# Patient Record
Sex: Female | Born: 1950 | Race: White | Hispanic: No | Smoking: Former smoker
Health system: Southern US, Community
[De-identification: ages and names within clinical notes are randomized; demographics above are authoritative.]

## PROBLEM LIST (undated history)

## (undated) DIAGNOSIS — I10 Essential (primary) hypertension: Secondary | ICD-10-CM

## (undated) DIAGNOSIS — R112 Nausea with vomiting, unspecified: Secondary | ICD-10-CM

## (undated) DIAGNOSIS — F32A Depression, unspecified: Secondary | ICD-10-CM

## (undated) DIAGNOSIS — Z9889 Other specified postprocedural states: Secondary | ICD-10-CM

## (undated) DIAGNOSIS — K219 Gastro-esophageal reflux disease without esophagitis: Secondary | ICD-10-CM

## (undated) DIAGNOSIS — M810 Age-related osteoporosis without current pathological fracture: Secondary | ICD-10-CM

## (undated) DIAGNOSIS — G43909 Migraine, unspecified, not intractable, without status migrainosus: Secondary | ICD-10-CM

## (undated) DIAGNOSIS — F329 Major depressive disorder, single episode, unspecified: Secondary | ICD-10-CM

## (undated) DIAGNOSIS — F419 Anxiety disorder, unspecified: Secondary | ICD-10-CM

## (undated) DIAGNOSIS — E785 Hyperlipidemia, unspecified: Secondary | ICD-10-CM

## (undated) DIAGNOSIS — Z972 Presence of dental prosthetic device (complete) (partial): Secondary | ICD-10-CM

## (undated) DIAGNOSIS — S82899A Other fracture of unspecified lower leg, initial encounter for closed fracture: Secondary | ICD-10-CM

## (undated) HISTORY — PX: MANDIBLE SURGERY: SHX707

## (undated) HISTORY — DX: Major depressive disorder, single episode, unspecified: F32.9

## (undated) HISTORY — DX: Anxiety disorder, unspecified: F41.9

## (undated) HISTORY — DX: Hyperlipidemia, unspecified: E78.5

## (undated) HISTORY — DX: Other fracture of unspecified lower leg, initial encounter for closed fracture: S82.899A

## (undated) HISTORY — DX: Migraine, unspecified, not intractable, without status migrainosus: G43.909

## (undated) HISTORY — DX: Age-related osteoporosis without current pathological fracture: M81.0

## (undated) HISTORY — DX: Essential (primary) hypertension: I10

## (undated) HISTORY — PX: ANKLE FRACTURE SURGERY: SHX122

## (undated) HISTORY — DX: Depression, unspecified: F32.A

---

## 2004-12-01 ENCOUNTER — Ambulatory Visit: Payer: Self-pay | Admitting: Internal Medicine

## 2008-08-24 ENCOUNTER — Ambulatory Visit: Payer: Self-pay | Admitting: Gastroenterology

## 2012-01-24 ENCOUNTER — Ambulatory Visit: Payer: Self-pay

## 2013-05-13 ENCOUNTER — Ambulatory Visit: Payer: Self-pay | Admitting: Family Medicine

## 2013-12-15 ENCOUNTER — Ambulatory Visit: Payer: Self-pay | Admitting: Ophthalmology

## 2013-12-15 DIAGNOSIS — Z01810 Encounter for preprocedural cardiovascular examination: Secondary | ICD-10-CM

## 2013-12-15 DIAGNOSIS — I10 Essential (primary) hypertension: Secondary | ICD-10-CM

## 2013-12-15 LAB — POTASSIUM: Potassium: 3.6 mmol/L (ref 3.5–5.1)

## 2014-01-02 HISTORY — PX: CATARACT EXTRACTION: SUR2

## 2014-01-06 ENCOUNTER — Ambulatory Visit: Payer: Self-pay | Admitting: Ophthalmology

## 2014-02-04 ENCOUNTER — Ambulatory Visit: Payer: Self-pay | Admitting: Gastroenterology

## 2014-02-04 HISTORY — PX: COLONOSCOPY: SHX174

## 2014-06-25 NOTE — Op Note (Signed)
PATIENT NAME:  Tabitha Larson, Tabitha Larson MR#:  [id number] DATE OF BIRTH:  [birth year]  DATE OF PROCEDURE:  01/06/2014  PREOPERATIVE DIAGNOSIS:  Nuclear sclerotic cataract, right eye. H25.11  POSTOPERATIVE DIAGNOSIS:  Nuclear sclerotic cataract, right eye.  PROCEDURE:  Phacoemulsification with posterior chamber intraocular lens right eye, model SN60WF  SURGEON:  Lyla Glassing, MD  INDICATIONS:  This is a 64 year old female with decreased vision in the right eye.  PROCEDURE:  The risks and benefits of cataract surgery were discussed at length with the patient, including bleeding, infection, retinal detachment, re-operation, diplopia, ptosis, loss of vision, and loss of the eye. Informed consent was obtained. On the day of surgery, several sets of preoperative medication were administered to the operative eye including 0.5% tetracaine, 1% cyclopentolate, 10% phenylephrine, 0.5% ketorolac, 0.5% gatifloxacin, and 2% lidocaine .  The patient was taken to the operating room and sedated via IV sedation. Topical tetracaine was placed in the eye. The operative eye was prepped using a 10% Betadine solution and then covered in sterile drapes leaving only the operative eye exposed. A Lieberman lid speculum was placed to provide exposure. Using 0.12 forceps and a sideport blade, a paracentesis was created. Then a mixture of BSS, preservative free lidocaine, and epinephrine was injected into the anterior chamber. Next, a 2.4 mm keratome blade was used to create a two-step full-thickness clear corneal incision temporally. The cystitome and Utrata forceps were used to create a continuous capsulorrhexis in the anterior lens capsule. BSS on a hydrodissection cannula was used to perform gentle hydrodissection. Phacoemulsification was then performed to remove the nucleus. Irrigation and aspiration was performed to remove the remaining cortical material. Provisc was injected to fill the capsular bag and anterior chamber. An 18.0  diopter SN60WF intraocular lens was injected into the capsular bag. The Connor wand was used to rotate it into proper position in the capsular bag. Irrigation and aspiration was performed to remove the remaining Viscoelastic material from the eye. BSS on a 30-gauge cannula was used to hydrate the wound. An intracameral antibiotic was administered. The wounds were checked and found to be watertight. The lid speculum and drapes were carefully removed. Several drops of Vigamox were placed in the operative eye. The eye was covered with protective eyewear. The patient was taken to the recovery area in good condition. There were no complications.   ____________________________ Lyla Glassing, MD nm:lt D: 01/06/2014 11:37:05 ET T: 01/06/2014 13:43:46 ET JOB#: [id number]  cc: Lyla Glassing, MD, <Dictator> Lyla Glassing MD ELECTRONICALLY SIGNED 01/13/2014 13:45

## 2014-06-30 ENCOUNTER — Encounter: Payer: Self-pay | Admitting: Unknown Physician Specialty

## 2014-06-30 DIAGNOSIS — E7800 Pure hypercholesterolemia, unspecified: Secondary | ICD-10-CM

## 2014-06-30 DIAGNOSIS — M810 Age-related osteoporosis without current pathological fracture: Secondary | ICD-10-CM | POA: Insufficient documentation

## 2014-06-30 DIAGNOSIS — I10 Essential (primary) hypertension: Secondary | ICD-10-CM | POA: Insufficient documentation

## 2014-06-30 DIAGNOSIS — F332 Major depressive disorder, recurrent severe without psychotic features: Secondary | ICD-10-CM

## 2014-06-30 DIAGNOSIS — G43909 Migraine, unspecified, not intractable, without status migrainosus: Secondary | ICD-10-CM | POA: Insufficient documentation

## 2014-06-30 DIAGNOSIS — G43709 Chronic migraine without aura, not intractable, without status migrainosus: Secondary | ICD-10-CM

## 2014-06-30 DIAGNOSIS — M8580 Other specified disorders of bone density and structure, unspecified site: Secondary | ICD-10-CM | POA: Insufficient documentation

## 2014-06-30 DIAGNOSIS — F329 Major depressive disorder, single episode, unspecified: Secondary | ICD-10-CM | POA: Insufficient documentation

## 2014-08-15 ENCOUNTER — Ambulatory Visit: Payer: Self-pay | Admitting: Unknown Physician Specialty

## 2014-12-20 ENCOUNTER — Telehealth: Payer: Self-pay

## 2014-12-20 NOTE — Telephone Encounter (Signed)
Called patient about colorectal screening and eye exam. Patient did not answer. Left voicemail.

## 2015-01-24 ENCOUNTER — Ambulatory Visit (INDEPENDENT_AMBULATORY_CARE_PROVIDER_SITE_OTHER): Payer: Medicare Other | Admitting: Unknown Physician Specialty

## 2015-01-24 ENCOUNTER — Encounter: Payer: Self-pay | Admitting: Unknown Physician Specialty

## 2015-01-24 VITALS — BP 114/72 | HR 65 | Temp 98.3°F | Ht 61.1 in | Wt 145.0 lb

## 2015-01-24 DIAGNOSIS — E7800 Pure hypercholesterolemia, unspecified: Secondary | ICD-10-CM | POA: Diagnosis not present

## 2015-01-24 DIAGNOSIS — Z23 Encounter for immunization: Secondary | ICD-10-CM | POA: Diagnosis not present

## 2015-01-24 DIAGNOSIS — I10 Essential (primary) hypertension: Secondary | ICD-10-CM

## 2015-01-24 DIAGNOSIS — F332 Major depressive disorder, recurrent severe without psychotic features: Secondary | ICD-10-CM | POA: Diagnosis not present

## 2015-01-24 DIAGNOSIS — R5383 Other fatigue: Secondary | ICD-10-CM | POA: Diagnosis not present

## 2015-01-24 LAB — LIPID PANEL PICCOLO, WAIVED
CHOLESTEROL PICCOLO, WAIVED: 185 mg/dL (ref <200)
Chol/HDL Ratio Piccolo,Waive: 4.3 mg/dL
HDL Chol Piccolo, Waived: 43 mg/dL — ABNORMAL LOW (ref >59)
LDL Chol Calc Piccolo Waived: 115 mg/dL — ABNORMAL HIGH (ref <100)
TRIGLYCERIDES PICCOLO,WAIVED: 133 mg/dL (ref <150)
VLDL Chol Calc Piccolo,Waive: 27 mg/dL (ref <30)

## 2015-01-24 LAB — MICROALBUMIN, URINE WAIVED
CREATININE, URINE WAIVED: 100 mg/dL (ref 10–300)
Microalb, Ur Waived: 30 mg/L — ABNORMAL HIGH (ref 0–19)

## 2015-01-24 MED ORDER — ATORVASTATIN CALCIUM 40 MG PO TABS: 40.0000 mg | ORAL_TABLET | Freq: Every day | ORAL | Status: DC

## 2015-01-24 MED ORDER — SIMVASTATIN 40 MG PO TABS: 40.0000 mg | ORAL_TABLET | Freq: Every day | ORAL | Status: DC

## 2015-01-24 MED ORDER — HYDROCHLOROTHIAZIDE 25 MG PO TABS: 25.0000 mg | ORAL_TABLET | Freq: Every day | ORAL | Status: DC

## 2015-01-24 MED ORDER — DULOXETINE HCL 30 MG PO CPEP: 30.0000 mg | ORAL_CAPSULE | Freq: Every day | ORAL | Status: DC

## 2015-01-24 MED ORDER — AMLODIPINE BESYLATE 5 MG PO TABS: 5.0000 mg | ORAL_TABLET | Freq: Every day | ORAL | Status: DC

## 2015-01-24 NOTE — Assessment & Plan Note (Addendum)
LDL is 114.  Change to Atorvastatin 40 mg due to being on Amlodipine and drug drug interactions

## 2015-01-24 NOTE — Progress Notes (Signed)
BP 114/72 mmHg  Pulse 65  Temp(Src) 98.3 F (36.8 C)  Ht 5' 1.1" (1.552 m)  Wt 145 lb (65.772 kg)  BMI 27.31 kg/m2  SpO2 97%  LMP  (LMP Unknown)   Subjective:    Patient ID: Tabitha Larson, female    DOB: 1950/11/08, 64 y.o.   MRN: [id number]  HPI: [name] is a 64 y.o. female  Chief Complaint  Patient presents with  . Depression  . Hyperlipidemia  . Hypertension  . Fatigue    pt states she feels tired all the time and it is hard for her to wake up  . Medication Refill    pt states she needs refills on all medications   Hypertension Using medications without difficulty Average home BPs   No problems or lightheadedness No chest pain with exertion or shortness of breath No Edema   Hyperlipidemia Using medications without problems: No Muscle aches  Diet compliance: good Exercise doesn't have the energy  Depression "I feel a lot better" but bothered by excessive fatigue.  Sleepy during the day.  She thinks she snores at night.  Sometimes wakes up not being able to breath.  Wakes up with a headache.  Goes away after a few minutes.    Relevant past medical, surgical, family and social history reviewed and updated as indicated. Interim medical history since our last visit reviewed. Allergies and medications reviewed and updated.  Review of Systems  Per HPI unless specifically indicated above     Objective:    BP 114/72 mmHg  Pulse 65  Temp(Src) 98.3 F (36.8 C)  Ht 5' 1.1" (1.552 m)  Wt 145 lb (65.772 kg)  BMI 27.31 kg/m2  SpO2 97%  LMP  (LMP Unknown)  Wt Readings from Last 3 Encounters:  01/24/15 145 lb (65.772 kg)  02/11/14 136 lb (61.689 kg)    Physical Exam  Constitutional: She is oriented to person, place, and time. She appears well-developed and well-nourished. No distress.  HENT:  Head: Normocephalic and atraumatic.  Eyes: Conjunctivae and lids are normal. Right eye exhibits no discharge. Left eye exhibits no discharge. No scleral  icterus.  Cardiovascular: Normal rate and regular rhythm.   Pulmonary/Chest: Effort normal and breath sounds normal. No respiratory distress.  Abdominal: Soft. Normal appearance and bowel sounds are normal. There is no splenomegaly or hepatomegaly.  Musculoskeletal: Normal range of motion.  Neurological: She is alert and oriented to person, place, and time.  Skin: Skin is intact. No rash noted. No pallor.  Psychiatric: She has a normal mood and affect. Her behavior is normal. Judgment and thought content normal.    Results for orders placed or performed in visit on 12/15/13  Potassium  Result Value Ref Range   Potassium 3.6 3.5-5.1 mmol/L      Assessment & Plan:   Problem List Items Addressed This Visit      Unprioritized   Major depression ([place])    Improved.  Continue Cymbalta      Relevant Medications   DULoxetine (CYMBALTA) 30 MG capsule   Benign hypertension - Primary    Stable, continue present medications.       Relevant Medications   amLODipine (NORVASC) 5 MG tablet   hydrochlorothiazide (HYDRODIURIL) 25 MG tablet   atorvastatin (LIPITOR) 40 MG tablet   Other Relevant Orders   Lipid Panel Piccolo, Waived   Microalbumin, Urine Waived   Uric acid   Comprehensive metabolic panel   Hypercholesterolemia    LDL is  114.  Change to Atorvastatin 40 mg due to being on Amlodipine and drug drug interactions      Relevant Medications   amLODipine (NORVASC) 5 MG tablet   hydrochlorothiazide (HYDRODIURIL) 25 MG tablet   atorvastatin (LIPITOR) 40 MG tablet   Other Relevant Orders   Lipid Panel Piccolo, Waived   Microalbumin, Urine Waived   Uric acid   Comprehensive metabolic panel    Other Visit Diagnoses    Immunization due        Relevant Orders    Flu Vaccine QUAD 36+ mos IM (Completed)    Other fatigue        Suspect sleep apnea.  Refer for sleep study    Relevant Orders    Ambulatory referral to Sleep Studies        Follow up plan: Return in about 3  months (around 04/26/2015) for physical.

## 2015-01-24 NOTE — Patient Instructions (Signed)
Stop Simvastatin and start Atorvastatin Check about sleep study

## 2015-01-24 NOTE — Assessment & Plan Note (Addendum)
Improved.  Continue Cymbalta. 

## 2015-01-24 NOTE — Assessment & Plan Note (Signed)
Stable, continue present medications.   

## 2015-01-25 ENCOUNTER — Encounter: Payer: Self-pay | Admitting: Unknown Physician Specialty

## 2015-01-25 LAB — COMPREHENSIVE METABOLIC PANEL
A/G RATIO: 2 (ref 1.1–2.5)
ALBUMIN: 4.6 g/dL (ref 3.6–4.8)
ALK PHOS: 64 IU/L (ref 39–117)
ALT: 18 IU/L (ref 0–32)
AST: 17 IU/L (ref 0–40)
BUN / CREAT RATIO: 26 (ref 11–26)
BUN: 15 mg/dL (ref 8–27)
Bilirubin Total: 0.2 mg/dL (ref 0.0–1.2)
CO2: 26 mmol/L (ref 18–29)
Calcium: 9.6 mg/dL (ref 8.7–10.3)
Chloride: 100 mmol/L (ref 97–106)
Creatinine, Ser: 0.58 mg/dL (ref 0.57–1.00)
GFR calc non Af Amer: 96 mL/min/{1.73_m2} (ref >59)
GFR, EST AFRICAN AMERICAN: 111 mL/min/{1.73_m2} (ref >59)
Globulin, Total: 2.3 g/dL (ref 1.5–4.5)
Glucose: 99 mg/dL (ref 65–99)
Potassium: 3.9 mmol/L (ref 3.5–5.2)
SODIUM: 141 mmol/L (ref 136–144)
TOTAL PROTEIN: 6.9 g/dL (ref 6.0–8.5)

## 2015-01-25 LAB — URIC ACID: Uric Acid: 4.2 mg/dL (ref 2.5–7.1)

## 2015-01-25 NOTE — Progress Notes (Signed)
Quick Note:  Normal labs. Patient notified by letter. ______ 

## 2015-05-01 ENCOUNTER — Ambulatory Visit (INDEPENDENT_AMBULATORY_CARE_PROVIDER_SITE_OTHER): Payer: Medicare Other | Admitting: Unknown Physician Specialty

## 2015-05-01 ENCOUNTER — Encounter: Payer: Self-pay | Admitting: Unknown Physician Specialty

## 2015-05-01 VITALS — BP 125/77 | HR 64 | Temp 97.6°F | Ht 61.1 in | Wt 142.0 lb

## 2015-05-01 DIAGNOSIS — I10 Essential (primary) hypertension: Secondary | ICD-10-CM

## 2015-05-01 DIAGNOSIS — Z780 Asymptomatic menopausal state: Secondary | ICD-10-CM

## 2015-05-01 DIAGNOSIS — E7800 Pure hypercholesterolemia, unspecified: Secondary | ICD-10-CM

## 2015-05-01 DIAGNOSIS — Z0000 Encounter for general adult medical examination without abnormal findings: Secondary | ICD-10-CM

## 2015-05-01 DIAGNOSIS — Z23 Encounter for immunization: Secondary | ICD-10-CM

## 2015-05-01 DIAGNOSIS — F332 Major depressive disorder, recurrent severe without psychotic features: Secondary | ICD-10-CM | POA: Diagnosis not present

## 2015-05-01 LAB — MICROALBUMIN, URINE WAIVED
CREATININE, URINE WAIVED: 200 mg/dL (ref 10–300)
Microalb, Ur Waived: 30 mg/L — ABNORMAL HIGH (ref 0–19)

## 2015-05-01 NOTE — Patient Instructions (Signed)
Please do call to schedule your mammogram; the number to schedule one at either [facility] or [facility] is [phone]   

## 2015-05-01 NOTE — Assessment & Plan Note (Signed)
Stable, continue present medications.   

## 2015-05-01 NOTE — Progress Notes (Signed)
BP 125/77 mmHg  Pulse 64  Temp(Src) 97.6 F (36.4 C)  Ht 5' 1.1" (1.552 m)  Wt 142 lb (64.411 kg)  BMI 26.74 kg/m2  SpO2 97%  LMP  (LMP Unknown)   Subjective:    Patient ID: Tabitha Larson, female    DOB: 1950/07/31, 65 y.o.   MRN: [id number]  HPI: Tabitha Larson is a 65 y.o. female  Chief Complaint  Patient presents with  . Medicare Wellness   Functional Status Survey: Is the patient deaf or have difficulty hearing?: No Does the patient have difficulty seeing, even when wearing glasses/contacts?: Yes (pt states she has had a cataract surgery, states she wears glasses to read and drive) Does the patient have difficulty concentrating, remembering, or making decisions?: Yes (pt states she has trouble concentrating sometimes) Does the patient have difficulty walking or climbing stairs?: No Does the patient have difficulty dressing or bathing?: No Does the patient have difficulty doing errands alone such as visiting a doctor's office or shopping?: No Fall Risk  05/01/2015 01/24/2015  Falls in the past year? No No   Depression screen [facility] 2/9 05/01/2015 01/24/2015  Decreased Interest 1 1  Down, Depressed, Hopeless 1 1  PHQ - 2 Score 2 2    Past Medical History  Diagnosis Date  . Hypertension   . Hyperlipidemia   . Depression   . Osteoporosis   . Migraine   . Anxiety   . Ankle fracture    Past Surgical History  Procedure Laterality Date  . Cataract extraction Right 01/2014  . Mandible surgery    . Ankle fracture surgery    . Colonoscopy  02/04/2014   Family History  Problem Relation Age of Onset  . Stroke Mother   . Cancer Father     Prostate  . Cancer Sister     colon  . Hypertension Sister   . Hyperlipidemia Sister    Hypertension Using medications without difficulty Average home BPs not checking  No problems or lightheadedness  No chest pain with exertion or shortness of breath No Edema   Hyperlipidemia Using medications without problems: No  Muscle aches  Diet compliance: Eating too much junk food.   Exercise: "not really"  Pt is able to perform complex mental tasks, recognize clock face, recognize time and do a 3 item recall.    Pt is planning to make advanced directions.  She would like her sister or her niece to make health care decisions.   Relevant past medical, surgical, family and social history reviewed and updated as indicated. Interim medical history since our last visit reviewed. Allergies and medications reviewed and updated.  Review of Systems  Constitutional: Negative.   HENT: Negative.   Eyes: Negative.   Respiratory: Negative.   Cardiovascular: Negative.   Gastrointestinal: Negative.        Noticed frequency and form changed.  Colonoscopy 1 year ago  Endocrine: Negative.   Genitourinary: Negative.   Musculoskeletal: Negative.   Skin: Negative.   Allergic/Immunologic: Negative.   Neurological: Negative.   Hematological: Negative.   Psychiatric/Behavioral: Negative.     Per HPI unless specifically indicated above     Objective:    BP 125/77 mmHg  Pulse 64  Temp(Src) 97.6 F (36.4 C)  Ht 5' 1.1" (1.552 m)  Wt 142 lb (64.411 kg)  BMI 26.74 kg/m2  SpO2 97%  LMP  (LMP Unknown)  Wt Readings from Last 3 Encounters:  05/01/15 142 lb (64.411 kg)  01/24/15 145  lb (65.772 kg)  02/11/14 136 lb (61.689 kg)    Physical Exam  Constitutional: She is oriented to person, place, and time. She appears well-developed and well-nourished.  HENT:  Head: Normocephalic and atraumatic.  Eyes: Pupils are equal, round, and reactive to light. Right eye exhibits no discharge. Left eye exhibits no discharge. No scleral icterus.  Neck: Normal range of motion. Neck supple. Carotid bruit is not present. No thyromegaly present.  Cardiovascular: Normal rate, regular rhythm and normal heart sounds.  Exam reveals no gallop and no friction rub.   No murmur heard. Pulmonary/Chest: Effort normal and breath sounds normal. No  respiratory distress. She has no wheezes. She has no rales.  Abdominal: Soft. Bowel sounds are normal. There is no tenderness. There is no rebound.  Genitourinary: No breast swelling, tenderness or discharge.  Musculoskeletal: Normal range of motion.  Lymphadenopathy:    She has no cervical adenopathy.  Neurological: She is alert and oriented to person, place, and time.  Skin: Skin is warm, dry and intact. No rash noted.  Psychiatric: She has a normal mood and affect. Her speech is normal and behavior is normal. Judgment and thought content normal. Cognition and memory are normal.    Results for orders placed or performed in visit on 01/24/15  Lipid Panel Piccolo, [employer]  Result Value Ref Range   Cholesterol Piccolo, Waived 185 <200 mg/dL   HDL Chol Piccolo, Waived 43 (L) >59 mg/dL   Triglycerides Piccolo,Waived 133 <150 mg/dL   Chol/HDL Ratio Piccolo,Waive 4.3 mg/dL   LDL Chol Calc Piccolo Waived 115 (H) <100 mg/dL   VLDL Chol Calc Piccolo,Waive 27 <30 mg/dL  Microalbumin, Urine Waived  Result Value Ref Range   Microalb, Ur Waived 30 (H) 0 - 19 mg/L   Creatinine, Urine Waived 100 10 - 300 mg/dL   Microalb/Creat Ratio 30-300 (H) <30 mg/g  Uric acid  Result Value Ref Range   Uric Acid 4.2 2.5 - 7.1 mg/dL  Comprehensive metabolic panel  Result Value Ref Range   Glucose 99 65 - 99 mg/dL   BUN 15 8 - 27 mg/dL   Creatinine, Ser 0.58 0.57 - 1.00 mg/dL   GFR calc non Af Amer 96 >59 mL/min/1.73   GFR calc Af Amer 111 >59 mL/min/1.73   BUN/Creatinine Ratio 26 11 - 26   Sodium 141 136 - 144 mmol/L   Potassium 3.9 3.5 - 5.2 mmol/L   Chloride 100 97 - 106 mmol/L   CO2 26 18 - 29 mmol/L   Calcium 9.6 8.7 - 10.3 mg/dL   Total Protein 6.9 6.0 - 8.5 g/dL   Albumin 4.6 3.6 - 4.8 g/dL   Globulin, Total 2.3 1.5 - 4.5 g/dL   Albumin/Globulin Ratio 2.0 1.1 - 2.5   Bilirubin Total 0.2 0.0 - 1.2 mg/dL   Alkaline Phosphatase 64 39 - 117 IU/L   AST 17 0 - 40 IU/L   ALT 18 0 - 32 IU/L       Assessment & Plan:   Problem List Items Addressed This Visit      Unprioritized   Major depression ([facility])    Stable, continue present medications.        Benign hypertension    Stable, continue present medications.        Relevant Orders   Comprehensive metabolic panel   Microalbumin, Urine Waived   Hypercholesterolemia    Stable, continue present medications.        Relevant Orders   Lipid Panel w/o  Chol/HDL Ratio    Other Visit Diagnoses    Need for pneumococcal vaccination    -  Primary    Relevant Orders    Pneumococcal polysaccharide vaccine 23-valent greater than or equal to 2yo subcutaneous/IM (Completed)    Annual physical exam        Relevant Orders    Hepatitis C antibody    Menopause        Relevant Orders    DG Bone Density       After discussion with patient she would like to be a no code and have her sister and niece make medical decisions for her  Follow up plan: Return in about 6 months (around 10/29/2015).

## 2015-05-02 ENCOUNTER — Encounter: Payer: Self-pay | Admitting: Unknown Physician Specialty

## 2015-05-02 LAB — COMPREHENSIVE METABOLIC PANEL
A/G RATIO: 1.7 (ref 1.1–2.5)
ALT: 19 IU/L (ref 0–32)
AST: 15 IU/L (ref 0–40)
Albumin: 4.3 g/dL (ref 3.6–4.8)
Alkaline Phosphatase: 79 IU/L (ref 39–117)
BILIRUBIN TOTAL: 0.3 mg/dL (ref 0.0–1.2)
BUN / CREAT RATIO: 23 (ref 11–26)
BUN: 15 mg/dL (ref 8–27)
CHLORIDE: 99 mmol/L (ref 96–106)
CO2: 28 mmol/L (ref 18–29)
Calcium: 9.8 mg/dL (ref 8.7–10.3)
Creatinine, Ser: 0.66 mg/dL (ref 0.57–1.00)
GFR calc Af Amer: 106 mL/min/{1.73_m2} (ref >59)
GFR calc non Af Amer: 92 mL/min/{1.73_m2} (ref >59)
Globulin, Total: 2.6 g/dL (ref 1.5–4.5)
Glucose: 97 mg/dL (ref 65–99)
POTASSIUM: 3.9 mmol/L (ref 3.5–5.2)
SODIUM: 144 mmol/L (ref 134–144)
Total Protein: 6.9 g/dL (ref 6.0–8.5)

## 2015-05-02 LAB — LIPID PANEL W/O CHOL/HDL RATIO
Cholesterol, Total: 172 mg/dL (ref 100–199)
HDL: 44 mg/dL (ref >39)
LDL Calculated: 109 mg/dL — ABNORMAL HIGH (ref 0–99)
Triglycerides: 97 mg/dL (ref 0–149)
VLDL CHOLESTEROL CAL: 19 mg/dL (ref 5–40)

## 2015-05-02 LAB — HEPATITIS C ANTIBODY

## 2015-06-13 ENCOUNTER — Telehealth: Payer: Self-pay | Admitting: Unknown Physician Specialty

## 2015-06-13 NOTE — Telephone Encounter (Signed)
Pt would like to go to gastro for irregular bowel movements. She had a colonoscopy done 02/2015.

## 2015-06-14 ENCOUNTER — Ambulatory Visit (INDEPENDENT_AMBULATORY_CARE_PROVIDER_SITE_OTHER): Payer: Medicare Other | Admitting: Unknown Physician Specialty

## 2015-06-14 ENCOUNTER — Encounter: Payer: Self-pay | Admitting: Unknown Physician Specialty

## 2015-06-14 ENCOUNTER — Telehealth: Payer: Self-pay | Admitting: Gastroenterology

## 2015-06-14 VITALS — BP 120/74 | HR 62 | Temp 97.9°F | Ht 61.0 in | Wt 143.4 lb

## 2015-06-14 DIAGNOSIS — K589 Irritable bowel syndrome without diarrhea: Secondary | ICD-10-CM | POA: Diagnosis not present

## 2015-06-14 NOTE — Progress Notes (Signed)
   BP 120/74 mmHg  Pulse 62  Temp(Src) 97.9 F (36.6 C)  Ht 5\' 1"  (1.549 m)  Wt 143 lb 6.4 oz (65.046 kg)  BMI 27.11 kg/m2  SpO2 97%  LMP  (LMP Unknown)   Subjective:    Patient ID: Tabitha Larson, female    DOB: Apr 11, 1950, 65 y.o.   MRN: [id number]  HPI: Tabitha Larson is a 65 y.o. female  Chief Complaint  Patient presents with  . irregular bowel movements    pt states she has been having constipation and diarrhea for a few months now. She states there is not much in between the diarrhea and constipation    Constipation This is a new problem. The current episode started more than 1 month ago. The problem is unchanged. Her stool frequency is 2 to 3 times per week. The stool is described as loose and pencil thin. The patient is not on a high fiber diet. She exercises regularly. There has not been adequate water intake. Associated symptoms include back pain, diarrhea, flatus, hemorrhoids and rectal pain. Pertinent negatives include no abdominal pain, fever, melena, nausea, vomiting or weight loss. Associated symptoms comments: Fatigue . She has tried laxatives for the symptoms. The treatment provided no relief (just gave patient diarrhea).    Relevant past medical, surgical, family and social history reviewed and updated as indicated. Interim medical history since our last visit reviewed. Allergies and medications reviewed and updated.  Review of Systems  Constitutional: Negative for fever and weight loss.  Gastrointestinal: Positive for diarrhea, constipation, rectal pain, flatus and hemorrhoids. Negative for nausea, vomiting, abdominal pain and melena.  Musculoskeletal: Positive for back pain.    Per HPI unless specifically indicated above     Objective:    BP 120/74 mmHg  Pulse 62  Temp(Src) 97.9 F (36.6 C)  Ht 5\' 1"  (1.549 m)  Wt 143 lb 6.4 oz (65.046 kg)  BMI 27.11 kg/m2  SpO2 97%  LMP  (LMP Unknown)  Wt Readings from Last 3 Encounters:  06/14/15 143 lb  6.4 oz (65.046 kg)  05/01/15 142 lb (64.411 kg)  01/24/15 145 lb (65.772 kg)    Physical Exam  Constitutional: She is oriented to person, place, and time. She appears well-developed and well-nourished. No distress.  HENT:  Head: Normocephalic and atraumatic.  Eyes: Conjunctivae and lids are normal. Right eye exhibits no discharge. Left eye exhibits no discharge. No scleral icterus.  Cardiovascular: Normal rate.   Pulmonary/Chest: Effort normal.  Abdominal: Normal appearance. There is no splenomegaly or hepatomegaly.  Musculoskeletal: Normal range of motion.  Neurological: She is alert and oriented to person, place, and time.  Skin: Skin is intact. No rash noted. No pallor.  Psychiatric: She has a normal mood and affect. Her behavior is normal. Judgment and thought content normal.        Assessment & Plan:   Problem List Items Addressed This Visit    None    Visit Diagnoses    IBS (irritable bowel syndrome)    -  Primary    CBC, CMP, Celiac panel pending. IBS info given to patient. Referral back to Gastroenterology as this is a new problem.    Relevant Orders    Comprehensive metabolic panel    CBC with Differential/Platelet    Tissue transglutaminase, IgA    Ambulatory referral to Gastroenterology        Follow up plan: Return if symptoms worsen or fail to improve.

## 2015-06-14 NOTE — Telephone Encounter (Signed)
I have called patient to make an appointment with GI per referral for IBS (irritable bowel syndrome). No answer. I have left a detailed message.

## 2015-06-14 NOTE — Patient Instructions (Addendum)
Irritable Bowel Syndrome, Adult Irritable bowel syndrome (IBS) is not one specific disease. It is a group of symptoms that affects the organs responsible for digestion (gastrointestinal or GI tract).  To regulate how your GI tract works, your body sends signals back and forth between your intestines and your brain. If you have IBS, there may be a problem with these signals. As a result, your GI tract does not function normally. Your intestines may become more sensitive and overreact to certain things. This is especially true when you eat certain foods or when you are under stress.  There are four types of IBS. These may be determined based on the consistency of your stool:   IBS with diarrhea.   IBS with constipation.   Mixed IBS.   Unsubtyped IBS.  It is important to know which type of IBS you have. Some treatments are more likely to be helpful for certain types of IBS.  CAUSES  The exact cause of IBS is not known. RISK FACTORS You may have a higher risk of IBS if:  You are a woman.  You are younger than 67 years old.  You have a family history of IBS.  You have mental health problems.  You have had bacterial infection of your GI tract. SIGNS AND SYMPTOMS  Symptoms of IBS vary from person to person. The main symptom is abdominal pain or discomfort. Additional symptoms usually include one or more of the following:   Diarrhea, constipation, or both.   Abdominal swelling or bloating.   Feeling full or sick after eating a small or regular-size meal.   Frequent gas.   Mucus in the stool.   A feeling of having more stool left after a bowel movement.  Symptoms tend to come and go. They may be associated with stress, psychiatric conditions, or nothing at all.  DIAGNOSIS  There is no specific test to diagnose IBS. Your health care provider will make a diagnosis based on a physical exam, medical history, and your symptoms. You may have other tests to rule out other  conditions that may be causing your symptoms. These may include:   Blood tests.   X-rays.   CT scan.  Endoscopy and colonoscopy. This is a test in which your GI tract is viewed with a long, thin, flexible tube. TREATMENT There is no cure for IBS, but treatment can help relieve symptoms. IBS treatment often includes:   Changes to your diet, such as:  Eating more fiber.  Avoiding foods that cause symptoms.  Drinking more water.  Eating regular, medium-sized portioned meals.  Medicines. These may include:  Fiber supplements if you have constipation.  Medicine to control diarrhea (antidiarrheal medicines).  Medicine to help control muscle spasms in your GI tract (antispasmodic medicines).  Medicines to help with any mental health issues, such as antidepressants or tranquilizers.  Therapy.  Talk therapy may help with anxiety, depression, or other mental health issues that can make IBS symptoms worse.  Stress reduction.  Managing your stress can help keep symptoms under control. HOME CARE INSTRUCTIONS   Take medicines only as directed by your health care provider.  Eat a healthy diet.  Avoid foods and drinks with added sugar.  Include more whole grains, fruits, and vegetables gradually into your diet. This may be especially helpful if you have IBS with constipation.  Avoid any foods and drinks that make your symptoms worse. These may include dairy products and caffeinated or carbonated drinks.  Do not eat large meals.    Drink enough fluid to keep your urine clear or pale yellow.  Exercise regularly. Ask your health care provider for recommendations of good activities for you.  Keep all follow-up visits as directed by your health care provider. This is important. SEEK MEDICAL CARE IF:   You have constant pain.  You have trouble or pain with swallowing.  You have worsening diarrhea. SEEK IMMEDIATE MEDICAL CARE IF:   You have severe and worsening abdominal  pain.   You have diarrhea and:   You have a rash, stiff neck, or severe headache.   You are irritable, sleepy, or difficult to awaken.   You are weak, dizzy, or extremely thirsty.   You have bright red blood in your stool or you have black tarry stools.   You have unusual abdominal swelling that is painful.   You vomit continuously.   You vomit blood (hematemesis).   You have both abdominal pain and a fever.    This information is not intended to replace advice given to you by your health care provider. Make sure you discuss any questions you have with your health care provider.  There is pro-biotic called Align you can get at the grocery or drug store.

## 2015-06-15 LAB — CBC WITH DIFFERENTIAL/PLATELET
BASOS: 0 %
Basophils Absolute: 0 10*3/uL (ref 0.0–0.2)
EOS (ABSOLUTE): 0.1 10*3/uL (ref 0.0–0.4)
EOS: 1 %
HEMATOCRIT: 41.6 % (ref 34.0–46.6)
HEMOGLOBIN: 13.4 g/dL (ref 11.1–15.9)
Immature Grans (Abs): 0 10*3/uL (ref 0.0–0.1)
Immature Granulocytes: 0 %
LYMPHS ABS: 1.9 10*3/uL (ref 0.7–3.1)
Lymphs: 30 %
MCH: 28.3 pg (ref 26.6–33.0)
MCHC: 32.2 g/dL (ref 31.5–35.7)
MCV: 88 fL (ref 79–97)
MONOCYTES: 5 %
Monocytes Absolute: 0.3 10*3/uL (ref 0.1–0.9)
NEUTROS ABS: 4 10*3/uL (ref 1.4–7.0)
Neutrophils: 64 %
Platelets: 216 10*3/uL (ref 150–379)
RBC: 4.74 x10E6/uL (ref 3.77–5.28)
RDW: 15.2 % (ref 12.3–15.4)
WBC: 6.2 10*3/uL (ref 3.4–10.8)

## 2015-06-15 LAB — COMPREHENSIVE METABOLIC PANEL
ALBUMIN: 4.3 g/dL (ref 3.6–4.8)
ALT: 20 IU/L (ref 0–32)
AST: 18 IU/L (ref 0–40)
Albumin/Globulin Ratio: 1.6 (ref 1.2–2.2)
Alkaline Phosphatase: 75 IU/L (ref 39–117)
BUN / CREAT RATIO: 26 (ref 12–28)
BUN: 17 mg/dL (ref 8–27)
Bilirubin Total: 0.4 mg/dL (ref 0.0–1.2)
CO2: 25 mmol/L (ref 18–29)
CREATININE: 0.66 mg/dL (ref 0.57–1.00)
Calcium: 9.4 mg/dL (ref 8.7–10.3)
Chloride: 98 mmol/L (ref 96–106)
GFR, EST AFRICAN AMERICAN: 106 mL/min/{1.73_m2} (ref >59)
GFR, EST NON AFRICAN AMERICAN: 92 mL/min/{1.73_m2} (ref >59)
GLOBULIN, TOTAL: 2.7 g/dL (ref 1.5–4.5)
GLUCOSE: 105 mg/dL — AB (ref 65–99)
Potassium: 3.3 mmol/L — ABNORMAL LOW (ref 3.5–5.2)
SODIUM: 141 mmol/L (ref 134–144)
TOTAL PROTEIN: 7 g/dL (ref 6.0–8.5)

## 2015-06-15 LAB — TISSUE TRANSGLUTAMINASE, IGA: Transglutaminase IgA: <2 U/mL (ref 0–3)

## 2015-06-15 NOTE — Telephone Encounter (Signed)
Appointment has been made on 07/12/15 with Dr Allen Norris.

## 2015-06-16 ENCOUNTER — Encounter: Payer: Self-pay | Admitting: Unknown Physician Specialty

## 2015-06-16 NOTE — Progress Notes (Signed)
Quick Note:  Normal labs. Patient notified by letter. ______ 

## 2015-07-12 ENCOUNTER — Encounter: Payer: Self-pay | Admitting: Gastroenterology

## 2015-07-12 ENCOUNTER — Ambulatory Visit (INDEPENDENT_AMBULATORY_CARE_PROVIDER_SITE_OTHER): Payer: Medicare Other | Admitting: Gastroenterology

## 2015-07-12 VITALS — BP 126/77 | HR 57 | Temp 98.2°F | Ht 62.0 in | Wt 145.0 lb

## 2015-07-12 DIAGNOSIS — R194 Change in bowel habit: Secondary | ICD-10-CM | POA: Diagnosis not present

## 2015-07-12 NOTE — Progress Notes (Signed)
   Primary Care Physician: Kathrine Haddock, NP  Primary Gastroenterologist:  Dr. Lucilla Lame  Chief Complaint  Patient presents with  . Irritable Bowel Syndrome  . Change in bowel habits    HPI: Tabitha Larson is a 65 y.o. female here for a change in bowel habits. The patient reports that she has been having alternating diarrhea and constipation. This is been present for the last four months. The patient has a family history of colon cancer and has had polyps in the past that were adenomatous. The patient last colonoscopy was in 2014. She denies any unexplained weight loss, black stools or bloody stools. She does report that her stools have become pencil thin. The patient also denies any abdominal pain associated with her symptoms.  Current Outpatient Prescriptions  Medication Sig Dispense Refill  . amLODipine (NORVASC) 5 MG tablet Take 1 tablet (5 mg total) by mouth daily. 90 tablet 1  . atorvastatin (LIPITOR) 40 MG tablet Take 1 tablet (40 mg total) by mouth daily. Discontinue Simvastatin 90 tablet 1  . DULoxetine (CYMBALTA) 30 MG capsule Take 1 capsule (30 mg total) by mouth daily. 90 capsule 1  . hydrochlorothiazide (HYDRODIURIL) 25 MG tablet Take 1 tablet (25 mg total) by mouth daily. 90 tablet 1   No current facility-administered medications for this visit.    Allergies as of 07/12/2015  . (No Known Allergies)    ROS:  General: Negative for anorexia, weight loss, fever, chills, fatigue, weakness. ENT: Negative for hoarseness, difficulty swallowing , nasal congestion. CV: Negative for chest pain, angina, palpitations, dyspnea on exertion, peripheral edema.  Respiratory: Negative for dyspnea at rest, dyspnea on exertion, cough, sputum, wheezing.  GI: See history of present illness. GU:  Negative for dysuria, hematuria, urinary incontinence, urinary frequency, nocturnal urination.  Endo: Negative for unusual weight change.    Physical Examination:   BP 126/77 mmHg  Pulse 57   Temp(Src) 98.2 F (36.8 C) (Oral)  Ht 5\' 2"  (1.575 m)  Wt 145 lb (65.772 kg)  BMI 26.51 kg/m2  LMP  (LMP Unknown)  General: Well-nourished, well-developed in no acute distress.  Eyes: No icterus. Conjunctivae pink. Mouth: Oropharyngeal mucosa moist and pink , no lesions erythema or exudate. Lungs: Clear to auscultation bilaterally. Non-labored. Heart: Regular rate and rhythm, no murmurs rubs or gallops.  Abdomen: Bowel sounds are normal, nontender, nondistended, no hepatosplenomegaly or masses, no abdominal bruits or hernia , no rebound or guarding.   Extremities: No lower extremity edema. No clubbing or deformities. Neuro: Alert and oriented x 3.  Grossly intact. Skin: Warm and dry, no jaundice.   Psych: Alert and cooperative, normal mood and affect.  Labs:    Imaging Studies: No results found.  Assessment and Plan:   Tabitha Larson is a 65 y.o. y/o female who has a change in bowel habits and a family history of colon cancer any personal history of colon polyps. The patient will be set up for a colonoscopy to evaluate her colon for a possible cause of her symptoms. I have discussed risks & benefits which include, but are not limited to, bleeding, infection, perforation & drug reaction.  The patient agrees with this plan & written consent will be obtained.      Note: This dictation was prepared with Dragon dictation along with smaller phrase technology. Any transcriptional errors that result from this process are unintentional.

## 2015-07-24 ENCOUNTER — Other Ambulatory Visit: Payer: Self-pay

## 2015-07-25 ENCOUNTER — Encounter: Payer: Self-pay | Admitting: *Deleted

## 2015-07-25 ENCOUNTER — Encounter: Payer: Self-pay | Admitting: Anesthesiology

## 2015-07-27 NOTE — Discharge Instructions (Signed)

## 2015-07-28 ENCOUNTER — Ambulatory Visit
Admission: RE | Admit: 2015-07-28 | Discharge: 2015-07-28 | Disposition: A | Discharge status: Home / Self Care | Payer: Medicare Other | Source: Ambulatory Visit | Attending: Gastroenterology | Admitting: Gastroenterology

## 2015-07-28 ENCOUNTER — Encounter
Admission: RE | Disposition: A | Discharge status: Home / Self Care | Payer: Self-pay | Source: Ambulatory Visit | Attending: Gastroenterology

## 2015-07-28 DIAGNOSIS — I10 Essential (primary) hypertension: Secondary | ICD-10-CM | POA: Insufficient documentation

## 2015-07-28 DIAGNOSIS — Z8249 Family history of ischemic heart disease and other diseases of the circulatory system: Secondary | ICD-10-CM | POA: Diagnosis not present

## 2015-07-28 DIAGNOSIS — E785 Hyperlipidemia, unspecified: Secondary | ICD-10-CM | POA: Insufficient documentation

## 2015-07-28 DIAGNOSIS — Z79899 Other long term (current) drug therapy: Secondary | ICD-10-CM | POA: Diagnosis not present

## 2015-07-28 DIAGNOSIS — F329 Major depressive disorder, single episode, unspecified: Secondary | ICD-10-CM | POA: Diagnosis not present

## 2015-07-28 DIAGNOSIS — D120 Benign neoplasm of cecum: Secondary | ICD-10-CM | POA: Insufficient documentation

## 2015-07-28 DIAGNOSIS — Z823 Family history of stroke: Secondary | ICD-10-CM | POA: Diagnosis not present

## 2015-07-28 DIAGNOSIS — Z87891 Personal history of nicotine dependence: Secondary | ICD-10-CM | POA: Diagnosis not present

## 2015-07-28 DIAGNOSIS — D123 Benign neoplasm of transverse colon: Secondary | ICD-10-CM | POA: Diagnosis not present

## 2015-07-28 DIAGNOSIS — Z9889 Other specified postprocedural states: Secondary | ICD-10-CM | POA: Insufficient documentation

## 2015-07-28 DIAGNOSIS — F419 Anxiety disorder, unspecified: Secondary | ICD-10-CM | POA: Insufficient documentation

## 2015-07-28 DIAGNOSIS — R194 Change in bowel habit: Secondary | ICD-10-CM | POA: Diagnosis present

## 2015-07-28 DIAGNOSIS — Z8349 Family history of other endocrine, nutritional and metabolic diseases: Secondary | ICD-10-CM | POA: Insufficient documentation

## 2015-07-28 DIAGNOSIS — Z800 Family history of malignant neoplasm of digestive organs: Secondary | ICD-10-CM | POA: Diagnosis not present

## 2015-07-28 DIAGNOSIS — Z8042 Family history of malignant neoplasm of prostate: Secondary | ICD-10-CM | POA: Diagnosis not present

## 2015-07-28 DIAGNOSIS — G43909 Migraine, unspecified, not intractable, without status migrainosus: Secondary | ICD-10-CM | POA: Diagnosis not present

## 2015-07-28 DIAGNOSIS — Z9841 Cataract extraction status, right eye: Secondary | ICD-10-CM | POA: Insufficient documentation

## 2015-07-28 DIAGNOSIS — K219 Gastro-esophageal reflux disease without esophagitis: Secondary | ICD-10-CM | POA: Insufficient documentation

## 2015-07-28 HISTORY — PX: COLONOSCOPY WITH PROPOFOL: SHX5780

## 2015-07-28 HISTORY — DX: Gastro-esophageal reflux disease without esophagitis: K21.9

## 2015-07-28 HISTORY — DX: Other specified postprocedural states: Z98.890

## 2015-07-28 HISTORY — DX: Nausea with vomiting, unspecified: R11.2

## 2015-07-28 HISTORY — PX: POLYPECTOMY: SHX5525

## 2015-07-28 HISTORY — DX: Presence of dental prosthetic device (complete) (partial): Z97.2

## 2015-07-28 SURGERY — COLONOSCOPY WITH PROPOFOL
Anesthesia: Choice | Wound class: Contaminated

## 2015-07-28 MED ORDER — STERILE WATER FOR IRRIGATION IR SOLN
Status: DC | PRN
  Administered 2015-07-28: 12:00:00

## 2015-07-28 SURGICAL SUPPLY — 22 items
CANISTER SUCT 1200ML W/VALVE (MISCELLANEOUS) ×4 IMPLANT
CLIP HMST 235XBRD CATH ROT (MISCELLANEOUS) IMPLANT
CLIP RESOLUTION 360 11X235 (MISCELLANEOUS)
FCP ESCP3.2XJMB 240X2.8X (MISCELLANEOUS)
FORCEPS BIOP RAD 4 LRG CAP 4 (CUTTING FORCEPS) ×4 IMPLANT
FORCEPS BIOP RJ4 240 W/NDL (MISCELLANEOUS)
FORCEPS ESCP3.2XJMB 240X2.8X (MISCELLANEOUS) IMPLANT
GOWN CVR UNV OPN BCK APRN NK (MISCELLANEOUS) ×4 IMPLANT
GOWN ISOL THUMB LOOP REG UNIV (MISCELLANEOUS) ×4
INJECTOR VARIJECT VIN23 (MISCELLANEOUS) IMPLANT
KIT DEFENDO VALVE AND CONN (KITS) IMPLANT
KIT ENDO PROCEDURE OLY (KITS) ×4 IMPLANT
MARKER SPOT ENDO TATTOO 5ML (MISCELLANEOUS) IMPLANT
PAD GROUND ADULT SPLIT (MISCELLANEOUS) IMPLANT
PROBE APC STR FIRE (PROBE) IMPLANT
SNARE SHORT THROW 13M SML OVAL (MISCELLANEOUS) IMPLANT
SNARE SHORT THROW 30M LRG OVAL (MISCELLANEOUS) IMPLANT
SNARE SNG USE RND 15MM (INSTRUMENTS) IMPLANT
SPOT EX ENDOSCOPIC TATTOO (MISCELLANEOUS)
TRAP ETRAP POLY (MISCELLANEOUS) IMPLANT
VARIJECT INJECTOR VIN23 (MISCELLANEOUS)
WATER STERILE IRR 250ML POUR (IV SOLUTION) ×4 IMPLANT

## 2015-07-28 NOTE — OR Nursing (Signed)
preop vital signs-  Pulse 64, O2 98, BP 144/72  Postop vital signs- Pulse 67, O2 100, BP 91/76

## 2015-07-28 NOTE — H&P (Signed)
  [facility]  [address] [place], [place] [address] Phone: [phone] Fax : [phone]  Primary Care Physician:  Kathrine Haddock, NP Primary Gastroenterologist:  Dr. Allen Norris  Pre-Procedure History & Physical: HPI:  Tabitha Larson is a 65 y.o. female is here for an colonoscopy.   Past Medical History  Diagnosis Date  . Hypertension   . Hyperlipidemia   . Depression   . Osteoporosis   . Migraine   . Anxiety   . Ankle fracture   . PONV (postoperative nausea and vomiting)     after ankle repair   . Wears dentures     full upper  . GERD (gastroesophageal reflux disease)     Past Surgical History  Procedure Laterality Date  . Cataract extraction Right 01/2014  . Mandible surgery    . Ankle fracture surgery    . Colonoscopy  02/04/2014    Prior to Admission medications   Medication Sig Start Date End Date Taking? Authorizing Provider  amLODipine (NORVASC) 5 MG tablet Take 1 tablet (5 mg total) by mouth daily. 01/24/15  Yes Kathrine Haddock, NP  atorvastatin (LIPITOR) 40 MG tablet Take 1 tablet (40 mg total) by mouth daily. Discontinue Simvastatin 01/24/15  Yes Kathrine Haddock, NP  DULoxetine (CYMBALTA) 30 MG capsule Take 1 capsule (30 mg total) by mouth daily. 01/24/15  Yes Kathrine Haddock, NP  hydrochlorothiazide (HYDRODIURIL) 25 MG tablet Take 1 tablet (25 mg total) by mouth daily. 01/24/15  Yes Kathrine Haddock, NP    Allergies as of 07/24/2015  . (No Known Allergies)    Family History  Problem Relation Age of Onset  . Stroke Mother   . Cancer Father     Prostate  . Cancer Sister     colon  . Hypertension Sister   . Hyperlipidemia Sister     Social History   Social History  . Marital Status: Single    Spouse Name: N/A  . Number of Children: N/A  . Years of Education: N/A   Occupational History  . Not on file.   Social History Main Topics  . Smoking status: Former Smoker    Types: Cigarettes    Quit date: 01/05/1984  . Smokeless  tobacco: Never Used  . Alcohol Use: No  . Drug Use: No  . Sexual Activity: Not Currently    Birth Control/ Protection: Post-menopausal   Other Topics Concern  . Not on file   Social History Narrative    Review of Systems: See HPI, otherwise negative ROS  Physical Exam: BP 115/72 mmHg  Pulse 63  Temp(Src) 98.8 F (37.1 C) (Temporal)  Resp 16  Ht 5\' 2"  (1.575 m)  Wt 140 lb 11.2 oz (63.821 kg)  BMI 25.73 kg/m2  SpO2 100%  LMP  (LMP Unknown) General:   Alert,  pleasant and cooperative in NAD Head:  Normocephalic and atraumatic. Neck:  Supple; no masses or thyromegaly. Lungs:  Clear throughout to auscultation.    Heart:  Regular rate and rhythm. Abdomen:  Soft, nontender and nondistended. Normal bowel sounds, without guarding, and without rebound.   Neurologic:  Alert and  oriented x4;  grossly normal neurologically.  Impression/Plan: Tabitha Larson is here for an colonoscopy to be performed for change in bowel habits  Risks, benefits, limitations, and alternatives regarding  colonoscopy have been reviewed with the patient.  Questions have been answered.  All parties agreeable.   Lucilla Lame, MD  07/28/2015, 11:10 AM

## 2015-07-28 NOTE — Op Note (Signed)
[facility] Patient Name: Tabitha Larson Procedure Date: 07/28/2015 12:13 PM MRN: [id number] Account #: [id number] Date of Birth: January 15, 1951 Admit Type: Outpatient Age: 65 Room: [facility] OR ROOM 01 Gender: Female Note Status: Finalized Procedure:            Colonoscopy Indications:          Change in bowel habits Providers:            Lucilla Lame, MD Referring MD:         Kathrine Haddock, PA (Referring MD) Medicines:            Propofol per Anesthesia Complications:        No immediate complications. Procedure:            Pre-Anesthesia Assessment:                       - Prior to the procedure, a History and Physical was                        performed, and patient medications and allergies were                        reviewed. The patient's tolerance of previous                        anesthesia was also reviewed. The risks and benefits of                        the procedure and the sedation options and risks were                        discussed with the patient. All questions were                        answered, and informed consent was obtained. Prior                        Anticoagulants: The patient has taken no previous                        anticoagulant or antiplatelet agents. ASA Grade                        Assessment: II - A patient with mild systemic disease.                        After reviewing the risks and benefits, the patient was                        deemed in satisfactory condition to undergo the                        procedure.                       After obtaining informed consent, the colonoscope was                        passed under direct vision. Throughout the procedure,  the patient's blood pressure, pulse, and oxygen                        saturations were monitored continuously. The Olympus                        CF-HQ190L Colonoscope (S#. [id number]) was introduced                         through the anus and advanced to the the cecum,                        identified by appendiceal orifice and ileocecal valve.                        The colonoscopy was performed without difficulty. The                        patient tolerated the procedure well. The quality of                        the bowel preparation was excellent. Findings:      The perianal and digital rectal examinations were normal.      A 4 mm polyp was found in the cecum. The polyp was sessile. The polyp       was removed with a cold biopsy forceps. Resection and retrieval were       complete.      A 3 mm polyp was found in the transverse colon. The polyp was sessile.       The polyp was removed with a cold biopsy forceps. Resection and       retrieval were complete. Impression:           - One 4 mm polyp in the cecum, removed with a cold                        biopsy forceps. Resected and retrieved.                       - One 3 mm polyp in the transverse colon, removed with                        a cold biopsy forceps. Resected and retrieved. Recommendation:       - Await pathology results.                       - Repeat colonoscopy in 5 years if polyp adenoma and 10                        years if hyperplastic Procedure Code(s):    --- Professional ---                       [phone], Colonoscopy, flexible; with biopsy, single or                        multiple Diagnosis Code(s):    --- Professional ---                       R19.4, Change in bowel habit  D12.3, Benign neoplasm of transverse colon (hepatic                        flexure or splenic flexure)                       D12.0, Benign neoplasm of cecum CPT copyright 2016 American Medical Association. All rights reserved. The codes documented in this report are preliminary and upon coder review may  be revised to meet current compliance requirements. Lucilla Lame, MD 07/28/2015 12:32:05 PM This report has been signed  electronically. Number of Addenda: 0 Note Initiated On: 07/28/2015 12:13 PM Scope Withdrawal Time: 0 hours 8 minutes 35 seconds  Total Procedure Duration: 0 hours 13 minutes 37 seconds       [facility]

## 2015-07-31 ENCOUNTER — Encounter: Payer: Self-pay | Admitting: Gastroenterology

## 2015-08-03 ENCOUNTER — Encounter: Payer: Self-pay | Admitting: Gastroenterology

## 2015-09-25 ENCOUNTER — Other Ambulatory Visit: Payer: Self-pay | Admitting: Unknown Physician Specialty

## 2015-09-25 NOTE — Telephone Encounter (Signed)
Your patient 

## 2015-10-31 ENCOUNTER — Ambulatory Visit (INDEPENDENT_AMBULATORY_CARE_PROVIDER_SITE_OTHER): Payer: Medicare Other | Admitting: Unknown Physician Specialty

## 2015-10-31 ENCOUNTER — Encounter: Payer: Self-pay | Admitting: Unknown Physician Specialty

## 2015-10-31 DIAGNOSIS — E876 Hypokalemia: Secondary | ICD-10-CM | POA: Diagnosis not present

## 2015-10-31 DIAGNOSIS — F332 Major depressive disorder, recurrent severe without psychotic features: Secondary | ICD-10-CM

## 2015-10-31 DIAGNOSIS — I10 Essential (primary) hypertension: Secondary | ICD-10-CM | POA: Diagnosis not present

## 2015-10-31 NOTE — Assessment & Plan Note (Signed)
Stable, continue present medications.   

## 2015-10-31 NOTE — Assessment & Plan Note (Signed)
Check CMP.  ?

## 2015-10-31 NOTE — Progress Notes (Signed)
   BP 132/83 (BP Location: Left Arm, Patient Position: Sitting, Cuff Size: Normal)   Pulse (!) 56   Temp 98.1 F (36.7 C)   Ht 5' 1.2" (1.554 m)   Wt 141 lb 3.2 oz (64 kg)   LMP  (LMP Unknown)   SpO2 96%   BMI 26.51 kg/m    Subjective:    Patient ID: Tabitha Larson, female    DOB: 1950/12/24, 65 y.o.   MRN: [id number]  HPI: Tabitha Larson is a 65 y.o. female  Chief Complaint  Patient presents with  . Depression  . Hyperlipidemia  . Hypertension   Hypertension Using medications without difficulty Average home BPs Not checking  No problems or lightheadedness No chest pain with exertion or shortness of breath No Edema unless standing  Depression Stable  Depression screen [facility] 2/9 10/31/2015 05/01/2015 01/24/2015  Decreased Interest 1 1 1   Down, Depressed, Hopeless 1 1 1   PHQ - 2 Score 2 2 2     Relevant past medical, surgical, family and social history reviewed and updated as indicated. Interim medical history since our last visit reviewed. Allergies and medications reviewed and updated.  Review of Systems  Per HPI unless specifically indicated above     Objective:    BP 132/83 (BP Location: Left Arm, Patient Position: Sitting, Cuff Size: Normal)   Pulse (!) 56   Temp 98.1 F (36.7 C)   Ht 5' 1.2" (1.554 m)   Wt 141 lb 3.2 oz (64 kg)   LMP  (LMP Unknown)   SpO2 96%   BMI 26.51 kg/m   Wt Readings from Last 3 Encounters:  10/31/15 141 lb 3.2 oz (64 kg)  07/28/15 140 lb 11.2 oz (63.8 kg)  07/12/15 145 lb (65.8 kg)    Physical Exam  Constitutional: She is oriented to person, place, and time. She appears well-developed and well-nourished. No distress.  HENT:  Head: Normocephalic and atraumatic.  Eyes: Conjunctivae and lids are normal. Right eye exhibits no discharge. Left eye exhibits no discharge. No scleral icterus.  Neck: Normal range of motion. Neck supple. No JVD present. Carotid bruit is not present.  Cardiovascular: Normal rate, regular rhythm and  normal heart sounds.   Pulmonary/Chest: Effort normal and breath sounds normal.  Abdominal: Normal appearance. There is no splenomegaly or hepatomegaly.  Musculoskeletal: Normal range of motion.  Neurological: She is alert and oriented to person, place, and time.  Skin: Skin is warm, dry and intact. No rash noted. No pallor.  Psychiatric: She has a normal mood and affect. Her behavior is normal. Judgment and thought content normal.       Assessment & Plan:   Problem List Items Addressed This Visit      Unprioritized   Benign hypertension    Stable, continue present medications.        Hypokalemia    Check CMP      Relevant Orders   Comprehensive metabolic panel   Major depression (HCC)    Stable, continue present medications.         Other Visit Diagnoses   None.      Follow up plan: Return in about 6 months (around 05/01/2016) for physical.

## 2015-11-01 ENCOUNTER — Encounter: Payer: Self-pay | Admitting: Family Medicine

## 2015-11-01 LAB — COMPREHENSIVE METABOLIC PANEL
ALT: 17 IU/L (ref 0–32)
AST: 14 IU/L (ref 0–40)
Albumin/Globulin Ratio: 1.7 (ref 1.2–2.2)
Albumin: 4.4 g/dL (ref 3.6–4.8)
Alkaline Phosphatase: 80 IU/L (ref 39–117)
BUN/Creatinine Ratio: 16 (ref 12–28)
BUN: 11 mg/dL (ref 8–27)
Bilirubin Total: 0.3 mg/dL (ref 0.0–1.2)
CALCIUM: 9.5 mg/dL (ref 8.7–10.3)
CO2: 26 mmol/L (ref 18–29)
CREATININE: 0.68 mg/dL (ref 0.57–1.00)
Chloride: 100 mmol/L (ref 96–106)
GFR calc Af Amer: 105 mL/min/{1.73_m2} (ref >59)
GFR, EST NON AFRICAN AMERICAN: 91 mL/min/{1.73_m2} (ref >59)
GLOBULIN, TOTAL: 2.6 g/dL (ref 1.5–4.5)
GLUCOSE: 96 mg/dL (ref 65–99)
Potassium: 3.8 mmol/L (ref 3.5–5.2)
Sodium: 141 mmol/L (ref 134–144)
Total Protein: 7 g/dL (ref 6.0–8.5)

## 2016-01-22 LAB — HM DIABETES FOOT EXAM: HM DIABETIC FOOT EXAM: NORMAL

## 2016-01-27 ENCOUNTER — Other Ambulatory Visit: Payer: Self-pay | Admitting: Unknown Physician Specialty

## 2016-03-18 ENCOUNTER — Encounter: Payer: Self-pay | Admitting: *Deleted

## 2016-03-22 NOTE — Discharge Instructions (Signed)
Cataract Surgery, Care After Refer to this sheet in the next few weeks. These instructions provide you with information about caring for yourself after your procedure. Your health care provider may also give you more specific instructions. Your treatment has been planned according to current medical practices, but problems sometimes occur. Call your health care provider if you have any problems or questions after your procedure. What can I expect after the procedure? After the procedure, it is common to have:  Itching.  Discomfort.  Fluid discharge.  Sensitivity to light and to touch.  Bruising. Follow these instructions at home: Hollis your eye every day for signs of infection. Watch for:  Redness, swelling, or pain.  Fluid, blood, or pus.  Warmth.  Bad smell. Activity  Avoid strenuous activities, such as playing contact sports, for as long as told by your health care provider.  Do not drive or operate heavy machinery until your health care provider approves.  Do not bend or lift heavy objects. Bending increases pressure in the eye. You can walk, climb stairs, and do light household chores.  Ask your health care provider when you can return to work. If you work in a dusty environment, you may be advised to wear protective eyewear for a period of time. General instructions  Take or apply over-the-counter and prescription medicines only as told by your health care provider. This includes eye drops.  Do not touch or rub your eyes.  If you were given a protective shield, wear it as told by your health care provider. If you were not given a protective shield, wear sunglasses as told by your health care provider to protect your eyes.  Keep the area around your eye clean and dry. Avoid swimming or allowing water to hit you directly in the face while showering until told by your health care provider. Keep soap and shampoo out of your eyes.  Do not put a contact lens  into the affected eye or eyes until your health care provider approves.  Keep all follow-up visits as told by your health care provider. This is important. Contact a health care provider if:   You have increased bruising around your eye.  You have pain that is not helped with medicine.  You have a fever.  You have redness, swelling, or pain in your eye.  You have fluid, blood, or pus coming from your incision.  Your vision gets worse. Get help right away if:  You have sudden vision loss. This information is not intended to replace advice given to you by your health care provider. Make sure you discuss any questions you have with your health care provider. Document Released: 09/07/2004 Document Revised: 06/29/2015 Document Reviewed: 12/29/2014 Elsevier Interactive Patient Education  2017 [employer] Anesthesia, Adult General anesthesia is the use of medicines to make a person "go to sleep" (be unconscious) for a medical procedure. General anesthesia is often recommended when a procedure:  Is long.  Requires you to be still or in an unusual position.  Is major and can cause you to lose blood.  Is impossible to do without general anesthesia. The medicines used for general anesthesia are called general anesthetics. In addition to making you sleep, the medicines:  Prevent pain.  Control your blood pressure.  Relax your muscles. Tell a health care provider about:  Any allergies you have.  All medicines you are taking, including vitamins, herbs, eye drops, creams, and over-the-counter medicines.  Any problems you or family members have had with anesthetic medicines.  Types of anesthetics you have had in the past.  Any bleeding disorders you have.  Any surgeries you have had.  Any medical conditions you have.  Any history of heart or lung conditions, such as heart failure, sleep apnea, or chronic obstructive pulmonary disease (COPD).  Whether you  are pregnant or may be pregnant.  Whether you use tobacco, alcohol, marijuana, or street drugs.  Any history of Armed forces logistics/support/administrative officer.  Any history of depression or anxiety. What are the risks? Generally, this is a safe procedure. However, problems may occur, including:  Allergic reaction to anesthetics.  Lung and heart problems.  Inhaling food or liquids from your stomach into your lungs (aspiration).  Injury to nerves.  Waking up during your procedure and being unable to move (rare).  Extreme agitation or a state of mental confusion (delirium) when you wake up from the anesthetic.  Air in the bloodstream, which can lead to stroke. These problems are more likely to develop if you are having a major surgery or if you have an advanced medical condition. You can prevent some of these complications by answering all of your health care provider's questions thoroughly and by following all pre-procedure instructions. General anesthesia can cause side effects, including:  Nausea or vomiting  A sore throat from the breathing tube.  Feeling cold or shivery.  Feeling tired, washed out, or achy.  Sleepiness or drowsiness.  Confusion or agitation. What happens before the procedure? Staying hydrated  Follow instructions from your health care provider about hydration, which may include:  Up to 2 hours before the procedure - you may continue to drink clear liquids, such as water, clear fruit juice, black coffee, and plain tea. Eating and drinking restrictions  Follow instructions from your health care provider about eating and drinking, which may include:  8 hours before the procedure - stop eating heavy meals or foods such as meat, fried foods, or fatty foods.  6 hours before the procedure - stop eating light meals or foods, such as toast or cereal.  6 hours before the procedure - stop drinking milk or drinks that contain milk.  2 hours before the procedure - stop drinking clear  liquids. Medicines  Ask your health care provider about:  Changing or stopping your regular medicines. This is especially important if you are taking diabetes medicines or blood thinners.  Taking medicines such as aspirin and ibuprofen. These medicines can thin your blood. Do not take these medicines before your procedure if your health care provider instructs you not to.  Taking new dietary supplements or medicines. Do not take these during the week before your procedure unless your health care provider approves them.  If you are told to take a medicine or to continue taking a medicine on the day of the procedure, take the medicine with sips of water. General instructions   Ask if you will be going home the same day, the following day, or after a longer hospital stay.  Plan to have someone take you home.  Plan to have someone stay with you for the first 24 hours after you leave the hospital or clinic.  For 3-6 weeks before the procedure, try not to use any tobacco products, such as cigarettes, chewing tobacco, and e-cigarettes.  You may brush your teeth on the morning of the procedure, but make sure to spit out the toothpaste. What happens during the procedure?  You will be  given anesthetics through a mask and through an IV tube in one of your veins.  You may receive medicine to help you relax (sedative).  As soon as you are asleep, a breathing tube may be used to help you breathe.  An anesthesia specialist will stay with you throughout the procedure. He or she will help keep you comfortable and safe by continuing to give you medicines and adjusting the amount of medicine that you get. He or she will also watch your blood pressure, pulse, and oxygen levels to make sure that the anesthetics do not cause any problems.  If a breathing tube was used to help you breathe, it will be removed before you wake up. The procedure may vary among health care providers and hospitals. What  happens after the procedure?  You will wake up, often slowly, after the procedure is complete, usually in a recovery area.  Your blood pressure, heart rate, breathing rate, and blood oxygen level will be monitored until the medicines you were given have worn off.  You may be given medicine to help you calm down if you feel anxious or agitated.  If you will be going home the same day, your health care provider may check to make sure you can stand, drink, and urinate.  Your health care providers will treat your pain and side effects before you go home.  Do not drive for 24 hours if you received a sedative.  You may:  Feel nauseous and vomit.  Have a sore throat.  Have mental slowness.  Feel cold or shivery.  Feel sleepy.  Feel tired.  Feel sore or achy, even in parts of your body where you did not have surgery. This information is not intended to replace advice given to you by your health care provider. Make sure you discuss any questions you have with your health care provider. Document Released: 05/28/2007 Document Revised: 08/01/2015 Document Reviewed: 02/02/2015 Elsevier Interactive Patient Education  2017 [employer].

## 2016-03-26 ENCOUNTER — Ambulatory Visit: Payer: Medicare Other | Admitting: Anesthesiology

## 2016-03-26 ENCOUNTER — Encounter
Admission: RE | Disposition: A | Discharge status: Home / Self Care | Payer: Self-pay | Source: Ambulatory Visit | Attending: Ophthalmology

## 2016-03-26 ENCOUNTER — Ambulatory Visit
Admission: RE | Admit: 2016-03-26 | Discharge: 2016-03-26 | Disposition: A | Discharge status: Home / Self Care | Payer: Medicare Other | Source: Ambulatory Visit | Attending: Ophthalmology | Admitting: Ophthalmology

## 2016-03-26 DIAGNOSIS — I10 Essential (primary) hypertension: Secondary | ICD-10-CM | POA: Diagnosis not present

## 2016-03-26 DIAGNOSIS — K219 Gastro-esophageal reflux disease without esophagitis: Secondary | ICD-10-CM | POA: Insufficient documentation

## 2016-03-26 DIAGNOSIS — H2512 Age-related nuclear cataract, left eye: Secondary | ICD-10-CM | POA: Diagnosis not present

## 2016-03-26 DIAGNOSIS — Z87891 Personal history of nicotine dependence: Secondary | ICD-10-CM | POA: Diagnosis not present

## 2016-03-26 HISTORY — PX: CATARACT EXTRACTION W/PHACO: SHX586

## 2016-03-26 SURGERY — PHACOEMULSIFICATION, CATARACT, WITH IOL INSERTION
Anesthesia: Monitor Anesthesia Care | Laterality: Left | Wound class: Clean

## 2016-03-26 MED ORDER — FENTANYL CITRATE (PF) 100 MCG/2ML IJ SOLN
INTRAMUSCULAR | Status: DC | PRN
  Administered 2016-03-26: 50 ug via INTRAVENOUS

## 2016-03-26 MED ORDER — MIDAZOLAM HCL 2 MG/2ML IJ SOLN
INTRAMUSCULAR | Status: DC | PRN
  Administered 2016-03-26: 2 mg via INTRAVENOUS

## 2016-03-26 MED ORDER — MOXIFLOXACIN HCL 0.5 % OP SOLN
OPHTHALMIC | Status: DC | PRN
  Administered 2016-03-26: 1 [drp] via OPHTHALMIC

## 2016-03-26 MED ORDER — LIDOCAINE HCL (PF) 4 % IJ SOLN
INTRAMUSCULAR | Status: DC | PRN
  Administered 2016-03-26: .5 mL via OPHTHALMIC

## 2016-03-26 MED ORDER — LACTATED RINGERS IV SOLN: INTRAVENOUS | Status: DC

## 2016-03-26 MED ORDER — ARMC OPHTHALMIC DILATING DROPS
1.0000 "application " | OPHTHALMIC | Status: DC | PRN
  Administered 2016-03-26 (×3): 1 via OPHTHALMIC

## 2016-03-26 MED ORDER — SODIUM HYALURONATE 10 MG/ML IO SOLN
INTRAOCULAR | Status: DC | PRN
  Administered 2016-03-26: .55 mL via INTRAOCULAR

## 2016-03-26 MED ORDER — EPINEPHRINE PF 1 MG/ML IJ SOLN
INTRAMUSCULAR | Status: DC | PRN
  Administered 2016-03-26: 103 mL via OPHTHALMIC

## 2016-03-26 MED ORDER — SODIUM HYALURONATE 23 MG/ML IO SOLN
INTRAOCULAR | Status: DC | PRN
  Administered 2016-03-26: 0.6 mL via INTRAOCULAR

## 2016-03-26 SURGICAL SUPPLY — 20 items
CANNULA ANT/CHMB 27GA (MISCELLANEOUS) ×3 IMPLANT
CUP MEDICINE 2OZ PLAST GRAD ST (MISCELLANEOUS) ×3 IMPLANT
DISSECTOR HYDRO NUCLEUS 50X22 (MISCELLANEOUS) ×3 IMPLANT
GLOVE BIO SURGEON STRL SZ8 (GLOVE) ×3 IMPLANT
GLOVE SURG LX 7.5 STRW (GLOVE) ×2
GLOVE SURG LX STRL 7.5 STRW (GLOVE) ×1 IMPLANT
GOWN STRL REUS W/ TWL LRG LVL3 (GOWN DISPOSABLE) ×2 IMPLANT
GOWN STRL REUS W/TWL LRG LVL3 (GOWN DISPOSABLE) ×4
LENS IOL ACRSF IQ ULTRA 18.5 (Intraocular Lens) ×1 IMPLANT
LENS IOL ACRYSOF IQ 18.5 (Intraocular Lens) ×3 IMPLANT
MARKER SKIN DUAL TIP RULER LAB (MISCELLANEOUS) ×3 IMPLANT
PACK CATARACT (MISCELLANEOUS) ×3 IMPLANT
PACK CATARACT BRASINGTON (MISCELLANEOUS) ×3 IMPLANT
PACK EYE AFTER SURG (MISCELLANEOUS) ×3 IMPLANT
SOL PREP PVP 2OZ (MISCELLANEOUS) ×3
SOLUTION PREP PVP 2OZ (MISCELLANEOUS) ×1 IMPLANT
SYR 3ML LL SCALE MARK (SYRINGE) ×3 IMPLANT
SYR TB 1ML LUER SLIP (SYRINGE) ×3 IMPLANT
WATER STERILE IRR 250ML POUR (IV SOLUTION) ×3 IMPLANT
WIPE NON LINTING 3.25X3.25 (MISCELLANEOUS) ×3 IMPLANT

## 2016-03-26 NOTE — Anesthesia Procedure Notes (Signed)
Procedure Name: MAC Performed by: Mayme Genta Pre-anesthesia Checklist: Patient identified, Emergency Drugs available, Suction available, Timeout performed and Patient being monitored Patient Re-evaluated:Patient Re-evaluated prior to inductionOxygen Delivery Method: Nasal cannula Placement Confirmation: positive ETCO2

## 2016-03-26 NOTE — Transfer of Care (Signed)
Immediate Anesthesia Transfer of Care Note  Patient: Tabitha Larson  Procedure(s) Performed: Procedure(s): CATARACT EXTRACTION PHACO AND INTRAOCULAR LENS PLACEMENT (IOC)  left (Left)  Patient Location: PACU  Anesthesia Type: MAC  Level of Consciousness: awake, alert  and patient cooperative  Airway and Oxygen Therapy: Patient Spontanous Breathing and Patient connected to supplemental oxygen  Post-op Assessment: Post-op Vital signs reviewed, Patient's Cardiovascular Status Stable, Respiratory Function Stable, Patent Airway and No signs of Nausea or vomiting  Post-op Vital Signs: Reviewed and stable  Complications: No apparent anesthesia complications

## 2016-03-26 NOTE — H&P (Signed)
The History and Physical notes are on paper, have been signed, and are to be scanned. The patient remains stable and unchanged from the H&P.   Previous H&P reviewed, patient examined, and there are no changes.  Tabitha Larson 03/26/2016 7:21 AM

## 2016-03-26 NOTE — Op Note (Signed)
OPERATIVE NOTE  Tabitha Larson [id number] 03/26/2016   PREOPERATIVE DIAGNOSIS:  Nuclear sclerotic cataract left eye.  H25.12   POSTOPERATIVE DIAGNOSIS:    Nuclear sclerotic cataract left eye.     PROCEDURE:  Phacoemusification with posterior chamber intraocular lens placement of the left eye   LENS:   Implant Name Type Inv. Item Serial No. Manufacturer Lot No. LRB No. Used  LENS IOL ACRYSOF IQ 18.5 - [id number] Intraocular Lens LENS IOL ACRYSOF IQ 18.5 [id number] ALCON   Left 1       AU00T0 18.5 D   ULTRASOUND TIME: 1 minutes 05 seconds.  CDE 11.13   SURGEON:  Benay Pillow, MD, MPH   ANESTHESIA:  Topical with tetracaine drops augmented with 1% preservative-free intracameral lidocaine.  ESTIMATED BLOOD LOSS: <1 mL   COMPLICATIONS:  None.   DESCRIPTION OF PROCEDURE:  The patient was identified in the holding room and transported to the operating room and placed in the supine position under the operating microscope.  The left eye was identified as the operative eye and it was prepped and draped in the usual sterile ophthalmic fashion.   A 1.0 millimeter clear-corneal paracentesis was made at the 5:00 position. 0.5 ml of preservative-free 1% lidocaine with epinephrine was injected into the anterior chamber.  The anterior chamber was filled with Healon 5 viscoelastic.  A 2.4 millimeter keratome was used to make a near-clear corneal incision at the 2:00 position.  A curvilinear capsulorrhexis was made with a cystotome and capsulorrhexis forceps.  Balanced salt solution was used to hydrodissect and hydrodelineate the nucleus.   Phacoemulsification was then used in stop and chop fashion to remove the lens nucleus and epinucleus.  The remaining cortex was then removed using the irrigation and aspiration handpiece. Healon was then placed into the capsular bag to distend it for lens placement.  A lens was then injected into the capsular bag.  The remaining viscoelastic was aspirated.    Wounds were hydrated with balanced salt solution.  The anterior chamber was inflated to a physiologic pressure with balanced salt solution.   Intracameral vigamox 0.1 mL undiltued was injected into the eye and a drop placed onto the ocular surface.  No wound leaks were noted.  The patient was taken to the recovery room in stable condition without complications of anesthesia or surgery  Benay Pillow 03/26/2016, 8:03 AM

## 2016-03-26 NOTE — Anesthesia Postprocedure Evaluation (Signed)
Anesthesia Post Note  Patient: Tabitha Larson  Procedure(s) Performed: Procedure(s) (LRB): CATARACT EXTRACTION PHACO AND INTRAOCULAR LENS PLACEMENT ([facility])  left (Left)  Patient location during evaluation: PACU Anesthesia Type: MAC Level of consciousness: awake and alert Pain management: pain level controlled Vital Signs Assessment: post-procedure vital signs reviewed and stable Respiratory status: spontaneous breathing, nonlabored ventilation, respiratory function stable and patient connected to nasal cannula oxygen Cardiovascular status: stable and blood pressure returned to baseline Anesthetic complications: no    Trecia Rogers

## 2016-03-26 NOTE — Anesthesia Preprocedure Evaluation (Addendum)
Anesthesia Evaluation  Patient identified by MRN, date of birth, ID band Patient awake    Reviewed: Allergy & Precautions, H&P , NPO status , Patient's Chart, lab work & pertinent test results, reviewed documented beta blocker date and time   History of Anesthesia Complications (+) PONV and history of anesthetic complications  Airway Mallampati: II  TM Distance: >3 FB Neck ROM: full    Dental  (+) Upper Dentures   Pulmonary former smoker,    Pulmonary exam normal breath sounds clear to auscultation       Cardiovascular Exercise Tolerance: Good hypertension, Normal cardiovascular exam Rhythm:regular Rate:Normal     Neuro/Psych negative neurological ROS  negative psych ROS   GI/Hepatic Neg liver ROS, GERD  Controlled,  Endo/Other  negative endocrine ROS  Renal/GU negative Renal ROS  negative genitourinary   Musculoskeletal   Abdominal   Peds  Hematology negative hematology ROS (+)   Anesthesia Other Findings   Reproductive/Obstetrics negative OB ROS                            Anesthesia Physical Anesthesia Plan  ASA: II  Anesthesia Plan: MAC   Post-op Pain Management:    Induction:   Airway Management Planned: Nasal ETT  Additional Equipment:   Intra-op Plan:   Post-operative Plan:   Informed Consent: I have reviewed the patients History and Physical, chart, labs and discussed the procedure including the risks, benefits and alternatives for the proposed anesthesia with the patient or authorized representative who has indicated his/her understanding and acceptance.   Dental Advisory Given  Plan Discussed with: CRNA  Anesthesia Plan Comments:        Anesthesia Quick Evaluation

## 2016-03-27 ENCOUNTER — Encounter: Payer: Self-pay | Admitting: Ophthalmology

## 2016-05-03 ENCOUNTER — Encounter: Payer: Medicare Other | Admitting: Unknown Physician Specialty

## 2016-05-06 ENCOUNTER — Ambulatory Visit (INDEPENDENT_AMBULATORY_CARE_PROVIDER_SITE_OTHER): Payer: Medicare Other | Admitting: Unknown Physician Specialty

## 2016-05-06 ENCOUNTER — Encounter: Payer: Self-pay | Admitting: Unknown Physician Specialty

## 2016-05-06 VITALS — BP 126/80 | HR 70 | Temp 98.3°F | Ht 60.5 in | Wt 145.4 lb

## 2016-05-06 DIAGNOSIS — M810 Age-related osteoporosis without current pathological fracture: Secondary | ICD-10-CM | POA: Diagnosis not present

## 2016-05-06 DIAGNOSIS — E7800 Pure hypercholesterolemia, unspecified: Secondary | ICD-10-CM

## 2016-05-06 DIAGNOSIS — F332 Major depressive disorder, recurrent severe without psychotic features: Secondary | ICD-10-CM | POA: Diagnosis not present

## 2016-05-06 DIAGNOSIS — I10 Essential (primary) hypertension: Secondary | ICD-10-CM | POA: Diagnosis not present

## 2016-05-06 DIAGNOSIS — Z0000 Encounter for general adult medical examination without abnormal findings: Secondary | ICD-10-CM

## 2016-05-06 DIAGNOSIS — Z7189 Other specified counseling: Secondary | ICD-10-CM | POA: Insufficient documentation

## 2016-05-06 NOTE — Assessment & Plan Note (Signed)
A voluntary discussion about advance care planning including the explanation and discussion of advance directives was extensively discussed  with the patient.  Explanation about the health care proxy and Living will was reviewed and packet with forms with explanation of how to fill them out was given.  During this discussion, the patient was able to identify a health care proxy as her sister and plans  to fill out the paperwork required.  Patient was offered a separate [place] visit for further assistance with forms.  Pt did elect to be a DNR and that form was filled out

## 2016-05-06 NOTE — Assessment & Plan Note (Signed)
Stable, continue present medications.   

## 2016-05-06 NOTE — Assessment & Plan Note (Signed)
Bone density

## 2016-05-06 NOTE — Assessment & Plan Note (Signed)
Check lipid panel  

## 2016-05-06 NOTE — Patient Instructions (Signed)
Please do call to schedule your mammogram; the number to schedule one at either [facility] or [facility] is [phone]   

## 2016-05-06 NOTE — Assessment & Plan Note (Signed)
States this is better.  Continue present meds

## 2016-05-06 NOTE — Progress Notes (Signed)
BP 126/80 (BP Location: Left Arm, Patient Position: Sitting, Cuff Size: Normal)   Pulse 70   Temp 98.3 F (36.8 C)   Ht 5' 0.5" (1.537 m)   Wt 145 lb 6.4 oz (66 kg)   LMP  (LMP Unknown)   SpO2 97%   BMI 27.93 kg/m    Subjective:    Patient ID: Tabitha Larson, female    DOB: [birth year], 66 y.o.   MRN: [id number]  HPI: Tabitha Larson is a 65 y.o. female  Chief Complaint  Patient presents with  . Medicare Wellness   Functional Status Survey: Is the patient deaf or have difficulty hearing?: No Does the patient have difficulty seeing, even when wearing glasses/contacts?: No Does the patient have difficulty concentrating, remembering, or making decisions?: No Does the patient have difficulty walking or climbing stairs?: No Does the patient have difficulty dressing or bathing?: No Does the patient have difficulty doing errands alone such as visiting a doctor's office or shopping?: No  Depression screen [facility] 2/9 05/06/2016 10/31/2015 05/01/2015 01/24/2015  Decreased Interest 1 1 1 1   Down, Depressed, Hopeless 1 1 1 1   PHQ - 2 Score 2 2 2 2   Altered sleeping 1 - - -  Tired, decreased energy 1 - - -  Change in appetite 3 - - -  Feeling bad or failure about yourself  2 - - -  Trouble concentrating 2 - - -  Moving slowly or fidgety/restless 1 - - -  Suicidal thoughts 0 - - -  PHQ-9 Score 12 - - -   Fall Risk  05/06/2016 05/01/2015 01/24/2015  Falls in the past year? Yes No No  Number falls in past yr: 2 or more - -  Injury with Fall? No - -   Social History   Social History  . Marital status: Single    Spouse name: N/A  . Number of children: N/A  . Years of education: N/A   Occupational History  . Not on file.   Social History Main Topics  . Smoking status: Former Smoker    Types: Cigarettes    Quit date: 01/05/1984  . Smokeless tobacco: Never Used  . Alcohol use No  . Drug use: No  . Sexual activity: Not Currently    Birth control/ protection: Post-menopausal    Other Topics Concern  . Not on file   Social History Narrative  . No narrative on file   Family History  Problem Relation Age of Onset  . Stroke Mother   . Cancer Father     Prostate  . Cancer Sister     colon  . Hypertension Sister   . Hyperlipidemia Sister    Past Medical History:  Diagnosis Date  . Ankle fracture   . Anxiety   . Depression   . GERD (gastroesophageal reflux disease)   . Hyperlipidemia   . Hypertension   . Migraine   . Osteoporosis   . PONV (postoperative nausea and vomiting)    after ankle repair   . Wears dentures    full upper   Past Surgical History:  Procedure Laterality Date  . ANKLE FRACTURE SURGERY    . CATARACT EXTRACTION Right 01/2014  . CATARACT EXTRACTION W/PHACO Left 03/26/2016   Procedure: CATARACT EXTRACTION PHACO AND INTRAOCULAR LENS PLACEMENT ([place])  left;  Surgeon: Eulogio Bear, MD;  Location: [place];  Service: Ophthalmology;  Laterality: Left;  . COLONOSCOPY  02/04/2014  . COLONOSCOPY WITH PROPOFOL N/A 07/28/2015  Procedure: COLONOSCOPY WITH PROPOFOL;  Surgeon: Lucilla Lame, MD;  Location: [place];  Service: Endoscopy;  Laterality: N/A;  No anesthesia  . MANDIBLE SURGERY    . POLYPECTOMY  07/28/2015   Procedure: POLYPECTOMY;  Surgeon: Lucilla Lame, MD;  Location: [place];  Service: Endoscopy;;     Relevant past medical, surgical, family and social history reviewed and updated as indicated. Interim medical history since our last visit reviewed. Allergies and medications reviewed and updated.  Review of Systems  Constitutional: Negative.   HENT: Negative.   Eyes: Negative.   Respiratory: Negative.   Cardiovascular: Negative.   Gastrointestinal: Negative.   Endocrine: Negative.   Genitourinary: Negative.   Musculoskeletal: Negative.   Skin:       Skin tags on neck bothering her  Allergic/Immunologic: Negative.   Neurological: Negative.   Hematological: Negative.    Psychiatric/Behavioral: Negative.     Per HPI unless specifically indicated above     Objective:    BP 126/80 (BP Location: Left Arm, Patient Position: Sitting, Cuff Size: Normal)   Pulse 70   Temp 98.3 F (36.8 C)   Ht 5' 0.5" (1.537 m)   Wt 145 lb 6.4 oz (66 kg)   LMP  (LMP Unknown)   SpO2 97%   BMI 27.93 kg/m   Wt Readings from Last 3 Encounters:  05/06/16 145 lb 6.4 oz (66 kg)  03/26/16 143 lb (64.9 kg)  10/31/15 141 lb 3.2 oz (64 kg)    Physical Exam  Constitutional: She is oriented to person, place, and time. She appears well-developed and well-nourished.  HENT:  Head: Normocephalic and atraumatic.  Eyes: Pupils are equal, round, and reactive to light. Right eye exhibits no discharge. Left eye exhibits no discharge. No scleral icterus.  Neck: Normal range of motion. Neck supple. Carotid bruit is not present. No thyromegaly present.  Cardiovascular: Normal rate, regular rhythm and normal heart sounds.  Exam reveals no gallop and no friction rub.   No murmur heard. Pulmonary/Chest: Effort normal and breath sounds normal. No respiratory distress. She has no wheezes. She has no rales.  Abdominal: Soft. Bowel sounds are normal. There is no tenderness. There is no rebound.  Genitourinary: No breast swelling, tenderness or discharge.  Musculoskeletal: Normal range of motion.  Lymphadenopathy:    She has no cervical adenopathy.  Neurological: She is alert and oriented to person, place, and time.  Skin: Skin is warm, dry and intact. No rash noted.  Psychiatric: She has a normal mood and affect. Her speech is normal and behavior is normal. Judgment and thought content normal. Cognition and memory are normal.    Results for orders placed or performed in visit on 04/30/16  HM DIABETES FOOT EXAM  Result Value Ref Range   HM Diabetic Foot Exam Normal- see report       Assessment & Plan:   Problem List Items Addressed This Visit      Unprioritized   Advanced care  planning/counseling discussion    A voluntary discussion about advance care planning including the explanation and discussion of advance directives was extensively discussed  with the patient.  Explanation about the health care proxy and Living will was reviewed and packet with forms with explanation of how to fill them out was given.  During this discussion, the patient was able to identify a health care proxy as her sister and plans  to fill out the paperwork required.  Patient was offered a separate Advance Care Planning visit  for further assistance with forms.  Pt did elect to be a DNR and that form was filled out       Benign hypertension    Stable, continue present medications.        Relevant Orders   Comprehensive metabolic panel   Hypercholesterolemia    Check lipid panel      Relevant Orders   Lipid Panel w/o Chol/HDL Ratio   Major depression    States this is better.  Continue present meds      Osteoporosis    Bone density      Relevant Orders   DG Bone Density    Other Visit Diagnoses    Annual physical exam    -  Primary   Relevant Orders   MM DIGITAL SCREENING BILATERAL       Follow up plan: Return for appt in 6 months and for skin tags.

## 2016-05-07 ENCOUNTER — Encounter: Payer: Self-pay | Admitting: Unknown Physician Specialty

## 2016-05-07 LAB — COMPREHENSIVE METABOLIC PANEL
A/G RATIO: 1.7 (ref 1.2–2.2)
ALBUMIN: 4.5 g/dL (ref 3.6–4.8)
ALT: 21 IU/L (ref 0–32)
AST: 15 IU/L (ref 0–40)
Alkaline Phosphatase: 83 IU/L (ref 39–117)
BUN / CREAT RATIO: 23 (ref 12–28)
BUN: 15 mg/dL (ref 8–27)
Bilirubin Total: 0.3 mg/dL (ref 0.0–1.2)
CALCIUM: 9.7 mg/dL (ref 8.7–10.3)
CO2: 23 mmol/L (ref 18–29)
Chloride: 99 mmol/L (ref 96–106)
Creatinine, Ser: 0.66 mg/dL (ref 0.57–1.00)
GFR calc Af Amer: 106 mL/min/{1.73_m2} (ref >59)
GFR, EST NON AFRICAN AMERICAN: 92 mL/min/{1.73_m2} (ref >59)
GLOBULIN, TOTAL: 2.7 g/dL (ref 1.5–4.5)
Glucose: 93 mg/dL (ref 65–99)
POTASSIUM: 4.1 mmol/L (ref 3.5–5.2)
SODIUM: 140 mmol/L (ref 134–144)
Total Protein: 7.2 g/dL (ref 6.0–8.5)

## 2016-05-07 LAB — LIPID PANEL W/O CHOL/HDL RATIO
Cholesterol, Total: 185 mg/dL (ref 100–199)
HDL: 43 mg/dL (ref >39)
LDL CALC: 123 mg/dL — AB (ref 0–99)
TRIGLYCERIDES: 94 mg/dL (ref 0–149)
VLDL Cholesterol Cal: 19 mg/dL (ref 5–40)

## 2016-05-18 ENCOUNTER — Other Ambulatory Visit: Payer: Self-pay | Admitting: Unknown Physician Specialty

## 2016-06-17 ENCOUNTER — Encounter: Payer: Self-pay | Admitting: Unknown Physician Specialty

## 2016-06-17 ENCOUNTER — Ambulatory Visit (INDEPENDENT_AMBULATORY_CARE_PROVIDER_SITE_OTHER): Payer: Medicare Other | Admitting: Unknown Physician Specialty

## 2016-06-17 DIAGNOSIS — L918 Other hypertrophic disorders of the skin: Secondary | ICD-10-CM | POA: Insufficient documentation

## 2016-06-17 NOTE — Progress Notes (Signed)
BP 138/81 (BP Location: Left Arm, Patient Position: Sitting, Cuff Size: Small)   Pulse (!) 56   Temp 97.7 F (36.5 C)   Wt 145 lb 3.2 oz (65.9 kg)   LMP  (LMP Unknown)   SpO2 98%   BMI 27.89 kg/m    Subjective:    Patient ID: Tabitha Larson, female    DOB: 1950-09-27, 66 y.o.   MRN: [id number]  HPI: Tabitha Larson is a 66 y.o. female  Chief Complaint  Patient presents with  . Skin Tag    pt has skin tags on her left collar line that she would like removed   Skin tags Pt with multiple sin tags left side of neck that get irritated by rubbing against her collar.  Sometimes they bleed.    Relevant past medical, surgical, family and social history reviewed and updated as indicated. Interim medical history since our last visit reviewed. Allergies and medications reviewed and updated.  Review of Systems  Per HPI unless specifically indicated above     Objective:    BP 138/81 (BP Location: Left Arm, Patient Position: Sitting, Cuff Size: Small)   Pulse (!) 56   Temp 97.7 F (36.5 C)   Wt 145 lb 3.2 oz (65.9 kg)   LMP  (LMP Unknown)   SpO2 98%   BMI 27.89 kg/m   Wt Readings from Last 3 Encounters:  06/17/16 145 lb 3.2 oz (65.9 kg)  05/06/16 145 lb 6.4 oz (66 kg)  03/26/16 143 lb (64.9 kg)    Physical Exam  Constitutional: She is oriented to person, place, and time. She appears well-developed and well-nourished. No distress.  HENT:  Head: Normocephalic and atraumatic.  Eyes: Conjunctivae and lids are normal. Right eye exhibits no discharge. Left eye exhibits no discharge. No scleral icterus.  Cardiovascular: Normal rate.   Pulmonary/Chest: Effort normal.  Abdominal: Normal appearance. There is no splenomegaly or hepatomegaly.  Musculoskeletal: Normal range of motion.  Neurological: She is alert and oriented to person, place, and time.  Skin: Skin is intact. No rash noted. No pallor.  Multiple skin tags left side of neck   Psychiatric: She has a normal mood and  affect. Her behavior is normal. Judgment and thought content normal.   8 skin tags cryoed using cryo therapy tank.   Results for orders placed or performed in visit on 05/06/16  Comprehensive metabolic panel  Result Value Ref Range   Glucose 93 65 - 99 mg/dL   BUN 15 8 - 27 mg/dL   Creatinine, Ser 0.66 0.57 - 1.00 mg/dL   GFR calc non Af Amer 92 >59 mL/min/1.73   GFR calc Af Amer 106 >59 mL/min/1.73   BUN/Creatinine Ratio 23 12 - 28   Sodium 140 134 - 144 mmol/L   Potassium 4.1 3.5 - 5.2 mmol/L   Chloride 99 96 - 106 mmol/L   CO2 23 18 - 29 mmol/L   Calcium 9.7 8.7 - 10.3 mg/dL   Total Protein 7.2 6.0 - 8.5 g/dL   Albumin 4.5 3.6 - 4.8 g/dL   Globulin, Total 2.7 1.5 - 4.5 g/dL   Albumin/Globulin Ratio 1.7 1.2 - 2.2   Bilirubin Total 0.3 0.0 - 1.2 mg/dL   Alkaline Phosphatase 83 39 - 117 IU/L   AST 15 0 - 40 IU/L   ALT 21 0 - 32 IU/L  Lipid Panel w/o Chol/HDL Ratio  Result Value Ref Range   Cholesterol, Total 185 100 - 199 mg/dL  Triglycerides 94 0 - 149 mg/dL   HDL 43 >39 mg/dL   VLDL Cholesterol Cal 19 5 - 40 mg/dL   LDL Calculated 123 (H) 0 - 99 mg/dL      Assessment & Plan:   Problem List Items Addressed This Visit      Unprioritized   Skin tags, multiple acquired       Follow up plan: Return if symptoms worsen or fail to improve.

## 2016-09-12 ENCOUNTER — Other Ambulatory Visit: Payer: Self-pay | Admitting: Unknown Physician Specialty

## 2016-11-11 ENCOUNTER — Ambulatory Visit (INDEPENDENT_AMBULATORY_CARE_PROVIDER_SITE_OTHER): Payer: Medicare Other | Admitting: Unknown Physician Specialty

## 2016-11-11 ENCOUNTER — Encounter: Payer: Self-pay | Admitting: Unknown Physician Specialty

## 2016-11-11 VITALS — BP 135/82 | HR 56 | Temp 98.0°F | Wt 139.8 lb

## 2016-11-11 DIAGNOSIS — E7800 Pure hypercholesterolemia, unspecified: Secondary | ICD-10-CM

## 2016-11-11 DIAGNOSIS — F332 Major depressive disorder, recurrent severe without psychotic features: Secondary | ICD-10-CM | POA: Diagnosis not present

## 2016-11-11 DIAGNOSIS — Z23 Encounter for immunization: Secondary | ICD-10-CM

## 2016-11-11 DIAGNOSIS — I10 Essential (primary) hypertension: Secondary | ICD-10-CM

## 2016-11-11 MED ORDER — DULOXETINE HCL 60 MG PO CPEP: 60.0000 mg | ORAL_CAPSULE | Freq: Every day | ORAL | 1 refills | Status: DC

## 2016-11-11 NOTE — Patient Instructions (Addendum)

## 2016-11-11 NOTE — Progress Notes (Signed)
BP 135/82   Pulse (!) 56   Temp 98 F (36.7 C)   Wt 139 lb 12.8 oz (63.4 kg)   LMP  (LMP Unknown)   SpO2 98%   BMI 26.85 kg/m    Subjective:    Patient ID: Tabitha Larson, female    DOB: Dec 15, 1950, 66 y.o.   MRN: [id number]  HPI: Tabitha Larson is a 66 y.o. female  Chief Complaint  Patient presents with  . Depression  . Hyperlipidemia  . Hypertension   Hypertension Using medications without difficulty Average home BPs Not checking  No problems or lightheadedness No chest pain with exertion or shortness of breath No Edema  Hyperlipidemia Using medications without problems No Muscle aches  Diet compliance:Exercise:Busy with doing things for her cousins  Depression States she is "steady"  On Cymbalta 30 mg.   Depression screen [facility] 2/9 11/11/2016 05/06/2016 10/31/2015 05/01/2015 01/24/2015  Decreased Interest 1 1 1 1 1   Down, Depressed, Hopeless 1 1 1 1 1   PHQ - 2 Score 2 2 2 2 2   Altered sleeping 1 1 - - -  Tired, decreased energy 2 1 - - -  Change in appetite 3 3 - - -  Feeling bad or failure about yourself  1 2 - - -  Trouble concentrating 1 2 - - -  Moving slowly or fidgety/restless 1 1 - - -  Suicidal thoughts 0 0 - - -  PHQ-9 Score 11 12 - - -     Relevant past medical, surgical, family and social history reviewed and updated as indicated. Interim medical history since our last visit reviewed. Allergies and medications reviewed and updated.  Review of Systems  Per HPI unless specifically indicated above     Objective:    BP 135/82   Pulse (!) 56   Temp 98 F (36.7 C)   Wt 139 lb 12.8 oz (63.4 kg)   LMP  (LMP Unknown)   SpO2 98%   BMI 26.85 kg/m   Wt Readings from Last 3 Encounters:  11/11/16 139 lb 12.8 oz (63.4 kg)  06/17/16 145 lb 3.2 oz (65.9 kg)  05/06/16 145 lb 6.4 oz (66 kg)    Physical Exam  Constitutional: She is oriented to person, place, and time. She appears well-developed and well-nourished. No distress.  HENT:  Head:  Normocephalic and atraumatic.  Eyes: Conjunctivae and lids are normal. Right eye exhibits no discharge. Left eye exhibits no discharge. No scleral icterus.  Neck: Normal range of motion. Neck supple. No JVD present. Carotid bruit is not present.  Cardiovascular: Normal rate, regular rhythm and normal heart sounds.   Pulmonary/Chest: Effort normal and breath sounds normal.  Abdominal: Normal appearance. There is no splenomegaly or hepatomegaly.  Musculoskeletal: Normal range of motion.  Neurological: She is alert and oriented to person, place, and time.  Skin: Skin is warm, dry and intact. No rash noted. No pallor.  Psychiatric: She has a normal mood and affect. Her behavior is normal. Judgment and thought content normal.    Results for orders placed or performed in visit on 05/06/16  Comprehensive metabolic panel  Result Value Ref Range   Glucose 93 65 - 99 mg/dL   BUN 15 8 - 27 mg/dL   Creatinine, Ser 0.66 0.57 - 1.00 mg/dL   GFR calc non Af Amer 92 >59 mL/min/1.73   GFR calc Af Amer 106 >59 mL/min/1.73   BUN/Creatinine Ratio 23 12 - 28   Sodium 140 134 -  144 mmol/L   Potassium 4.1 3.5 - 5.2 mmol/L   Chloride 99 96 - 106 mmol/L   CO2 23 18 - 29 mmol/L   Calcium 9.7 8.7 - 10.3 mg/dL   Total Protein 7.2 6.0 - 8.5 g/dL   Albumin 4.5 3.6 - 4.8 g/dL   Globulin, Total 2.7 1.5 - 4.5 g/dL   Albumin/Globulin Ratio 1.7 1.2 - 2.2   Bilirubin Total 0.3 0.0 - 1.2 mg/dL   Alkaline Phosphatase 83 39 - 117 IU/L   AST 15 0 - 40 IU/L   ALT 21 0 - 32 IU/L  Lipid Panel w/o Chol/HDL Ratio  Result Value Ref Range   Cholesterol, Total 185 100 - 199 mg/dL   Triglycerides 94 0 - 149 mg/dL   HDL 43 >39 mg/dL   VLDL Cholesterol Cal 19 5 - 40 mg/dL   LDL Calculated 123 (H) 0 - 99 mg/dL      Assessment & Plan:   Problem List Items Addressed This Visit      Unprioritized   Benign hypertension    Stable, continue present medications.        Hypercholesterolemia    Stable, continue present  medications.        Major depression    Not to goal.  Increase Cymbalta to 60 mg      Relevant Medications   DULoxetine (CYMBALTA) 60 MG capsule    Other Visit Diagnoses    Need for influenza vaccination    -  Primary   Relevant Orders   Flu vaccine HIGH DOSE PF (Completed)       Follow up plan: Return in about 4 weeks (around 12/09/2016).

## 2016-11-11 NOTE — Assessment & Plan Note (Signed)
Not to goal.  Increase Cymbalta to 60 mg

## 2016-11-11 NOTE — Assessment & Plan Note (Signed)
Stable, continue present medications.   

## 2016-12-09 ENCOUNTER — Ambulatory Visit: Payer: Medicare Other | Admitting: Unknown Physician Specialty

## 2016-12-31 ENCOUNTER — Encounter: Payer: Self-pay | Admitting: Unknown Physician Specialty

## 2016-12-31 ENCOUNTER — Ambulatory Visit
Admission: RE | Admit: 2016-12-31 | Discharge: 2016-12-31 | Disposition: A | Discharge status: Home / Self Care | Payer: Medicare Other | Source: Ambulatory Visit | Attending: Unknown Physician Specialty | Admitting: Unknown Physician Specialty

## 2016-12-31 ENCOUNTER — Ambulatory Visit (INDEPENDENT_AMBULATORY_CARE_PROVIDER_SITE_OTHER): Payer: Medicare Other | Admitting: Unknown Physician Specialty

## 2016-12-31 DIAGNOSIS — G8929 Other chronic pain: Secondary | ICD-10-CM

## 2016-12-31 DIAGNOSIS — M25562 Pain in left knee: Secondary | ICD-10-CM

## 2016-12-31 DIAGNOSIS — R9389 Abnormal findings on diagnostic imaging of other specified body structures: Secondary | ICD-10-CM | POA: Insufficient documentation

## 2016-12-31 DIAGNOSIS — F332 Major depressive disorder, recurrent severe without psychotic features: Secondary | ICD-10-CM | POA: Diagnosis not present

## 2016-12-31 NOTE — Assessment & Plan Note (Signed)
No real help with medications.  [facility]  Number given

## 2016-12-31 NOTE — Patient Instructions (Addendum)
[place]: [phone]   Arthritis Arthritis is a term that is commonly used to refer to joint pain or joint disease. There are more than 100 types of arthritis. What are the causes? The most common cause of this condition is wear and tear of a joint. Other causes include:  Gout.  Inflammation of a joint.  An infection of a joint.  Sprains and other injuries near the joint.  A drug reaction or allergic reaction.  In some cases, the cause may not be known. What are the signs or symptoms? The main symptom of this condition is pain in the joint with movement. Other symptoms include:  Redness, swelling, or stiffness at a joint.  Warmth coming from the joint.  Fever.  Overall feeling of illness.  How is this diagnosed? This condition may be diagnosed with a physical exam and tests, including:  Blood tests.  Urine tests.  Imaging tests, such as MRI, X-rays, or a CT scan.  Sometimes, fluid is removed from a joint for testing. How is this treated? Treatment for this condition may involve:  Treatment of the cause, if it is known.  Rest.  Raising (elevating) the joint.  Applying cold or hot packs to the joint.  Medicines to improve symptoms and reduce inflammation.  Injections of a steroid such as cortisone into the joint to help reduce pain and inflammation.  Depending on the cause of your arthritis, you may need to make lifestyle changes to reduce stress on your joint. These changes may include exercising more and losing weight. Follow these instructions at home: Medicines  Take over-the-counter and prescription medicines only as told by your health care provider.  Do not take aspirin to relieve pain if gout is suspected. Activity  Rest your joint if told by your health care provider. Rest is important when your disease is active and your joint feels painful, swollen, or stiff.  Avoid activities that make the pain worse. It is important to  balance activity with rest.  Exercise your joint regularly with range-of-motion exercises as told by your health care provider. Try doing low-impact exercise, such as: ? Swimming. ? Water aerobics. ? Biking. ? Walking. Joint Care   If your joint is swollen, keep it elevated if told by your health care provider.  If your joint feels stiff in the morning, try taking a warm shower.  If directed, apply heat to the joint. If you have diabetes, do not apply heat without permission from your health care provider. ? Put a towel between the joint and the hot pack or heating pad. ? Leave the heat on the area for 20-30 minutes.  If directed, apply ice to the joint: ? Put ice in a plastic bag. ? Place a towel between your skin and the bag. ? Leave the ice on for 20 minutes, 2-3 times per day.  Keep all follow-up visits as told by your health care provider. This is important. Contact a health care provider if:  The pain gets worse.  You have a fever. Get help right away if:  You develop severe joint pain, swelling, or redness.  Many joints become painful and swollen.  You develop severe back pain.  You develop severe weakness in your leg.  You cannot control your bladder or bowels. This information is not intended to replace advice given to you by your health care provider. Make sure you discuss any questions you have with your health care provider. Document Released: 03/28/2004 Document Revised: 07/27/2015  Document Reviewed: 05/16/2014 Elsevier Interactive Patient Education  [employer].

## 2016-12-31 NOTE — Progress Notes (Signed)
BP 126/75   Pulse 65   Temp 98.4 F (36.9 C)   Ht 5\' 1"  (1.549 m)   Wt 141 lb (64 kg)   LMP  (LMP Unknown)   SpO2 97%   BMI 26.64 kg/m    Subjective:    Patient ID: Tabitha Larson, female    DOB: [birth year], 67 y.o.   MRN: [id number]  HPI: Tabitha Larson is a 66 y.o. female  Chief Complaint  Patient presents with  . Depression    4 week f/up   . Knee Pain    pt states that her left knee has been hurting off and on   Depression Last visit we increased her Cymbalta from 30-60 mg.  This does not seem to help much.  Pt feels her problems and spiritual and her relationship to god.   Depression screen [facility] 2/9 12/31/2016 11/11/2016 05/06/2016 10/31/2015 05/01/2015  Decreased Interest 1 1 1 1 1   Down, Depressed, Hopeless 1 1 1 1 1   PHQ - 2 Score 2 2 2 2 2   Altered sleeping 1 1 1  - -  Tired, decreased energy 1 2 1  - -  Change in appetite 3 3 3  - -  Feeling bad or failure about yourself  2 1 2  - -  Trouble concentrating 2 1 2  - -  Moving slowly or fidgety/restless 2 1 1  - -  Suicidal thoughts 1 0 0 - -  PHQ-9 Score 14 11 12  - -   Knee pain Pt states she is having left knee pain.  Pain is off and on.  Yesterday didn't hurt at all but the day before that it hurt a lot.  Hurts more if on her feet more.  States it is an aching pain.  Aleve helps   Relevant past medical, surgical, family and social history reviewed and updated as indicated. Interim medical history since our last visit reviewed. Allergies and medications reviewed and updated.  Review of Systems  Per HPI unless specifically indicated above     Objective:    BP 126/75   Pulse 65   Temp 98.4 F (36.9 C)   Ht 5\' 1"  (1.549 m)   Wt 141 lb (64 kg)   LMP  (LMP Unknown)   SpO2 97%   BMI 26.64 kg/m   Wt Readings from Last 3 Encounters:  12/31/16 141 lb (64 kg)  11/11/16 139 lb 12.8 oz (63.4 kg)  06/17/16 145 lb 3.2 oz (65.9 kg)    Physical Exam  Results for orders placed or performed in visit on  05/06/16  Comprehensive metabolic panel  Result Value Ref Range   Glucose 93 65 - 99 mg/dL   BUN 15 8 - 27 mg/dL   Creatinine, Ser 0.66 0.57 - 1.00 mg/dL   GFR calc non Af Amer 92 >59 mL/min/1.73   GFR calc Af Amer 106 >59 mL/min/1.73   BUN/Creatinine Ratio 23 12 - 28   Sodium 140 134 - 144 mmol/L   Potassium 4.1 3.5 - 5.2 mmol/L   Chloride 99 96 - 106 mmol/L   CO2 23 18 - 29 mmol/L   Calcium 9.7 8.7 - 10.3 mg/dL   Total Protein 7.2 6.0 - 8.5 g/dL   Albumin 4.5 3.6 - 4.8 g/dL   Globulin, Total 2.7 1.5 - 4.5 g/dL   Albumin/Globulin Ratio 1.7 1.2 - 2.2   Bilirubin Total 0.3 0.0 - 1.2 mg/dL   Alkaline Phosphatase 83 39 - 117 IU/L   AST  15 0 - 40 IU/L   ALT 21 0 - 32 IU/L  Lipid Panel w/o Chol/HDL Ratio  Result Value Ref Range   Cholesterol, Total 185 100 - 199 mg/dL   Triglycerides 94 0 - 149 mg/dL   HDL 43 >39 mg/dL   VLDL Cholesterol Cal 19 5 - 40 mg/dL   LDL Calculated 123 (H) 0 - 99 mg/dL      Assessment & Plan:   Problem List Items Addressed This Visit      Unprioritized   Left knee pain    OK to take OTC NSAIDS,  As sparing as possible but take as needed.  Encouraged to stay active.        Relevant Orders   DG Knee Complete 4 Views Left   Major depression    No real help with medications.  [facility]  [facility] given          Follow up plan: Return in about 3 months (around 04/02/2017).

## 2016-12-31 NOTE — Assessment & Plan Note (Signed)
OK to take OTC NSAIDS,  As sparing as possible but take as needed.  Encouraged to stay active.

## 2017-01-01 ENCOUNTER — Other Ambulatory Visit: Payer: Self-pay | Admitting: Unknown Physician Specialty

## 2017-04-02 ENCOUNTER — Ambulatory Visit: Payer: Medicare Other | Admitting: Unknown Physician Specialty

## 2017-04-09 ENCOUNTER — Ambulatory Visit (INDEPENDENT_AMBULATORY_CARE_PROVIDER_SITE_OTHER): Payer: Medicare Other | Admitting: Unknown Physician Specialty

## 2017-04-09 ENCOUNTER — Encounter: Payer: Self-pay | Admitting: Unknown Physician Specialty

## 2017-04-09 VITALS — BP 126/80 | HR 57 | Temp 98.4°F | Wt 142.2 lb

## 2017-04-09 DIAGNOSIS — E7800 Pure hypercholesterolemia, unspecified: Secondary | ICD-10-CM | POA: Diagnosis not present

## 2017-04-09 DIAGNOSIS — F332 Major depressive disorder, recurrent severe without psychotic features: Secondary | ICD-10-CM

## 2017-04-09 DIAGNOSIS — I10 Essential (primary) hypertension: Secondary | ICD-10-CM

## 2017-04-09 DIAGNOSIS — R5383 Other fatigue: Secondary | ICD-10-CM

## 2017-04-09 MED ORDER — DULOXETINE HCL 60 MG PO CPEP: 60.0000 mg | ORAL_CAPSULE | Freq: Every day | ORAL | 1 refills | Status: DC

## 2017-04-09 MED ORDER — AMLODIPINE BESYLATE 5 MG PO TABS: 5.0000 mg | ORAL_TABLET | Freq: Every day | ORAL | 1 refills | Status: DC

## 2017-04-09 MED ORDER — HYDROCHLOROTHIAZIDE 25 MG PO TABS: 25.0000 mg | ORAL_TABLET | Freq: Every day | ORAL | 1 refills | Status: DC

## 2017-04-09 MED ORDER — ATORVASTATIN CALCIUM 40 MG PO TABS: 40.0000 mg | ORAL_TABLET | Freq: Every day | ORAL | 1 refills | Status: DC

## 2017-04-09 NOTE — Assessment & Plan Note (Signed)
Check lipids today.  On statin meds she takes daily most of the time

## 2017-04-09 NOTE — Assessment & Plan Note (Signed)
Stable, continue present medications.   

## 2017-04-09 NOTE — Addendum Note (Signed)
Addended by: Kathrine Haddock on: 04/09/2017 10:41 AM   Modules accepted: Orders

## 2017-04-09 NOTE — Assessment & Plan Note (Signed)
[facility].  Pt. Seems motivated to f/u on this

## 2017-04-09 NOTE — Progress Notes (Addendum)
BP 126/80   Pulse (!) 57   Temp 98.4 F (36.9 C) (Oral)   Wt 142 lb 3.2 oz (64.5 kg)   LMP  (LMP Unknown)   SpO2 98%   BMI 26.87 kg/m    Subjective:    Patient ID: Tabitha Larson, female    DOB: December 26, 1950, 67 y.o.   MRN: [id number]  HPI: Tabitha Larson is a 67 y.o. female  Chief Complaint  Patient presents with  . Depression  . Hyperlipidemia  . Hypertension   Hypertension Using medications without difficulty Average home BPs Not chekcing   No problems or lightheadedness No chest pain with exertion or shortness of breath No Edema   Hyperlipidemia Non-fasting Using medications without problems No Muscle aches  Diet compliance:Exercise: States she eats too much.  Not exercising.    Depression Still taking Duloxetine.  Last visit we talked about going to the [employer] counseling center as she feels her problems are mostly spiritual.   Fatigue symptoms come and go along with her depression.  Her biggest frustration is her appetite Depression screen [facility] 2/9 04/09/2017 12/31/2016 11/11/2016 05/06/2016 10/31/2015  Decreased Interest 1 1 1 1 1   Down, Depressed, Hopeless 1 1 1 1 1   PHQ - 2 Score 2 2 2 2 2   Altered sleeping 1 1 1 1  -  Tired, decreased energy 1 1 2 1  -  Change in appetite 3 3 3 3  -  Feeling bad or failure about yourself  1 2 1 2  -  Trouble concentrating 1 2 1 2  -  Moving slowly or fidgety/restless 0 2 1 1  -  Suicidal thoughts 0 1 0 0 -  PHQ-9 Score 9 14 11 12  -    Relevant past medical, surgical, family and social history reviewed and updated as indicated. Interim medical history since our last visit reviewed. Allergies and medications reviewed and updated.  Review of Systems  Constitutional: Negative.   HENT: Negative.   Respiratory: Negative.   Cardiovascular: Negative.     Per HPI unless specifically indicated above     Objective:    BP 126/80   Pulse (!) 57   Temp 98.4 F (36.9 C) (Oral)   Wt 142 lb 3.2 oz (64.5 kg)   LMP  (LMP  Unknown)   SpO2 98%   BMI 26.87 kg/m   Wt Readings from Last 3 Encounters:  04/09/17 142 lb 3.2 oz (64.5 kg)  12/31/16 141 lb (64 kg)  11/11/16 139 lb 12.8 oz (63.4 kg)    Physical Exam  Constitutional: She is oriented to person, place, and time. She appears well-developed and well-nourished. No distress.  HENT:  Head: Normocephalic and atraumatic.  Eyes: Conjunctivae and lids are normal. Right eye exhibits no discharge. Left eye exhibits no discharge. No scleral icterus.  Neck: Normal range of motion. Neck supple. No JVD present. Carotid bruit is not present.  Cardiovascular: Normal rate, regular rhythm and normal heart sounds.  Pulmonary/Chest: Effort normal and breath sounds normal.  Abdominal: Normal appearance. There is no splenomegaly or hepatomegaly.  Musculoskeletal: Normal range of motion.  Neurological: She is alert and oriented to person, place, and time.  Skin: Skin is warm, dry and intact. No rash noted. No pallor.  Psychiatric: She has a normal mood and affect. Her behavior is normal. Judgment and thought content normal.    Results for orders placed or performed in visit on 05/06/16  Comprehensive metabolic panel  Result Value Ref Range   Glucose 93  65 - 99 mg/dL   BUN 15 8 - 27 mg/dL   Creatinine, Ser 0.66 0.57 - 1.00 mg/dL   GFR calc non Af Amer 92 >59 mL/min/1.73   GFR calc Af Amer 106 >59 mL/min/1.73   BUN/Creatinine Ratio 23 12 - 28   Sodium 140 134 - 144 mmol/L   Potassium 4.1 3.5 - 5.2 mmol/L   Chloride 99 96 - 106 mmol/L   CO2 23 18 - 29 mmol/L   Calcium 9.7 8.7 - 10.3 mg/dL   Total Protein 7.2 6.0 - 8.5 g/dL   Albumin 4.5 3.6 - 4.8 g/dL   Globulin, Total 2.7 1.5 - 4.5 g/dL   Albumin/Globulin Ratio 1.7 1.2 - 2.2   Bilirubin Total 0.3 0.0 - 1.2 mg/dL   Alkaline Phosphatase 83 39 - 117 IU/L   AST 15 0 - 40 IU/L   ALT 21 0 - 32 IU/L  Lipid Panel w/o Chol/HDL Ratio  Result Value Ref Range   Cholesterol, Total 185 100 - 199 mg/dL   Triglycerides 94 0  - 149 mg/dL   HDL 43 >39 mg/dL   VLDL Cholesterol Cal 19 5 - 40 mg/dL   LDL Calculated 123 (H) 0 - 99 mg/dL      Assessment & Plan:   Problem List Items Addressed This Visit      Unprioritized   Benign hypertension    Stable, continue present medications.        Relevant Medications   amLODipine (NORVASC) 5 MG tablet   atorvastatin (LIPITOR) 40 MG tablet   hydrochlorothiazide (HYDRODIURIL) 25 MG tablet   Other Relevant Orders   Comprehensive metabolic panel   Lipid Panel w/o Chol/HDL Ratio   Hypercholesterolemia    Check lipids today.  On statin meds she takes daily most of the time      Relevant Medications   amLODipine (NORVASC) 5 MG tablet   atorvastatin (LIPITOR) 40 MG tablet   hydrochlorothiazide (HYDRODIURIL) 25 MG tablet   Other Relevant Orders   Lipid Panel w/o Chol/HDL Ratio   Major depression    [facility]  Pt. Seems motivated to f/u on this      Relevant Medications   DULoxetine (CYMBALTA) 60 MG capsule    Other Visit Diagnoses    Fatigue, unspecified type    -  Primary   Probably due to depression.  Will check CBC and TSH   Relevant Orders   CBC with Differential/Platelet   TSH      HM Planning to call for bone density and mammogram  Follow up plan: Return in about 6 months (around 10/07/2017).

## 2017-04-09 NOTE — Patient Instructions (Signed)
[facility] [phone]

## 2017-04-10 ENCOUNTER — Encounter: Payer: Self-pay | Admitting: Unknown Physician Specialty

## 2017-04-10 LAB — CBC WITH DIFFERENTIAL/PLATELET
Basophils Absolute: 0 10*3/uL (ref 0.0–0.2)
Basos: 0 %
EOS (ABSOLUTE): 0.1 10*3/uL (ref 0.0–0.4)
EOS: 1 %
HEMATOCRIT: 42.4 % (ref 34.0–46.6)
Hemoglobin: 14.1 g/dL (ref 11.1–15.9)
IMMATURE GRANS (ABS): 0 10*3/uL (ref 0.0–0.1)
Immature Granulocytes: 0 %
LYMPHS: 32 %
Lymphocytes Absolute: 1.9 10*3/uL (ref 0.7–3.1)
MCH: 29.3 pg (ref 26.6–33.0)
MCHC: 33.3 g/dL (ref 31.5–35.7)
MCV: 88 fL (ref 79–97)
MONOS ABS: 0.4 10*3/uL (ref 0.1–0.9)
Monocytes: 7 %
NEUTROS ABS: 3.7 10*3/uL (ref 1.4–7.0)
Neutrophils: 60 %
Platelets: 232 10*3/uL (ref 150–379)
RBC: 4.82 x10E6/uL (ref 3.77–5.28)
RDW: 15 % (ref 12.3–15.4)
WBC: 6.1 10*3/uL (ref 3.4–10.8)

## 2017-04-10 LAB — COMPREHENSIVE METABOLIC PANEL
ALK PHOS: 74 IU/L (ref 39–117)
ALT: 22 IU/L (ref 0–32)
AST: 18 IU/L (ref 0–40)
Albumin/Globulin Ratio: 1.8 (ref 1.2–2.2)
Albumin: 4.6 g/dL (ref 3.6–4.8)
BUN/Creatinine Ratio: 23 (ref 12–28)
BUN: 14 mg/dL (ref 8–27)
Bilirubin Total: 0.4 mg/dL (ref 0.0–1.2)
CHLORIDE: 100 mmol/L (ref 96–106)
CO2: 23 mmol/L (ref 20–29)
Calcium: 9.5 mg/dL (ref 8.7–10.3)
Creatinine, Ser: 0.6 mg/dL (ref 0.57–1.00)
GFR calc Af Amer: 108 mL/min/{1.73_m2} (ref >59)
GFR calc non Af Amer: 94 mL/min/{1.73_m2} (ref >59)
GLUCOSE: 75 mg/dL (ref 65–99)
Globulin, Total: 2.5 g/dL (ref 1.5–4.5)
Potassium: 3.9 mmol/L (ref 3.5–5.2)
Sodium: 140 mmol/L (ref 134–144)
Total Protein: 7.1 g/dL (ref 6.0–8.5)

## 2017-04-10 LAB — LIPID PANEL W/O CHOL/HDL RATIO
CHOLESTEROL TOTAL: 189 mg/dL (ref 100–199)
HDL: 43 mg/dL (ref >39)
LDL Calculated: 123 mg/dL — ABNORMAL HIGH (ref 0–99)
Triglycerides: 113 mg/dL (ref 0–149)
VLDL CHOLESTEROL CAL: 23 mg/dL (ref 5–40)

## 2017-04-10 LAB — TSH: TSH: 2.18 u[IU]/mL (ref 0.450–4.500)

## 2017-05-08 ENCOUNTER — Ambulatory Visit (INDEPENDENT_AMBULATORY_CARE_PROVIDER_SITE_OTHER): Payer: Medicare Other

## 2017-05-08 VITALS — BP 121/80 | HR 63 | Temp 98.5°F | Resp 16 | Ht 61.0 in | Wt 139.0 lb

## 2017-05-08 DIAGNOSIS — Z0000 Encounter for general adult medical examination without abnormal findings: Secondary | ICD-10-CM

## 2017-05-08 NOTE — Progress Notes (Signed)
Subjective:   Tabitha Larson is a 67 y.o. female who presents for Medicare Annual (Subsequent) preventive examination.  Review of Systems:   Cardiac Risk Factors include: hypertension;advanced age (>29men, >63 women);dyslipidemia     Objective:     Vitals: BP 121/80 (BP Location: Left Arm, Patient Position: Sitting)   Pulse 63   Temp 98.5 F (36.9 C) (Temporal)   Resp 16   Ht 5\' 1"  (1.549 m)   Wt 139 lb (63 kg)   LMP  (LMP Unknown)   BMI 26.26 kg/m   Body mass index is 26.26 kg/m.  Advanced Directives 05/08/2017 05/06/2016 03/26/2016 07/28/2015 05/01/2015 05/01/2015  Does Patient Have a Medical Advance Directive? No No No No No No  Would patient like information on creating a medical advance directive? Yes (MAU/Ambulatory/Procedural Areas - Information given) (No Data) Yes (MAU/Ambulatory/Procedural Areas - Information given) - No - patient declined information -    Tobacco Social History   Tobacco Use  Smoking Status Former Smoker  . Types: Cigarettes  . Last attempt to quit: 01/05/1984  . Years since quitting: 33.3  Smokeless Tobacco Never Used     Counseling given: Not Answered   Clinical Intake:  Pre-visit preparation completed: Yes  Pain : No/denies pain     Nutritional Status: BMI 25 -29 Overweight Nutritional Risks: None Diabetes: No  How often do you need to have someone help you when you read instructions, pamphlets, or other written materials from your doctor or pharmacy?: 1 - Never What is the last grade level you completed in school?: high school   Interpreter Needed?: No  Information entered by :: Jawanda Passey,LPN   Past Medical History:  Diagnosis Date  . Ankle fracture   . Anxiety   . Depression   . GERD (gastroesophageal reflux disease)   . Hyperlipidemia   . Hypertension   . Migraine   . Osteoporosis   . PONV (postoperative nausea and vomiting)    after ankle repair   . Wears dentures    full upper   Past Surgical History:    Procedure Laterality Date  . ANKLE FRACTURE SURGERY    . CATARACT EXTRACTION Right 01/2014  . CATARACT EXTRACTION W/PHACO Left 03/26/2016   Procedure: CATARACT EXTRACTION PHACO AND INTRAOCULAR LENS PLACEMENT ([place])  left;  Surgeon: Eulogio Bear, MD;  Location: [facility];  Service: Ophthalmology;  Laterality: Left;  . COLONOSCOPY  02/04/2014  . COLONOSCOPY WITH PROPOFOL N/A 07/28/2015   Procedure: COLONOSCOPY WITH PROPOFOL;  Surgeon: Lucilla Lame, MD;  Location: [facility];  Service: Endoscopy;  Laterality: N/A;  No anesthesia  . MANDIBLE SURGERY    . POLYPECTOMY  07/28/2015   Procedure: POLYPECTOMY;  Surgeon: Lucilla Lame, MD;  Location: [facility];  Service: Endoscopy;;   Family History  Problem Relation Age of Onset  . Stroke Mother   . Prostate cancer Father   . Colon cancer Sister   . Hypertension Sister   . Hyperlipidemia Sister    Social History   Socioeconomic History  . Marital status: Single    Spouse name: None  . Number of children: None  . Years of education: None  . Highest education level: None  Social Needs  . Financial resource strain: Not hard at all  . Food insecurity - worry: Never true  . Food insecurity - inability: Never true  . Transportation needs - medical: No  . Transportation needs - non-medical: No  Occupational History  . None  Tobacco Use  . Smoking status: Former Smoker    Types: Cigarettes    Last attempt to quit: 01/05/1984    Years since quitting: 33.3  . Smokeless tobacco: Never Used  Substance and Sexual Activity  . Alcohol use: No    Alcohol/week: 0.0 oz  . Drug use: No  . Sexual activity: Not Currently    Birth control/protection: Post-menopausal  Other Topics Concern  . None  Social History Narrative  . None    Outpatient Encounter Medications as of 05/08/2017  Medication Sig  . amLODipine (NORVASC) 5 MG tablet Take 1 tablet (5 mg total) by mouth daily.  Marland Kitchen atorvastatin (LIPITOR) 40 MG tablet Take  1 tablet (40 mg total) by mouth daily.  . DULoxetine (CYMBALTA) 60 MG capsule Take 1 capsule (60 mg total) by mouth daily.  . hydrochlorothiazide (HYDRODIURIL) 25 MG tablet Take 1 tablet (25 mg total) by mouth daily.   No facility-administered encounter medications on file as of 05/08/2017.     Activities of Daily Living In your present state of health, do you have any difficulty performing the following activities: 05/08/2017  Hearing? N  Vision? Y  Comment reading  Difficulty concentrating or making decisions? Y  Comment difficulty concentrating   Walking or climbing stairs? N  Dressing or bathing? N  Doing errands, shopping? N  Preparing Food and eating ? N  Using the Toilet? N  In the past six months, have you accidently leaked urine? Y  Comment pads  Do you have problems with loss of bowel control? N  Managing your Medications? N  Managing your Finances? N  Housekeeping or managing your Housekeeping? N  Some recent data might be hidden    Patient Care Team: Kathrine Haddock, NP as PCP - General (Nurse Practitioner) Eulogio Bear, MD as Consulting Physician (Ophthalmology)    Assessment:   This is a routine wellness examination for Jon.  Exercise Activities and Dietary recommendations Current Exercise Habits: The patient does not participate in regular exercise at present, Exercise limited by: None identified  Goals    . DIET - INCREASE WATER INTAKE     Recommend drinking at least 6-8 glasses of water        Fall Risk Fall Risk  05/08/2017 11/11/2016 05/06/2016 05/01/2015 01/24/2015  Falls in the past year? No No Yes No No  Number falls in past yr: - - 2 or more - -  Injury with Fall? - - No - -   Is the patient's home free of loose throw rugs in walkways, pet beds, electrical cords, etc?   yes      Grab bars in the bathroom? no      Handrails on the stairs?   yes      Adequate lighting?   yes  Timed Get Up and Go performed: Completed in 8 seconds with no use  of assistive devices, steady gait. No intervention needed at this time.   Depression Screen PHQ 2/9 Scores 05/08/2017 04/09/2017 12/31/2016 11/11/2016  PHQ - 2 Score 2 2 2 2   PHQ- 9 Score 11 9 14 11      Cognitive Function     6CIT Screen 05/08/2017  What Year? 0 points  What month? 0 points  What time? 0 points  Count back from 20 0 points  Months in reverse 0 points  Repeat phrase 0 points  Total Score 0    Immunization History  Administered Date(s) Administered  . DTaP 08/27/2010  . Influenza,  High Dose Seasonal PF 11/11/2016  . Influenza,inj,Quad PF,6+ Mos 01/24/2015  . Influenza-Unspecified 02/11/2014, 12/12/2015  . Pneumococcal Conjugate-13 02/11/2014  . Pneumococcal Polysaccharide-23 05/01/2015  . Tdap 08/27/2010  . Zoster 09/11/2011    Qualifies for Shingles Vaccine? Yes, discussed shingrix vaccine.   Screening Tests Health Maintenance  Topic Date Due  . DEXA SCAN  12/11/2013  . MAMMOGRAM  05/14/2015  . COLONOSCOPY  07/27/2020  . TETANUS/TDAP  08/26/2020  . INFLUENZA VACCINE  Completed  . Hepatitis C Screening  Completed  . PNA vac Low Risk Adult  Completed    Cancer Screenings: Lung: Low Dose CT Chest recommended if Age 43-80 years, 30 pack-year currently smoking OR have quit w/in 15years. Patient does not qualify. Breast:  Up to date on Mammogram? No , ordered Up to date of Bone Density/Dexa? No, ordered Colorectal: completed 07/28/2015  Additional Screenings:  Hepatitis B/HIV/Syphillis: not indicated  Hepatitis C Screening: completed 05/01/2015     Plan:    I have personally reviewed and addressed the Medicare Annual Wellness questionnaire and have noted the following in the patient's chart:  A. Medical and social history B. Use of alcohol, tobacco or illicit drugs  C. Current medications and supplements D. Functional ability and status E.  Nutritional status F.  Physical activity G. Advance directives H. List of other physicians I.    Hospitalizations, surgeries, and ER visits in previous 12 months J.  [place] such as hearing and vision if needed, cognitive and depression L. Referrals and appointments   In addition, I have reviewed and discussed with patient certain preventive protocols, quality metrics, and best practice recommendations. A written personalized care plan for preventive services as well as general preventive health recommendations were provided to patient.   Signed,  Tyler Aas, LPN Nurse Health Advisor   Nurse Notes: swelling in bilateral ankles when standing for long periods. Advised to prop legs up when resting. Her next follow up is 10/2017 with Regino Schultze.

## 2017-05-08 NOTE — Patient Instructions (Signed)
Tabitha Larson , Thank you for taking time to come for your Medicare Wellness Visit. I appreciate your ongoing commitment to your health goals. Please review the following plan we discussed and let me know if I can assist you in the future.   Screening recommendations/referrals: Colonoscopy: completed 07/28/2015 Mammogram: due now. Please call [phone] to schedule your mammogram.  Bone Density: due now Please call [phone] to schedule.  Recommended yearly ophthalmology/optometry visit for glaucoma screening and checkup Recommended yearly dental visit for hygiene and checkup  Vaccinations: Influenza vaccine: up to date  Pneumococcal vaccine: up to date  Tdap vaccine: up to date  Shingles vaccine: shingrix eligible, call your insurance company for coverage information.   Advanced directives: Advance directive discussed with you today. I have provided a copy for you to complete at home and have notarized. Once this is complete please bring a copy in to our office so we can scan it into your chart.  Conditions/risks identified: Recommend drinking at least 6-8 glasses of water   Next appointment: Follow up on 10/08/2017 at 8:30am with Regino Schultze. Follow up in one year for your annual wellness exam.    Preventive Care 65 Years and Older, Female Preventive care refers to lifestyle choices and visits with your health care provider that can promote health and wellness. What does preventive care include?  A yearly physical exam. This is also called an annual well check.  Dental exams once or twice a year.  Routine eye exams. Ask your health care provider how often you should have your eyes checked.  Personal lifestyle choices, including:  Daily care of your teeth and gums.  Regular physical activity.  Eating a healthy diet.  Avoiding tobacco and drug use.  Limiting alcohol use.  Practicing safe sex.  Taking low-dose aspirin every day.  Taking vitamin and mineral  supplements as recommended by your health care provider. What happens during an annual well check? The services and screenings done by your health care provider during your annual well check will depend on your age, overall health, lifestyle risk factors, and family history of disease. Counseling  Your health care provider may ask you questions about your:  Alcohol use.  Tobacco use.  Drug use.  Emotional well-being.  Home and relationship well-being.  Sexual activity.  Eating habits.  History of falls.  Memory and ability to understand (cognition).  Work and work Statistician.  Reproductive health. Screening  You may have the following tests or measurements:  Height, weight, and BMI.  Blood pressure.  Lipid and cholesterol levels. These may be checked every 5 years, or more frequently if you are over 45 years old.  Skin check.  Lung cancer screening. You may have this screening every year starting at age 47 if you have a 30-pack-year history of smoking and currently smoke or have quit within the past 15 years.  Fecal occult blood test (FOBT) of the stool. You may have this test every year starting at age 46.  Flexible sigmoidoscopy or colonoscopy. You may have a sigmoidoscopy every 5 years or a colonoscopy every 10 years starting at age 25.  Hepatitis C blood test.  Hepatitis B blood test.  Sexually transmitted disease (STD) testing.  Diabetes screening. This is done by checking your blood sugar (glucose) after you have not eaten for a while (fasting). You may have this done every 1-3 years.  Bone density scan. This is done to screen for osteoporosis. You may have this done starting at age  65.  Mammogram. This may be done every 1-2 years. Talk to your health care provider about how often you should have regular mammograms. Talk with your health care provider about your test results, treatment options, and if necessary, the need for more tests. Vaccines  Your  health care provider may recommend certain vaccines, such as:  Influenza vaccine. This is recommended every year.  Tetanus, diphtheria, and acellular pertussis (Tdap, Td) vaccine. You may need a Td booster every 10 years.  Zoster vaccine. You may need this after age 43.  Pneumococcal 13-valent conjugate (PCV13) vaccine. One dose is recommended after age 93.  Pneumococcal polysaccharide (PPSV23) vaccine. One dose is recommended after age 60. Talk to your health care provider about which screenings and vaccines you need and how often you need them. This information is not intended to replace advice given to you by your health care provider. Make sure you discuss any questions you have with your health care provider. Document Released: 03/17/2015 Document Revised: 11/08/2015 Document Reviewed: 12/20/2014 Elsevier Interactive Patient Education  2017 [employer] Prevention in the Home Falls can cause injuries. They can happen to people of all ages. There are many things you can do to make your home safe and to help prevent falls. What can I do on the outside of my home?  Regularly fix the edges of walkways and driveways and fix any cracks.  Remove anything that might make you trip as you walk through a door, such as a raised step or threshold.  Trim any bushes or trees on the path to your home.  Use bright outdoor lighting.  Clear any walking paths of anything that might make someone trip, such as rocks or tools.  Regularly check to see if handrails are loose or broken. Make sure that both sides of any steps have handrails.  Any raised decks and porches should have guardrails on the edges.  Have any leaves, snow, or ice cleared regularly.  Use sand or salt on walking paths during winter.  Clean up any spills in your garage right away. This includes oil or grease spills. What can I do in the bathroom?  Use night lights.  Install grab bars by the toilet and in the tub and  shower. Do not use towel bars as grab bars.  Use non-skid mats or decals in the tub or shower.  If you need to sit down in the shower, use a plastic, non-slip stool.  Keep the floor dry. Clean up any water that spills on the floor as soon as it happens.  Remove soap buildup in the tub or shower regularly.  Attach bath mats securely with double-sided non-slip rug tape.  Do not have throw rugs and other things on the floor that can make you trip. What can I do in the bedroom?  Use night lights.  Make sure that you have a light by your bed that is easy to reach.  Do not use any sheets or blankets that are too big for your bed. They should not hang down onto the floor.  Have a firm chair that has side arms. You can use this for support while you get dressed.  Do not have throw rugs and other things on the floor that can make you trip. What can I do in the kitchen?  Clean up any spills right away.  Avoid walking on wet floors.  Keep items that you use a lot in easy-to-reach places.  If you need  to reach something above you, use a strong step stool that has a grab bar.  Keep electrical cords out of the way.  Do not use floor polish or wax that makes floors slippery. If you must use wax, use non-skid floor wax.  Do not have throw rugs and other things on the floor that can make you trip. What can I do with my stairs?  Do not leave any items on the stairs.  Make sure that there are handrails on both sides of the stairs and use them. Fix handrails that are broken or loose. Make sure that handrails are as long as the stairways.  Check any carpeting to make sure that it is firmly attached to the stairs. Fix any carpet that is loose or worn.  Avoid having throw rugs at the top or bottom of the stairs. If you do have throw rugs, attach them to the floor with carpet tape.  Make sure that you have a light switch at the top of the stairs and the bottom of the stairs. If you do not  have them, ask someone to add them for you. What else can I do to help prevent falls?  Wear shoes that:  Do not have high heels.  Have rubber bottoms.  Are comfortable and fit you well.  Are closed at the toe. Do not wear sandals.  If you use a stepladder:  Make sure that it is fully opened. Do not climb a closed stepladder.  Make sure that both sides of the stepladder are locked into place.  Ask someone to hold it for you, if possible.  Clearly mark and make sure that you can see:  Any grab bars or handrails.  First and last steps.  Where the edge of each step is.  Use tools that help you move around (mobility aids) if they are needed. These include:  Canes.  Walkers.  Scooters.  Crutches.  Turn on the lights when you go into a dark area. Replace any light bulbs as soon as they burn out.  Set up your furniture so you have a clear path. Avoid moving your furniture around.  If any of your floors are uneven, fix them.  If there are any pets around you, be aware of where they are.  Review your medicines with your doctor. Some medicines can make you feel dizzy. This can increase your chance of falling. Ask your doctor what other things that you can do to help prevent falls. This information is not intended to replace advice given to you by your health care provider. Make sure you discuss any questions you have with your health care provider. Document Released: 12/15/2008 Document Revised: 07/27/2015 Document Reviewed: 03/25/2014 Elsevier Interactive Patient Education  2017 [employer].

## 2017-05-27 ENCOUNTER — Ambulatory Visit
Admission: RE | Admit: 2017-05-27 | Discharge: 2017-05-27 | Disposition: A | Discharge status: Home / Self Care | Payer: Medicare Other | Source: Ambulatory Visit | Attending: Unknown Physician Specialty | Admitting: Unknown Physician Specialty

## 2017-05-27 DIAGNOSIS — Z1231 Encounter for screening mammogram for malignant neoplasm of breast: Secondary | ICD-10-CM | POA: Insufficient documentation

## 2017-05-27 DIAGNOSIS — Z0000 Encounter for general adult medical examination without abnormal findings: Secondary | ICD-10-CM

## 2017-05-27 DIAGNOSIS — Z13820 Encounter for screening for osteoporosis: Secondary | ICD-10-CM | POA: Diagnosis present

## 2017-05-27 DIAGNOSIS — M8580 Other specified disorders of bone density and structure, unspecified site: Secondary | ICD-10-CM | POA: Insufficient documentation

## 2017-05-27 DIAGNOSIS — M810 Age-related osteoporosis without current pathological fracture: Secondary | ICD-10-CM

## 2017-06-02 ENCOUNTER — Encounter: Payer: Self-pay | Admitting: Unknown Physician Specialty

## 2017-09-16 ENCOUNTER — Encounter: Payer: Self-pay | Admitting: Unknown Physician Specialty

## 2017-10-08 ENCOUNTER — Ambulatory Visit: Payer: Medicare Other | Admitting: Physician Assistant

## 2017-10-08 ENCOUNTER — Encounter: Payer: Self-pay | Admitting: Physician Assistant

## 2017-10-08 ENCOUNTER — Other Ambulatory Visit: Payer: Self-pay

## 2017-10-08 ENCOUNTER — Ambulatory Visit: Payer: Medicare Other | Admitting: Unknown Physician Specialty

## 2017-10-08 VITALS — BP 110/73 | HR 64 | Temp 97.9°F | Ht 62.0 in | Wt 139.6 lb

## 2017-10-08 DIAGNOSIS — F332 Major depressive disorder, recurrent severe without psychotic features: Secondary | ICD-10-CM

## 2017-10-08 DIAGNOSIS — I10 Essential (primary) hypertension: Secondary | ICD-10-CM | POA: Diagnosis not present

## 2017-10-08 DIAGNOSIS — E7800 Pure hypercholesterolemia, unspecified: Secondary | ICD-10-CM | POA: Diagnosis not present

## 2017-10-08 DIAGNOSIS — I6529 Occlusion and stenosis of unspecified carotid artery: Secondary | ICD-10-CM

## 2017-10-08 NOTE — Progress Notes (Signed)
Subjective:    Patient ID: Tabitha Larson, female    DOB: 22-Jul-1950, 67 y.o.   MRN: [id number]  Tabitha Larson is a 67 y.o. female presenting on 10/08/2017 for Depression; Hypertension; and Hyperlipidemia   HPI   HTN: well controlled today on 5 mg amlodipine and HCTZ 25 mg daily.   BP Readings from Last 3 Encounters:  10/08/17 110/73  05/08/17 121/80  04/09/17 126/80     HLD: Cholesterol stable on Lipitor 40 mg.   Lipid Panel     Component Value Date/Time   CHOL 189 04/09/2017 1030   CHOL 185 01/24/2015 0831   TRIG 113 04/09/2017 1030   TRIG 133 01/24/2015 0831   HDL 43 04/09/2017 1030   VLDL 27 01/24/2015 0831   LDLCALC 123 (H) 04/09/2017 1030    Depression: Patient reports she wants to come off her depression medication because she has been on it a long time and is worried about adverse effects. She reports she feels a lot of her issues are spiritual and the medication may not be helping. She reports she has considered [place] counseling prior but has not initiated therapy yet. PHQ9 today is 11.    Office Visit from 10/08/2017 in [place]  PHQ-9 Total Score  11     Carotid Artery Calcification: Patient brings a note from her dentist after a panoramic film noted what looked to be like carotid artery calcification. She denies symptoms of sudden one sided vision loss, dizziness, falls, weakness of one side of her face.   Social History   Tobacco Use  . Smoking status: Former Smoker    Types: Cigarettes    Last attempt to quit: 01/05/1984    Years since quitting: 33.7  . Smokeless tobacco: Never Used  Substance Use Topics  . Alcohol use: No    Alcohol/week: 0.0 standard drinks  . Drug use: No    Review of Systems Per HPI unless specifically indicated above     Objective:    BP 110/73   Pulse 64   Temp 97.9 F (36.6 C) (Oral)   Ht 5\' 2"  (1.575 m)   Wt 139 lb 9.6 oz (63.3 kg)   LMP  (LMP Unknown)   SpO2 96%   BMI 25.53 kg/m   Wt  Readings from Last 3 Encounters:  10/08/17 139 lb 9.6 oz (63.3 kg)  05/08/17 139 lb (63 kg)  04/09/17 142 lb 3.2 oz (64.5 kg)    Physical Exam  Constitutional: She is oriented to person, place, and time. She appears well-developed and well-nourished.  Neck: Normal carotid pulses present. Carotid bruit is not present.  Cardiovascular: Normal rate and regular rhythm.  Pulmonary/Chest: Effort normal and breath sounds normal.  Neurological: She is alert and oriented to person, place, and time.  Skin: Skin is warm and dry.  Psychiatric: She has a normal mood and affect. Her behavior is normal.     Office Visit from 10/08/2017 in [facility] Total Score  11      Results for orders placed or performed in visit on 04/09/17  Comprehensive metabolic panel  Result Value Ref Range   Glucose 75 65 - 99 mg/dL   BUN 14 8 - 27 mg/dL   Creatinine, Ser 0.60 0.57 - 1.00 mg/dL   GFR calc non Af Amer 94 >59 mL/min/1.73   GFR calc Af Amer 108 >59 mL/min/1.73   BUN/Creatinine Ratio 23 12 - 28   Sodium 140 134 -  144 mmol/L   Potassium 3.9 3.5 - 5.2 mmol/L   Chloride 100 96 - 106 mmol/L   CO2 23 20 - 29 mmol/L   Calcium 9.5 8.7 - 10.3 mg/dL   Total Protein 7.1 6.0 - 8.5 g/dL   Albumin 4.6 3.6 - 4.8 g/dL   Globulin, Total 2.5 1.5 - 4.5 g/dL   Albumin/Globulin Ratio 1.8 1.2 - 2.2   Bilirubin Total 0.4 0.0 - 1.2 mg/dL   Alkaline Phosphatase 74 39 - 117 IU/L   AST 18 0 - 40 IU/L   ALT 22 0 - 32 IU/L  Lipid Panel w/o Chol/HDL Ratio  Result Value Ref Range   Cholesterol, Total 189 100 - 199 mg/dL   Triglycerides 113 0 - 149 mg/dL   HDL 43 >39 mg/dL   VLDL Cholesterol Cal 23 5 - 40 mg/dL   LDL Calculated 123 (H) 0 - 99 mg/dL  CBC with Differential/Platelet  Result Value Ref Range   WBC 6.1 3.4 - 10.8 x10E3/uL   RBC 4.82 3.77 - 5.28 x10E6/uL   Hemoglobin 14.1 11.1 - 15.9 g/dL   Hematocrit 42.4 34.0 - 46.6 %   MCV 88 79 - 97 fL   MCH 29.3 26.6 - 33.0 pg   MCHC 33.3 31.5 - 35.7  g/dL   RDW 15.0 12.3 - 15.4 %   Platelets 232 150 - 379 x10E3/uL   Neutrophils 60 Not Estab. %   Lymphs 32 Not Estab. %   Monocytes 7 Not Estab. %   Eos 1 Not Estab. %   Basos 0 Not Estab. %   Neutrophils Absolute 3.7 1.4 - 7.0 x10E3/uL   Lymphocytes Absolute 1.9 0.7 - 3.1 x10E3/uL   Monocytes Absolute 0.4 0.1 - 0.9 x10E3/uL   EOS (ABSOLUTE) 0.1 0.0 - 0.4 x10E3/uL   Basophils Absolute 0.0 0.0 - 0.2 x10E3/uL   Immature Granulocytes 0 Not Estab. %   Immature Grans (Abs) 0.0 0.0 - 0.1 x10E3/uL  TSH  Result Value Ref Range   TSH 2.180 0.450 - 4.500 uIU/mL      Assessment & Plan:  1. Severe episode of recurrent major depressive disorder, without psychotic features [facility])   Patient mentions wanting to discontinue her depression medication because she has been on it too long. Her PHQ is 11 today. She is not currently in counseling. She thinks most of her issues are spiritual and wants to see a Marketing executive. I do not recommend discontinuing her medication as she does not seem to have the support in place to compensate for this.   - Ambulatory referral to [facility]  2. Hypercholesterolemia  Continue lipitor 40 mg. Cholesterol slightly above goal, patient declines increasing lipitor.   3. Benign hypertension  BP controlled on amlodipine 5 mg and hctz 25 mg daily. Continue.  4. Carotid artery calcification, unspecified laterality  Patient went to the dentist and presents with panoramic film revealing possible carotid artery calcifications. Patient does not have any symptoms related to blockage or exam findings. Offered carotid doppler for reassurance, but patient wishes to wait.   Follow up plan: Return in about 3 months (around 01/08/2018) for htn, hld .  Carles Collet, PA-C [facility] 10/09/2017, 9:02 AM

## 2017-10-08 NOTE — Patient Instructions (Signed)

## 2017-10-17 ENCOUNTER — Ambulatory Visit: Payer: Medicare Other | Admitting: Unknown Physician Specialty

## 2017-10-17 ENCOUNTER — Encounter: Payer: Self-pay | Admitting: Unknown Physician Specialty

## 2017-10-17 VITALS — BP 113/74 | HR 73 | Temp 97.8°F | Wt 138.4 lb

## 2017-10-17 DIAGNOSIS — R300 Dysuria: Secondary | ICD-10-CM | POA: Diagnosis not present

## 2017-10-17 DIAGNOSIS — N3000 Acute cystitis without hematuria: Secondary | ICD-10-CM

## 2017-10-17 LAB — UA/M W/RFLX CULTURE, ROUTINE
Bilirubin, UA: NEGATIVE
Glucose, UA: NEGATIVE
Ketones, UA: NEGATIVE
Nitrite, UA: POSITIVE — AB
PH UA: 7 (ref 5.0–7.5)
Specific Gravity, UA: 1.015 (ref 1.005–1.030)
Urobilinogen, Ur: 1 mg/dL (ref 0.2–1.0)

## 2017-10-17 LAB — MICROSCOPIC EXAMINATION: WBC, UA: >30 /hpf — AB (ref 0–5)

## 2017-10-17 MED ORDER — SULFAMETHOXAZOLE-TRIMETHOPRIM 800-160 MG PO TABS: 1.0000 | ORAL_TABLET | Freq: Two times a day (BID) | ORAL | 0 refills | Status: DC

## 2017-10-17 NOTE — Progress Notes (Signed)
BP 113/74 (BP Location: Left Arm, Patient Position: Sitting, Cuff Size: Normal)   Pulse 73   Temp 97.8 F (36.6 C)   Wt 138 lb 6 oz (62.8 kg)   LMP  (LMP Unknown)   SpO2 97%   BMI 25.31 kg/m    Subjective:    Patient ID: Tabitha Larson, female    DOB: Sep 30, 1950, 67 y.o.   MRN: [id number]  HPI: Tabitha Larson is a 67 y.o. female  Chief Complaint  Patient presents with  . Dysuria  . Hematuria   Dysuria   This is a new problem. Episode onset: 2 weeks. The problem occurs intermittently. The problem has been waxing and waning. The quality of the pain is described as aching and burning. There has been no fever. She is not sexually active. There is no history of pyelonephritis. Associated symptoms include frequency, hematuria, hesitancy and urgency. Pertinent negatives include no chills, discharge, flank pain, nausea, possible pregnancy, sweats or vomiting. She has tried nothing for the symptoms. There is no history of catheterization, kidney stones, recurrent UTIs, urinary stasis or a urological procedure.     Relevant past medical, surgical, family and social history reviewed and updated as indicated. Interim medical history since our last visit reviewed. Allergies and medications reviewed and updated.  Review of Systems  Constitutional: Negative for chills.  Gastrointestinal: Negative for nausea and vomiting.  Genitourinary: Positive for dysuria, frequency, hematuria, hesitancy and urgency. Negative for flank pain.    Per HPI unless specifically indicated above     Objective:    BP 113/74 (BP Location: Left Arm, Patient Position: Sitting, Cuff Size: Normal)   Pulse 73   Temp 97.8 F (36.6 C)   Wt 138 lb 6 oz (62.8 kg)   LMP  (LMP Unknown)   SpO2 97%   BMI 25.31 kg/m   Wt Readings from Last 3 Encounters:  10/17/17 138 lb 6 oz (62.8 kg)  10/08/17 139 lb 9.6 oz (63.3 kg)  05/08/17 139 lb (63 kg)    Physical Exam  Constitutional: She is oriented to person,  place, and time. She appears well-developed and well-nourished. No distress.  HENT:  Head: Normocephalic and atraumatic.  Eyes: Conjunctivae and lids are normal. Right eye exhibits no discharge. Left eye exhibits no discharge. No scleral icterus.  Cardiovascular: Normal rate and regular rhythm.  Pulmonary/Chest: Effort normal. No respiratory distress.  Abdominal: Soft. Normal appearance and bowel sounds are normal. She exhibits no distension. There is no splenomegaly or hepatomegaly. There is no tenderness. There is no guarding and no CVA tenderness.  Musculoskeletal: Normal range of motion.  Neurological: She is alert and oriented to person, place, and time.  Skin: Skin is intact. No rash noted. No pallor.  Psychiatric: She has a normal mood and affect. Her behavior is normal. Judgment and thought content normal.  Nursing note and vitals reviewed.   Results for orders placed or performed in visit on 04/09/17  Comprehensive metabolic panel  Result Value Ref Range   Glucose 75 65 - 99 mg/dL   BUN 14 8 - 27 mg/dL   Creatinine, Ser 0.60 0.57 - 1.00 mg/dL   GFR calc non Af Amer 94 >59 mL/min/1.73   GFR calc Af Amer 108 >59 mL/min/1.73   BUN/Creatinine Ratio 23 12 - 28   Sodium 140 134 - 144 mmol/L   Potassium 3.9 3.5 - 5.2 mmol/L   Chloride 100 96 - 106 mmol/L   CO2 23 20 - 29 mmol/L  Calcium 9.5 8.7 - 10.3 mg/dL   Total Protein 7.1 6.0 - 8.5 g/dL   Albumin 4.6 3.6 - 4.8 g/dL   Globulin, Total 2.5 1.5 - 4.5 g/dL   Albumin/Globulin Ratio 1.8 1.2 - 2.2   Bilirubin Total 0.4 0.0 - 1.2 mg/dL   Alkaline Phosphatase 74 39 - 117 IU/L   AST 18 0 - 40 IU/L   ALT 22 0 - 32 IU/L  Lipid Panel w/o Chol/HDL Ratio  Result Value Ref Range   Cholesterol, Total 189 100 - 199 mg/dL   Triglycerides 113 0 - 149 mg/dL   HDL 43 >39 mg/dL   VLDL Cholesterol Cal 23 5 - 40 mg/dL   LDL Calculated 123 (H) 0 - 99 mg/dL  CBC with Differential/Platelet  Result Value Ref Range   WBC 6.1 3.4 - 10.8 x10E3/uL    RBC 4.82 3.77 - 5.28 x10E6/uL   Hemoglobin 14.1 11.1 - 15.9 g/dL   Hematocrit 42.4 34.0 - 46.6 %   MCV 88 79 - 97 fL   MCH 29.3 26.6 - 33.0 pg   MCHC 33.3 31.5 - 35.7 g/dL   RDW 15.0 12.3 - 15.4 %   Platelets 232 150 - 379 x10E3/uL   Neutrophils 60 Not Estab. %   Lymphs 32 Not Estab. %   Monocytes 7 Not Estab. %   Eos 1 Not Estab. %   Basos 0 Not Estab. %   Neutrophils Absolute 3.7 1.4 - 7.0 x10E3/uL   Lymphocytes Absolute 1.9 0.7 - 3.1 x10E3/uL   Monocytes Absolute 0.4 0.1 - 0.9 x10E3/uL   EOS (ABSOLUTE) 0.1 0.0 - 0.4 x10E3/uL   Basophils Absolute 0.0 0.0 - 0.2 x10E3/uL   Immature Granulocytes 0 Not Estab. %   Immature Grans (Abs) 0.0 0.0 - 0.1 x10E3/uL  TSH  Result Value Ref Range   TSH 2.180 0.450 - 4.500 uIU/mL      Assessment & Plan:   Problem List Items Addressed This Visit    None    Visit Diagnoses    Dysuria    -  Primary   Relevant Orders   UA/M w/rflx Culture, Routine   Urine Culture   Acute cystitis without hematuria       Markedly positive urine.  Send for culture and sensitivity.  Rx for Bactrim BID for 5 days.  Drink lots of fluids.  Recommended Cranberry juice.     Relevant Orders   UA/M w/rflx Culture, Routine      Repeat UA in 2 weeks  Follow up plan: Return if symptoms worsen or fail to improve.

## 2017-10-19 LAB — URINE CULTURE

## 2017-11-13 ENCOUNTER — Ambulatory Visit: Payer: Medicare Other | Admitting: Family Medicine

## 2017-12-19 ENCOUNTER — Telehealth: Payer: Self-pay | Admitting: Unknown Physician Specialty

## 2017-12-19 NOTE — Telephone Encounter (Signed)
I spoke with the patient, and she agreed to me sending this information via email.   1) [facility]      [address], [place], [place] [address]       [phone]       [email or web address]   [email or web address] 2) [facility], [place] [address], [place], [place] [address], [phone]

## 2018-01-08 ENCOUNTER — Encounter: Payer: Self-pay | Admitting: Nurse Practitioner

## 2018-01-08 ENCOUNTER — Ambulatory Visit (INDEPENDENT_AMBULATORY_CARE_PROVIDER_SITE_OTHER): Payer: Medicare Other | Admitting: Nurse Practitioner

## 2018-01-08 VITALS — BP 123/78 | HR 61 | Temp 98.3°F | Ht 62.0 in | Wt 141.8 lb

## 2018-01-08 DIAGNOSIS — E7800 Pure hypercholesterolemia, unspecified: Secondary | ICD-10-CM | POA: Diagnosis not present

## 2018-01-08 DIAGNOSIS — I10 Essential (primary) hypertension: Secondary | ICD-10-CM

## 2018-01-08 DIAGNOSIS — Z23 Encounter for immunization: Secondary | ICD-10-CM

## 2018-01-08 DIAGNOSIS — F332 Major depressive disorder, recurrent severe without psychotic features: Secondary | ICD-10-CM

## 2018-01-08 NOTE — Progress Notes (Signed)
 BP 123/78   Pulse 61   Temp 98.3 F (36.8 C) (Oral)   Ht 5' 2" (1.575 m)   Wt 141 lb 12.8 oz (64.3 kg)   LMP  (LMP Unknown)   SpO2 97%   BMI 25.94 kg/m    Subjective:    Patient ID: Tabitha Larson, female    DOB: 04/10/1950, 67 y.o.   MRN: [id number]  HPI: Tabitha Larson is a 67 y.o. female presents for 6 month follow-up  Chief Complaint  Patient presents with  . Depression  . Hyperlipidemia  . Hypertension   HYPERTENSION / HYPERLIPIDEMIA Fasting today. Satisfied with current treatment? yes Duration of hypertension: chronic BP monitoring frequency: not checking BP range: does not check BP medication side effects: no Past BP meds: does not recall Duration of hyperlipidemia: chronic Cholesterol medication side effects: no Cholesterol supplements: none Past cholesterol medications: none Medication compliance: good compliance Aspirin: no Recent stressors: no Recurrent headaches: no Visual changes: no Palpitations: no Dyspnea: no Chest pain: no Lower extremity edema: no Dizzy/lightheaded: no   DEPRESSION Mood status: better , reports Sunday is her "tough" day Satisfied with current treatment?: yes Symptom severity: mild  Duration of current treatment : chronic Side effects: no Medication compliance: good compliance Psychotherapy/counseling: yes in the past Previous psychiatric medications: none Depressed mood: no Anxious mood: no Anhedonia: no Significant weight loss or gain: no Insomnia: yes only once and awhile has hard time sleeping Fatigue: no Feelings of worthlessness or guilt: no Impaired concentration/indecisiveness: no Suicidal ideations: no Hopelessness: no Crying spells: no Depression screen PHQ 2/9 01/08/2018 10/08/2017 05/08/2017 04/09/2017 12/31/2016  Decreased Interest 1 1 1 1 1  Down, Depressed, Hopeless 1 1 1 1 1  PHQ - 2 Score 2 2 2 2 2  Altered sleeping 1 2 1 1 1  Tired, decreased energy 1 2 1 1 1  Change in appetite 3 3 3 3 3    Feeling bad or failure about yourself  1 1 2 1 2  Trouble concentrating 1 1 2 1 2  Moving slowly or fidgety/restless 1 0 0 0 2  Suicidal thoughts 0 0 0 0 1  PHQ-9 Score 10 11 11 9 14  Difficult doing work/chores - - Somewhat difficult - -    Relevant past medical, surgical, family and social history reviewed and updated as indicated. Interim medical history since our last visit reviewed. Allergies and medications reviewed and updated.  Review of Systems  Constitutional: Negative for activity change, appetite change, diaphoresis, fatigue and fever.  Respiratory: Negative for cough, chest tightness, shortness of breath and wheezing.   Cardiovascular: Negative for chest pain, palpitations and leg swelling.  Gastrointestinal: Negative for abdominal distention, abdominal pain, constipation, diarrhea, nausea and vomiting.  Endocrine: Negative for cold intolerance, heat intolerance, polydipsia, polyphagia and polyuria.  Musculoskeletal: Negative.   Skin: Negative.   Allergic/Immunologic: Negative.   Neurological: Negative for dizziness, numbness and headaches.  Hematological: Negative.   Psychiatric/Behavioral: Negative for behavioral problems, confusion, decreased concentration, sleep disturbance and suicidal ideas. The patient is not nervous/anxious.     Per HPI unless specifically indicated above     Objective:    BP 123/78   Pulse 61   Temp 98.3 F (36.8 C) (Oral)   Ht 5' 2" (1.575 m)   Wt 141 lb 12.8 oz (64.3 kg)   LMP  (LMP Unknown)   SpO2 97%   BMI 25.94 kg/m   Wt Readings from Last 3 Encounters:  01/08/18   141 lb 12.8 oz (64.3 kg)  10/17/17 138 lb 6 oz (62.8 kg)  10/08/17 139 lb 9.6 oz (63.3 kg)    Physical Exam  Constitutional: She is oriented to person, place, and time. She appears well-developed and well-nourished.  HENT:  Head: Normocephalic and atraumatic.  Right Ear: Hearing normal.  Left Ear: Hearing normal.  Nose: Right sinus exhibits no maxillary sinus  tenderness and no frontal sinus tenderness. Left sinus exhibits no maxillary sinus tenderness and no frontal sinus tenderness.  Eyes: Pupils are equal, round, and reactive to light. Conjunctivae and EOM are normal. Right eye exhibits no discharge. Left eye exhibits no discharge.  Neck: Normal range of motion. Neck supple. No JVD present. Carotid bruit is not present. No thyromegaly present.  Cardiovascular: Normal rate, regular rhythm, normal heart sounds and intact distal pulses.  Pulmonary/Chest: Effort normal and breath sounds normal.  Abdominal: Soft. Bowel sounds are normal. There is no splenomegaly or hepatomegaly.  Musculoskeletal: Normal range of motion.  Lymphadenopathy:    She has no cervical adenopathy.  Neurological: She is alert and oriented to person, place, and time. She has normal reflexes.  Skin: Skin is warm and dry.  Psychiatric: She has a normal mood and affect. Her behavior is normal.    Results for orders placed or performed in visit on 10/17/17  Urine Culture  Result Value Ref Range   Urine Culture, Routine Final report (A)    Organism ID, Bacteria Escherichia coli (A)    Antimicrobial Susceptibility Comment   Microscopic Examination  Result Value Ref Range   WBC, UA >30 (A) 0 - 5 /hpf   RBC, UA 0-2 0 - 2 /hpf   Epithelial Cells (non renal) 0-10 0 - 10 /hpf   Bacteria, UA Many (A) None seen/Few  UA/M w/rflx Culture, Routine  Result Value Ref Range   Specific Gravity, UA 1.015 1.005 - 1.030   pH, UA 7.0 5.0 - 7.5   Color, UA Orange Yellow   Appearance Ur Turbid (A) Clear   Leukocytes, UA 3+ (A) Negative   Protein, UA 1+ (A) Negative/Trace   Glucose, UA Negative Negative   Ketones, UA Negative Negative   RBC, UA Trace (A) Negative   Bilirubin, UA Negative Negative   Urobilinogen, Ur 1.0 0.2 - 1.0 mg/dL   Nitrite, UA Positive (A) Negative   Microscopic Examination See below:       Assessment & Plan:   Problem List Items Addressed This Visit       Cardiovascular and Mediastinum   Benign hypertension - Primary    Chronic, stable.  Continue current medication regimen.  CMP today.      Relevant Orders   Comp Met (CMET)     Other   Major depression    Chronic, ongoing.  [facility] has "been meaning to call them".  Continue Cymbalta.      Hypercholesterolemia    Chronic, stable.  Lipid panel today.  Continue statin.      Relevant Orders   Lipid Profile    Other Visit Diagnoses    Need for influenza vaccination       Relevant Orders   Flu vaccine HIGH DOSE PF (Completed)       Follow up plan: Return in about 6 months (around 07/09/2018) for HTN, HLD, depression.

## 2018-01-08 NOTE — Assessment & Plan Note (Addendum)
Chronic, stable.  Continue current medication regimen.  CMP today. 

## 2018-01-08 NOTE — Patient Instructions (Signed)

## 2018-01-08 NOTE — Assessment & Plan Note (Addendum)
Chronic, stable.  Lipid panel today.  Continue statin.

## 2018-01-08 NOTE — Assessment & Plan Note (Addendum)
Chronic, ongoing.  [facility] and reports she has "been meaning to call them".  Continue Cymbalta.

## 2018-01-09 LAB — COMPREHENSIVE METABOLIC PANEL
ALK PHOS: 83 IU/L (ref 39–117)
ALT: 20 IU/L (ref 0–32)
AST: 20 IU/L (ref 0–40)
Albumin/Globulin Ratio: 1.8 (ref 1.2–2.2)
Albumin: 4.7 g/dL (ref 3.6–4.8)
BILIRUBIN TOTAL: 0.5 mg/dL (ref 0.0–1.2)
BUN/Creatinine Ratio: 20 (ref 12–28)
BUN: 13 mg/dL (ref 8–27)
CHLORIDE: 100 mmol/L (ref 96–106)
CO2: 25 mmol/L (ref 20–29)
Calcium: 9.6 mg/dL (ref 8.7–10.3)
Creatinine, Ser: 0.66 mg/dL (ref 0.57–1.00)
GFR calc Af Amer: 104 mL/min/{1.73_m2} (ref >59)
GFR calc non Af Amer: 90 mL/min/{1.73_m2} (ref >59)
GLOBULIN, TOTAL: 2.6 g/dL (ref 1.5–4.5)
GLUCOSE: 91 mg/dL (ref 65–99)
Potassium: 3.6 mmol/L (ref 3.5–5.2)
SODIUM: 141 mmol/L (ref 134–144)
Total Protein: 7.3 g/dL (ref 6.0–8.5)

## 2018-01-09 LAB — LIPID PANEL
CHOLESTEROL TOTAL: 182 mg/dL (ref 100–199)
Chol/HDL Ratio: 4 ratio (ref 0.0–4.4)
HDL: 46 mg/dL (ref >39)
LDL CALC: 117 mg/dL — AB (ref 0–99)
Triglycerides: 93 mg/dL (ref 0–149)
VLDL CHOLESTEROL CAL: 19 mg/dL (ref 5–40)

## 2018-03-11 ENCOUNTER — Other Ambulatory Visit: Payer: Self-pay | Admitting: Unknown Physician Specialty

## 2018-03-11 NOTE — Telephone Encounter (Signed)
Requested medication (s) are due for refill today -yes  Requested medication (s) are on the active medication list -yes  Future visit scheduled -yes  Last refill: 04/09/17  Notes to clinic: Patient is requesting a refill that passes protocol. She is seeing J.Cannady now- can the prescriber please be updated.   Requested Prescriptions  Pending Prescriptions Disp Refills   DULoxetine (CYMBALTA) 60 MG capsule [Pharmacy Med Name: DULoxetine HCl 60 MG Oral Capsule Delayed Release Particles] 90 capsule 0    Sig: TAKE 1 CAPSULE BY MOUTH ONCE DAILY     Psychiatry: Antidepressants - SNRI Passed - 03/11/2018 10:01 AM      Passed - Completed PHQ-2 or PHQ-9 in the last 360 days.      Passed - Last BP in normal range    BP Readings from Last 1 Encounters:  01/08/18 123/78         Passed - Valid encounter within last 6 months    Recent Outpatient Visits          2 months ago Benign hypertension   [place], Weinert T, NP   4 months ago [place] Kathrine Haddock, NP   5 months ago Severe episode of recurrent major depressive disorder, without psychotic features [facility])   [facility] Terrilee Croak, Adriana M, PA-C   11 months ago Fatigue, unspecified type   [facility] Kathrine Haddock, NP   1 year ago Severe episode of recurrent major depressive disorder, without psychotic features ([place])   [place] Kathrine Haddock, NP      Future Appointments            In 2 months  [place], [facility]   In 4 months Cannady, Barbaraann Faster, NP [facility], [facility]            Requested Prescriptions  Pending Prescriptions Disp Refills   DULoxetine (CYMBALTA) 60 MG capsule [Pharmacy Med Name: DULoxetine HCl 60 MG Oral Capsule Delayed Release Particles] 90 capsule 0    Sig: TAKE 1 CAPSULE BY MOUTH ONCE DAILY     Psychiatry: Antidepressants - SNRI Passed - 03/11/2018 10:01 AM      Passed - Completed PHQ-2 or PHQ-9  in the last 360 days.      Passed - Last BP in normal range    BP Readings from Last 1 Encounters:  01/08/18 123/78         Passed - Valid encounter within last 6 months    Recent Outpatient Visits          2 months ago Benign hypertension   [place], [place] T, NP   4 months ago [place] Kathrine Haddock, NP   5 months ago Severe episode of recurrent major depressive disorder, without psychotic features [facility])   [facility] Terrilee Croak, Adriana M, PA-C   11 months ago Fatigue, unspecified type   [facility] Kathrine Haddock, NP   1 year ago Severe episode of recurrent major depressive disorder, without psychotic features ([place])   [place] Kathrine Haddock, NP      Future Appointments            In 2 months  [place], [place]   In 4 months Cannady, Barbaraann Faster, NP [facility], [facility]

## 2018-04-17 ENCOUNTER — Other Ambulatory Visit: Payer: Self-pay | Admitting: Unknown Physician Specialty

## 2018-04-17 NOTE — Telephone Encounter (Signed)
Requested Prescriptions  Pending Prescriptions Disp Refills  . amLODipine (NORVASC) 5 MG tablet [Pharmacy Med Name: amLODIPine Besylate 5 MG Oral Tablet] 90 tablet 0    Sig: TAKE 1 TABLET BY MOUTH ONCE DAILY     Cardiovascular:  Calcium Channel Blockers Passed - 04/17/2018 10:37 AM      Passed - Last BP in normal range    BP Readings from Last 1 Encounters:  01/08/18 123/78         Passed - Valid encounter within last 6 months    Recent Outpatient Visits          3 months ago Benign hypertension   [place], Henrine Screws T, NP   6 months ago [place] Kathrine Haddock, NP   6 months ago Severe episode of recurrent major depressive disorder, without psychotic features ([place])   [place] Trinna Post, PA-C   1 year ago Fatigue, unspecified type   [facility] Kathrine Haddock, NP   1 year ago Severe episode of recurrent major depressive disorder, without psychotic features ([place])   [place] Kathrine Haddock, NP      Future Appointments            In 3 weeks  [place], [place]   In 2 months Cannady, Barbaraann Faster, NP [facility], [facility]         . atorvastatin (LIPITOR) 40 MG tablet [Pharmacy Med Name: Atorvastatin Calcium 40 MG Oral Tablet] 90 tablet 0    Sig: TAKE 1 TABLET BY MOUTH ONCE DAILY     Cardiovascular:  Antilipid - Statins Failed - 04/17/2018 10:37 AM      Failed - LDL in normal range and within 360 days    LDL Calculated  Date Value Ref Range Status  01/08/2018 117 (H) 0 - 99 mg/dL Final         Passed - Total Cholesterol in normal range and within 360 days    Cholesterol, Total  Date Value Ref Range Status  01/08/2018 182 100 - 199 mg/dL Final   Cholesterol Piccolo, Waived  Date Value Ref Range Status  01/24/2015 185 <200 mg/dL Final    Comment:                            Desirable                <200                         Borderline High      200- 239                         High                     >239          Passed - HDL in normal range and within 360 days    HDL  Date Value Ref Range Status  01/08/2018 46 >39 mg/dL Final         Passed - Triglycerides in normal range and within 360 days    Triglycerides  Date Value Ref Range Status  01/08/2018 93 0 - 149 mg/dL Final   Triglycerides Piccolo,Waived  Date Value Ref Range Status  01/24/2015 133 <150 mg/dL Final    Comment:  Normal                   <150                         Borderline High     150 - 199                         High                200 - 499                         Very High                >499          Passed - Patient is not pregnant      Passed - Valid encounter within last 12 months    Recent Outpatient Visits          3 months ago Benign hypertension   [place], [place] T, NP   6 months ago [place] Kathrine Haddock, NP   6 months ago Severe episode of recurrent major depressive disorder, without psychotic features ([place])   [place] Trinna Post, PA-C   1 year ago Fatigue, unspecified type   [facility] Kathrine Haddock, NP   1 year ago Severe episode of recurrent major depressive disorder, without psychotic features ([place])   [place] Kathrine Haddock, NP      Future Appointments            In 3 weeks  [place], [facility]   In 2 months Cannady, Barbaraann Faster, NP [facility], [facility]         . hydrochlorothiazide (HYDRODIURIL) 25 MG tablet [Pharmacy Med Name: hydroCHLOROthiazide 25 MG Oral Tablet] 90 tablet 0    Sig: TAKE 1 TABLET BY MOUTH ONCE DAILY     Cardiovascular: Diuretics - Thiazide Passed - 04/17/2018 10:37 AM      Passed - Ca in normal range and within 360 days    Calcium  Date Value Ref Range Status  01/08/2018 9.6 8.7 - 10.3 mg/dL Final         Passed - Cr in normal range and within 360  days    Creatinine, Ser  Date Value Ref Range Status  01/08/2018 0.66 0.57 - 1.00 mg/dL Final         Passed - K in normal range and within 360 days    Potassium  Date Value Ref Range Status  01/08/2018 3.6 3.5 - 5.2 mmol/L Final  12/15/2013 3.6 3.5 - 5.1 mmol/L Final         Passed - Na in normal range and within 360 days    Sodium  Date Value Ref Range Status  01/08/2018 141 134 - 144 mmol/L Final         Passed - Last BP in normal range    BP Readings from Last 1 Encounters:  01/08/18 123/78         Passed - Valid encounter within last 6 months    Recent Outpatient Visits          3 months ago Benign hypertension   [place], Henrine Screws T, NP   6 months ago [place] Kathrine Haddock, NP  6 months ago Severe episode of recurrent major depressive disorder, without psychotic features [facility])   [place] Trinna Post, PA-C   1 year ago Fatigue, unspecified type   [facility] Kathrine Haddock, NP   1 year ago Severe episode of recurrent major depressive disorder, without psychotic features ([place])   [place] Kathrine Haddock, NP      Future Appointments            In 3 weeks  [place], [place]   In 2 months Cannady, Barbaraann Faster, NP [facility], [facility]

## 2018-04-28 ENCOUNTER — Telehealth: Payer: Self-pay | Admitting: Nurse Practitioner

## 2018-04-28 NOTE — Telephone Encounter (Signed)
Copied from [place] [phone]. Topic: Medicare AWV >> Apr 28, 2018  4:21 PM Sherren Kerns wrote: Called to RESCHEDULE her  Medicare Annual Wellness Visit with the Nurse Health Advisor. No answer, left message to call Lattie Haw at [phone].  Patient's original appointment is 05/11/2018 at 8:00 AM, and we need to reschedule do to a change in the Nurse Health Advisor's schedule.  If patient returns call, please note: their last AWV was on 05/08/2017,please schedule AWV-s with [facility] any date AFTER 05/09/2018. Thank you! For any questions please contact: Janace Hoard at [phone] or Skype [email or web address] Spoke with patient 05/15/2018 at 4:58 PM and reminded her of her AWV appointment on 05/18/2018 at 11:00 am and to give her the change in location of the visit and to register at the building behind [facility].  Patient voiced understood.  Janace Hoard, Care Guide.

## 2018-05-11 ENCOUNTER — Ambulatory Visit: Payer: Self-pay

## 2018-05-18 ENCOUNTER — Ambulatory Visit (INDEPENDENT_AMBULATORY_CARE_PROVIDER_SITE_OTHER): Payer: Medicare Other

## 2018-05-18 VITALS — BP 104/70 | HR 63 | Temp 98.3°F | Resp 16 | Ht 62.5 in | Wt 140.1 lb

## 2018-05-18 DIAGNOSIS — Z0000 Encounter for general adult medical examination without abnormal findings: Secondary | ICD-10-CM

## 2018-05-18 NOTE — Patient Instructions (Addendum)
Tabitha Larson , Thank you for taking time to come for your Medicare Wellness Visit. I appreciate your ongoing commitment to your health goals. Please review the following plan we discussed and let me know if I can assist you in the future.   Screening recommendations/referrals: Colonoscopy: completed 07/28/2015 Mammogram: completed 05/27/2017 Bone Density: completed 05/27/2017  Recommended yearly ophthalmology/optometry visit for glaucoma screening and checkup Recommended yearly dental visit for hygiene and checkup  Vaccinations: Influenza vaccine: up to date Pneumococcal vaccine: up to date Tdap vaccine: up to date Shingles vaccine: shingrix eligible, check with your insurance company for coverage    Advanced directives: Advance directive discussed with you today. Even though you declined this today please call our office should you change your mind and we can give you the proper paperwork for you to fill out.  Conditions/risks identified: recommend drinking at least 6-8 glass of water a day.   Next appointment: Follow up on 07/09/2018 at 10:30am with Jolene Cannady,NP. Follow up in one year for your annual wellness exam.    Preventive Care 65 Years and Older, Female Preventive care refers to lifestyle choices and visits with your health care provider that can promote health and wellness. What does preventive care include?  A yearly physical exam. This is also called an annual well check.  Dental exams once or twice a year.  Routine eye exams. Ask your health care provider how often you should have your eyes checked.  Personal lifestyle choices, including:  Daily care of your teeth and gums.  Regular physical activity.  Eating a healthy diet.  Avoiding tobacco and drug use.  Limiting alcohol use.  Practicing safe sex.  Taking low-dose aspirin every day.  Taking vitamin and mineral supplements as recommended by your health care provider. What happens during an annual well  check? The services and screenings done by your health care provider during your annual well check will depend on your age, overall health, lifestyle risk factors, and family history of disease. Counseling  Your health care provider may ask you questions about your:  Alcohol use.  Tobacco use.  Drug use.  Emotional well-being.  Home and relationship well-being.  Sexual activity.  Eating habits.  History of falls.  Memory and ability to understand (cognition).  Work and work Statistician.  Reproductive health. Screening  You may have the following tests or measurements:  Height, weight, and BMI.  Blood pressure.  Lipid and cholesterol levels. These may be checked every 5 years, or more frequently if you are over 99 years old.  Skin check.  Lung cancer screening. You may have this screening every year starting at age 54 if you have a 30-pack-year history of smoking and currently smoke or have quit within the past 15 years.  Fecal occult blood test (FOBT) of the stool. You may have this test every year starting at age 24.  Flexible sigmoidoscopy or colonoscopy. You may have a sigmoidoscopy every 5 years or a colonoscopy every 10 years starting at age 59.  Hepatitis C blood test.  Hepatitis B blood test.  Sexually transmitted disease (STD) testing.  Diabetes screening. This is done by checking your blood sugar (glucose) after you have not eaten for a while (fasting). You may have this done every 1-3 years.  Bone density scan. This is done to screen for osteoporosis. You may have this done starting at age 29.  Mammogram. This may be done every 1-2 years. Talk to your health care provider about how often  you should have regular mammograms. Talk with your health care provider about your test results, treatment options, and if necessary, the need for more tests. Vaccines  Your health care provider may recommend certain vaccines, such as:  Influenza vaccine. This is  recommended every year.  Tetanus, diphtheria, and acellular pertussis (Tdap, Td) vaccine. You may need a Td booster every 10 years.  Zoster vaccine. You may need this after age 61.  Pneumococcal 13-valent conjugate (PCV13) vaccine. One dose is recommended after age 62.  Pneumococcal polysaccharide (PPSV23) vaccine. One dose is recommended after age 23. Talk to your health care provider about which screenings and vaccines you need and how often you need them. This information is not intended to replace advice given to you by your health care provider. Make sure you discuss any questions you have with your health care provider. Document Released: 03/17/2015 Document Revised: 11/08/2015 Document Reviewed: 12/20/2014 Elsevier Interactive Patient Education  2017 [place] Prevention in the Home Falls can cause injuries. They can happen to people of all ages. There are many things you can do to make your home safe and to help prevent falls. What can I do on the outside of my home?  Regularly fix the edges of walkways and driveways and fix any cracks.  Remove anything that might make you trip as you walk through a door, such as a raised step or threshold.  Trim any bushes or trees on the path to your home.  Use bright outdoor lighting.  Clear any walking paths of anything that might make someone trip, such as rocks or tools.  Regularly check to see if handrails are loose or broken. Make sure that both sides of any steps have handrails.  Any raised decks and porches should have guardrails on the edges.  Have any leaves, snow, or ice cleared regularly.  Use sand or salt on walking paths during winter.  Clean up any spills in your garage right away. This includes oil or grease spills. What can I do in the bathroom?  Use night lights.  Install grab bars by the toilet and in the tub and shower. Do not use towel bars as grab bars.  Use non-skid mats or decals in the tub or  shower.  If you need to sit down in the shower, use a plastic, non-slip stool.  Keep the floor dry. Clean up any water that spills on the floor as soon as it happens.  Remove soap buildup in the tub or shower regularly.  Attach bath mats securely with double-sided non-slip rug tape.  Do not have throw rugs and other things on the floor that can make you trip. What can I do in the bedroom?  Use night lights.  Make sure that you have a light by your bed that is easy to reach.  Do not use any sheets or blankets that are too big for your bed. They should not hang down onto the floor.  Have a firm chair that has side arms. You can use this for support while you get dressed.  Do not have throw rugs and other things on the floor that can make you trip. What can I do in the kitchen?  Clean up any spills right away.  Avoid walking on wet floors.  Keep items that you use a lot in easy-to-reach places.  If you need to reach something above you, use a strong step stool that has a grab bar.  Keep electrical cords  out of the way.  Do not use floor polish or wax that makes floors slippery. If you must use wax, use non-skid floor wax.  Do not have throw rugs and other things on the floor that can make you trip. What can I do with my stairs?  Do not leave any items on the stairs.  Make sure that there are handrails on both sides of the stairs and use them. Fix handrails that are broken or loose. Make sure that handrails are as long as the stairways.  Check any carpeting to make sure that it is firmly attached to the stairs. Fix any carpet that is loose or worn.  Avoid having throw rugs at the top or bottom of the stairs. If you do have throw rugs, attach them to the floor with carpet tape.  Make sure that you have a light switch at the top of the stairs and the bottom of the stairs. If you do not have them, ask someone to add them for you. What else can I do to help prevent falls?   Wear shoes that:  Do not have high heels.  Have rubber bottoms.  Are comfortable and fit you well.  Are closed at the toe. Do not wear sandals.  If you use a stepladder:  Make sure that it is fully opened. Do not climb a closed stepladder.  Make sure that both sides of the stepladder are locked into place.  Ask someone to hold it for you, if possible.  Clearly mark and make sure that you can see:  Any grab bars or handrails.  First and last steps.  Where the edge of each step is.  Use tools that help you move around (mobility aids) if they are needed. These include:  Canes.  Walkers.  Scooters.  Crutches.  Turn on the lights when you go into a dark area. Replace any light bulbs as soon as they burn out.  Set up your furniture so you have a clear path. Avoid moving your furniture around.  If any of your floors are uneven, fix them.  If there are any pets around you, be aware of where they are.  Review your medicines with your doctor. Some medicines can make you feel dizzy. This can increase your chance of falling. Ask your doctor what other things that you can do to help prevent falls. This information is not intended to replace advice given to you by your health care provider. Make sure you discuss any questions you have with your health care provider. Document Released: 12/15/2008 Document Revised: 07/27/2015 Document Reviewed: 03/25/2014 Elsevier Interactive Patient Education  2017 [employer].

## 2018-05-18 NOTE — Progress Notes (Signed)
Subjective:   Tabitha Larson is a 68 y.o. female who presents for Medicare Annual (Subsequent) preventive examination.  Review of Systems:   Cardiac Risk Factors include: advanced age (>109men, >75 women);hypertension;dyslipidemia     Objective:     Vitals: BP 104/70 (BP Location: Left Arm, Patient Position: Sitting, Cuff Size: Normal)   Pulse 63   Temp 98.3 F (36.8 C) (Temporal)   Resp 16   Ht 5' 2.5" (1.588 m)   Wt 140 lb 1.6 oz (63.5 kg)   LMP  (LMP Unknown)   BMI 25.22 kg/m   Body mass index is 25.22 kg/m.  Advanced Directives 05/18/2018 05/08/2017 05/06/2016 03/26/2016 07/28/2015 05/01/2015 05/01/2015  Does Patient Have a Medical Advance Directive? - No No No No No No  Would patient like information on creating a medical advance directive? No - Patient declined Yes (MAU/Ambulatory/Procedural Areas - Information given) (No Data) Yes (MAU/Ambulatory/Procedural Areas - Information given) - No - patient declined information -    Tobacco Social History   Tobacco Use  Smoking Status Former Smoker  . Types: Cigarettes  . Last attempt to quit: 01/05/1984  . Years since quitting: 34.3  Smokeless Tobacco Never Used     Counseling given: Not Answered   Clinical Intake:  Pre-visit preparation completed: Yes  Pain : No/denies pain     Nutritional Status: BMI 25 -29 Overweight Nutritional Risks: None Diabetes: No  How often do you need to have someone help you when you read instructions, pamphlets, or other written materials from your doctor or pharmacy?: 1 - Never What is the last grade level you completed in school?: high school   Interpreter Needed?: No  Information entered by :: Tiffany Hill,LPN   Past Medical History:  Diagnosis Date  . Ankle fracture   . Anxiety   . Depression   . GERD (gastroesophageal reflux disease)   . Hyperlipidemia   . Hypertension   . Migraine   . Osteoporosis   . PONV (postoperative nausea and vomiting)    after ankle repair    . Wears dentures    full upper   Past Surgical History:  Procedure Laterality Date  . ANKLE FRACTURE SURGERY    . CATARACT EXTRACTION Right 01/2014  . CATARACT EXTRACTION W/PHACO Left 03/26/2016   Procedure: CATARACT EXTRACTION PHACO AND INTRAOCULAR LENS PLACEMENT ([place])  left;  Surgeon: Eulogio Bear, MD;  Location: [facility];  Service: Ophthalmology;  Laterality: Left;  . COLONOSCOPY  02/04/2014  . COLONOSCOPY WITH PROPOFOL N/A 07/28/2015   Procedure: COLONOSCOPY WITH PROPOFOL;  Surgeon: Lucilla Lame, MD;  Location: [facility];  Service: Endoscopy;  Laterality: N/A;  No anesthesia  . MANDIBLE SURGERY    . POLYPECTOMY  07/28/2015   Procedure: POLYPECTOMY;  Surgeon: Lucilla Lame, MD;  Location: [facility];  Service: Endoscopy;;   Family History  Problem Relation Age of Onset  . Stroke Mother   . Prostate cancer Father   . Colon cancer Sister   . Hypertension Sister   . Hyperlipidemia Sister   . Breast cancer Neg Hx    Social History   Socioeconomic History  . Marital status: Single    Spouse name: Not on file  . Number of children: Not on file  . Years of education: Not on file  . Highest education level: High school graduate  Occupational History  . Occupation: retired  Scientific laboratory technician  . Financial resource strain: Not hard at all  . Food insecurity:  Worry: Never true    Inability: Never true  . Transportation needs:    Medical: No    Non-medical: No  Tobacco Use  . Smoking status: Former Smoker    Types: Cigarettes    Last attempt to quit: 01/05/1984    Years since quitting: 34.3  . Smokeless tobacco: Never Used  Substance and Sexual Activity  . Alcohol use: No    Alcohol/week: 0.0 standard drinks  . Drug use: No  . Sexual activity: Not Currently    Birth control/protection: Post-menopausal  Lifestyle  . Physical activity:    Days per week: 0 days    Minutes per session: 0 min  . Stress: Not at all  Relationships  . Social  connections:    Talks on phone: Never    Gets together: Twice a week    Attends religious service: More than 4 times per year    Active member of club or organization: No    Attends meetings of clubs or organizations: Never    Relationship status: Divorced  Other Topics Concern  . Not on file  Social History Narrative   Niece lives in area   No kids   Has friends close by    Outpatient Encounter Medications as of 05/18/2018  Medication Sig  . amLODipine (NORVASC) 5 MG tablet TAKE 1 TABLET BY MOUTH ONCE DAILY  . atorvastatin (LIPITOR) 40 MG tablet TAKE 1 TABLET BY MOUTH ONCE DAILY  . DULoxetine (CYMBALTA) 60 MG capsule TAKE 1 CAPSULE BY MOUTH ONCE DAILY  . hydrochlorothiazide (HYDRODIURIL) 25 MG tablet TAKE 1 TABLET BY MOUTH ONCE DAILY   No facility-administered encounter medications on file as of 05/18/2018.     Activities of Daily Living In your present state of health, do you have any difficulty performing the following activities: 05/18/2018  Hearing? N  Vision? N  Difficulty concentrating or making decisions? N  Walking or climbing stairs? N  Dressing or bathing? N  Doing errands, shopping? N  Preparing Food and eating ? N  Using the Toilet? N  In the past six months, have you accidently leaked urine? N  Do you have problems with loss of bowel control? N  Managing your Medications? N  Managing your Finances? N  Housekeeping or managing your Housekeeping? N  Some recent data might be hidden    Patient Care Team: Venita Lick, NP as PCP - General (Nurse Practitioner) Eulogio Bear, MD as Consulting Physician (Ophthalmology)    Assessment:   This is a routine wellness examination for Tabitha Larson.  Exercise Activities and Dietary recommendations Current Exercise Habits: The patient does not participate in regular exercise at present, Exercise limited by: None identified  Goals    . DIET - INCREASE WATER INTAKE     Recommend drinking at least 6-8 glasses of  water        Fall Risk: Fall Risk  05/18/2018 01/08/2018 05/08/2017 11/11/2016 05/06/2016  Falls in the past year? 0 0 No No Yes  Number falls in past yr: - - - - 2 or more  Injury with Fall? - - - - No    FALL RISK PREVENTION PERTAINING TO THE HOME:  Any stairs in or around the home? Yes  If so, are there any without handrails? No   Home free of loose throw rugs in walkways, pet beds, electrical cords, etc? Yes  Adequate lighting in your home to reduce risk of falls? Yes   ASSISTIVE DEVICES UTILIZED TO PREVENT FALLS:  Life alert? No  Use of a cane, walker or w/c? No  Grab bars in the bathroom? No  Shower chair or bench in shower? No  Elevated toilet seat or a handicapped toilet? No   DME ORDERS:  DME order needed?  No   TIMED UP AND GO:  Was the test performed? No .  Length of time to ambulate 10 feet: 11 sec.   GAIT:  Appearance of gait: Gait steady and fast without the use of an assistive device.  Education: Fall risk prevention has been discussed.  Intervention(s) required? No   DME/home health order needed?  No    Depression Screen PHQ 2/9 Scores 05/18/2018 01/08/2018 10/08/2017 05/08/2017  PHQ - 2 Score 0 2 2 2   PHQ- 9 Score - 10 11 11      Cognitive Function     6CIT Screen 05/18/2018 05/08/2017  What Year? 0 points 0 points  What month? 0 points 0 points  What time? 0 points 0 points  Count back from 20 0 points 0 points  Months in reverse 0 points 0 points  Repeat phrase 0 points 0 points  Total Score 0 0    Immunization History  Administered Date(s) Administered  . DTaP 08/27/2010  . Influenza, High Dose Seasonal PF 11/11/2016, 01/08/2018  . Influenza,inj,Quad PF,6+ Mos 01/24/2015  . Influenza-Unspecified 02/11/2014, 12/12/2015  . Pneumococcal Conjugate-13 02/11/2014  . Pneumococcal Polysaccharide-23 05/01/2015  . Tdap 08/27/2010  . Zoster 09/11/2011    Qualifies for Shingles Vaccine? Yes  Zostavax completed 09/11/2011. Due for Shingrix. Education  has been provided regarding the importance of this vaccine. Pt has been advised to call insurance company to determine out of pocket expense. Advised may also receive vaccine at local pharmacy or Health Dept. Verbalized acceptance and understanding.  Tdap: up to date   Flu Vaccine: up to date   Pneumococcal Vaccine: up to date   Screening Tests Health Maintenance  Topic Date Due  . MAMMOGRAM  05/28/2019  . COLONOSCOPY  07/27/2020  . TETANUS/TDAP  08/26/2020  . INFLUENZA VACCINE  Completed  . DEXA SCAN  Completed  . Hepatitis C Screening  Completed  . PNA vac Low Risk Adult  Completed    Cancer Screenings:  Colorectal Screening: Completed 07/28/2015. Repeat every 5 years  Mammogram: completed 05/27/2017  Bone Density: Completed 05/27/2017  Lung Cancer Screening: (Low Dose CT Chest recommended if Age 6-80 years, 30 pack-year currently smoking OR have quit w/in 15years.) does not qualify.     Additional Screening:  Hepatitis C Screening: does qualify; Completed 05/01/2015  Vision Screening: Recommended annual ophthalmology exams for early detection of glaucoma and other disorders of the eye. Is the patient up to date with their annual eye exam?  Yes  Who is the provider or what is the name of the office in which the pt attends annual eye exams? Dr.King    Dental Screening: Recommended annual dental exams for proper oral hygiene  Community Resource Referral:  CRR required this visit?  No       Plan:  I have personally reviewed and addressed the Medicare Annual Wellness questionnaire and have noted the following in the patient's chart:  A. Medical and social history B. Use of alcohol, tobacco or illicit drugs  C. Current medications and supplements D. Functional ability and status E.  Nutritional status F.  Physical activity G. Advance directives H. List of other physicians I.  Hospitalizations, surgeries, and ER visits in previous 12 months J.  Vitals  K.  Screenings such as hearing and vision if needed, cognitive and depression L. Referrals and appointments   In addition, I have reviewed and discussed with patient certain preventive protocols, quality metrics, and best practice recommendations. A written personalized care plan for preventive services as well as general preventive health recommendations were provided to patient. Nurse Health Advisor  Signed,    [place], Caro Hight, [place]  3/96/8864 Nurse Health Advisor   Nurse Notes: none

## 2018-06-16 ENCOUNTER — Other Ambulatory Visit: Payer: Self-pay | Admitting: Nurse Practitioner

## 2018-06-16 NOTE — Telephone Encounter (Signed)
Requested Prescriptions  Pending Prescriptions Disp Refills  . DULoxetine (CYMBALTA) 60 MG capsule [Pharmacy Med Name: DULoxetine HCl 60 MG Oral Capsule Delayed Release Particles] 90 capsule 0    Sig: Take 1 capsule by mouth once daily     Psychiatry: Antidepressants - SNRI Passed - 06/16/2018  9:54 AM      Passed - Completed PHQ-2 or PHQ-9 in the last 360 days.      Passed - Last BP in normal range    BP Readings from Last 1 Encounters:  05/18/18 104/70         Passed - Valid encounter within last 6 months    Recent Outpatient Visits          5 months ago Benign hypertension   [place], Lytle T, NP   8 months ago [place] Kathrine Haddock, NP   8 months ago Severe episode of recurrent major depressive disorder, without psychotic features [facility])   [place] Trinna Post, PA-C   1 year ago Fatigue, unspecified type   [facility] Kathrine Haddock, NP   1 year ago Severe episode of recurrent major depressive disorder, without psychotic features ([place])   [place] Kathrine Haddock, NP      Future Appointments            In 3 weeks Cannady, Barbaraann Faster, NP [facility]

## 2018-07-09 ENCOUNTER — Encounter: Payer: Self-pay | Admitting: Nurse Practitioner

## 2018-07-09 ENCOUNTER — Other Ambulatory Visit: Payer: Self-pay

## 2018-07-09 ENCOUNTER — Ambulatory Visit (INDEPENDENT_AMBULATORY_CARE_PROVIDER_SITE_OTHER): Payer: Medicare Other | Admitting: Nurse Practitioner

## 2018-07-09 VITALS — Temp 98.5°F

## 2018-07-09 DIAGNOSIS — F332 Major depressive disorder, recurrent severe without psychotic features: Secondary | ICD-10-CM | POA: Diagnosis not present

## 2018-07-09 DIAGNOSIS — I10 Essential (primary) hypertension: Secondary | ICD-10-CM

## 2018-07-09 DIAGNOSIS — E7800 Pure hypercholesterolemia, unspecified: Secondary | ICD-10-CM

## 2018-07-09 NOTE — Assessment & Plan Note (Signed)
Chronic, ongoing.  Continue current medication regimen.  Plan on 3 mos f/u to assess BP in office.  Recommend obtaining cuff for home and checking.

## 2018-07-09 NOTE — Progress Notes (Signed)
Temp 98.5 F (36.9 C) (Oral)   LMP  (LMP Unknown)    Subjective:    Patient ID: Tabitha Larson, female    DOB: 1950-09-24, 68 y.o.   MRN: [id number]  HPI: Tabitha Larson is a 68 y.o. female  Chief Complaint  Patient presents with  . Depression  . Hyperlipidemia  . Hypertension    . This visit was completed via telephone due to the restrictions of the COVID-19 pandemic. All issues as above were discussed and addressed but no physical exam was performed. If it was felt that the patient should be evaluated in the office, they were directed there. The patient verbally consented to this visit. Patient was unable to complete an audio/visual visit due to Lack of equipment. Due to the catastrophic nature of the COVID-19 pandemic, this visit was done through audio contact only. . Location of the patient: home . Location of the provider: home . Those involved with this call:  . Provider: Marnee Guarneri, DNP . CMA: Yvonna Alanis, CMA . Front Desk/Registration: Jill Side  . Time spent on call: 15 minutes on the phone discussing health concerns. 10 minutes total spent in review of patient's record and preparation of their chart. I verified patient identity using two factors (patient name and date of birth). Patient consents verbally to being seen via telemedicine visit today.   HYPERTENSION / HYPERLIPIDEMIA Continues on HCTZ, Amlodipine, and Lipitor. Satisfied with current treatment? yes Duration of hypertension: chronic BP monitoring frequency: not checking BP range:  BP medication side effects: no Duration of hyperlipidemia: chronic Cholesterol medication side effects: no Cholesterol supplements: none Medication compliance: good compliance Aspirin: no Recent stressors: no Recurrent headaches: no Visual changes: no Palpitations: no Dyspnea: no Chest pain: no Lower extremity edema: no Dizzy/lightheaded: no   DEPRESSION Continues on Cymbalta at 60 MG.  She endorses  that Sunday is her toughest day, which she feels is related to it being a family day and she never had children which is one of her regrets. Mood status: stable Satisfied with current treatment?: yes Symptom severity: mild  Duration of current treatment : chronic Side effects: no Medication compliance: good compliance Psychotherapy/counseling: has not been, but has been wanting to go Engineer, manufacturing systems Previous psychiatric medications: Duloxetine Depressed mood: yes Anxious mood: no Anhedonia: no Significant weight loss or gain: no Insomnia: none Fatigue: no Feelings of worthlessness or guilt: no Impaired concentration/indecisiveness: no Suicidal ideations: no Hopelessness: no Crying spells: sometimes Depression screen [facility] 2/9 07/09/2018 05/18/2018 01/08/2018 10/08/2017 05/08/2017  Decreased Interest 1 0 '1 1 1  ' Down, Depressed, Hopeless 2 0 '1 1 1  ' PHQ - 2 Score 3 0 '2 2 2  ' Altered sleeping 2 - '1 2 1  ' Tired, decreased energy 3 - '1 2 1  ' Change in appetite 3 - '3 3 3  ' Feeling bad or failure about yourself  2 - '1 1 2  ' Trouble concentrating 2 - '1 1 2  ' Moving slowly or fidgety/restless 0 - 1 0 0  Suicidal thoughts 0 - 0 0 0  PHQ-9 Score 15 - '10 11 11  ' Difficult doing work/chores Somewhat difficult - - - Somewhat difficult  Some recent data might be hidden    Relevant past medical, surgical, family and social history reviewed and updated as indicated. Interim medical history since our last visit reviewed. Allergies and medications reviewed and updated.  Review of Systems  Constitutional: Negative for activity change, appetite change, diaphoresis, fatigue and fever.  Respiratory: Negative  for cough, chest tightness and shortness of breath.   Cardiovascular: Negative for chest pain, palpitations and leg swelling.  Gastrointestinal: Negative for abdominal distention, abdominal pain, constipation, diarrhea, nausea and vomiting.  Neurological: Negative for dizziness, syncope, weakness,  light-headedness, numbness and headaches.  Psychiatric/Behavioral: Negative.     Per HPI unless specifically indicated above     Objective:    Temp 98.5 F (36.9 C) (Oral)   LMP  (LMP Unknown)   Wt Readings from Last 3 Encounters:  05/18/18 140 lb 1.6 oz (63.5 kg)  01/08/18 141 lb 12.8 oz (64.3 kg)  10/17/17 138 lb 6 oz (62.8 kg)    Physical Exam   Unable to perform visual exam due to telephone only visit.  Results for orders placed or performed in visit on 01/08/18  Lipid Profile  Result Value Ref Range   Cholesterol, Total 182 100 - 199 mg/dL   Triglycerides 93 0 - 149 mg/dL   HDL 46 >39 mg/dL   VLDL Cholesterol Cal 19 5 - 40 mg/dL   LDL Calculated 117 (H) 0 - 99 mg/dL   Chol/HDL Ratio 4.0 0.0 - 4.4 ratio  Comp Met (CMET)  Result Value Ref Range   Glucose 91 65 - 99 mg/dL   BUN 13 8 - 27 mg/dL   Creatinine, Ser 0.66 0.57 - 1.00 mg/dL   GFR calc non Af Amer 90 >59 mL/min/1.73   GFR calc Af Amer 104 >59 mL/min/1.73   BUN/Creatinine Ratio 20 12 - 28   Sodium 141 134 - 144 mmol/L   Potassium 3.6 3.5 - 5.2 mmol/L   Chloride 100 96 - 106 mmol/L   CO2 25 20 - 29 mmol/L   Calcium 9.6 8.7 - 10.3 mg/dL   Total Protein 7.3 6.0 - 8.5 g/dL   Albumin 4.7 3.6 - 4.8 g/dL   Globulin, Total 2.6 1.5 - 4.5 g/dL   Albumin/Globulin Ratio 1.8 1.2 - 2.2   Bilirubin Total 0.5 0.0 - 1.2 mg/dL   Alkaline Phosphatase 83 39 - 117 IU/L   AST 20 0 - 40 IU/L   ALT 20 0 - 32 IU/L      Assessment & Plan:   Problem List Items Addressed This Visit      Cardiovascular and Mediastinum   Benign hypertension    Chronic, ongoing.  Continue current medication regimen.  Plan on 3 mos f/u to assess BP in office.  Recommend obtaining cuff for home and checking.        Other   Major depression - Primary    Chronic, ongoing.  Continue current medication regimen.  Follow-up in 3 months.  Denies SI/HI.      Hypercholesterolemia    Chronic, ongoing.  Continue current medication regimen.  Lipid  panel next visit.         I discussed the assessment and treatment plan with the patient. The patient was provided an opportunity to ask questions and all were answered. The patient agreed with the plan and demonstrated an understanding of the instructions.   The patient was advised to call back or seek an in-person evaluation if the symptoms worsen or if the condition fails to improve as anticipated.   I provided 15 minutes of time during this encounter.  Follow up plan: Return in about 3 months (around 10/09/2018) for Mood, HTN/HLD (would like A1C testing).

## 2018-07-09 NOTE — Assessment & Plan Note (Signed)
Chronic, ongoing.  Continue current medication regimen.  Follow-up in 3 months.  Denies SI/HI.

## 2018-07-09 NOTE — Assessment & Plan Note (Signed)
Chronic, ongoing.  Continue current medication regimen.  Lipid panel next visit. 

## 2018-07-09 NOTE — Patient Instructions (Signed)
Depression Screening Depression screening is a tool that your health care provider can use to learn if you have symptoms of depression. Depression is a common condition with many symptoms that are also often found in other conditions. Depression is treatable, but it must first be diagnosed. You may not know that certain feelings, thoughts, and behaviors that you are having can be symptoms of depression. Taking a depression screening test can help you and your health care provider decide if you need more assessment, or if you should be referred to a mental health care provider. What are the screening tests?  You may have a physical exam to see if another condition is affecting your mental health. You may have a blood or urine sample taken during the physical exam.  You may be interviewed using a screening tool that was developed from research, such as one of these: ? Patient Health Questionnaire (PHQ). This is a set of either 2 or 9 questions. A health care provider who has been trained to score this screening test uses a guide to assess if your symptoms suggest that you may have depression. ? Hamilton Depression Rating Scale (HAM-D). This is a set of either 17 or 24 questions. You may be asked to take it again during or after your treatment, to see if your depression has gotten better. ? Beck Depression Inventory (BDI). This is a set of 21 multiple choice questions. Your health care provider scores your answers to assess:  Your level of depression, ranging from mild to severe.  Your response to treatment.  Your health care provider may talk with you about your daily activities, such as eating, sleeping, work, and recreation, and ask if you have had any changes in activity.  Your health care provider may ask you to see a mental health specialist, such as a psychiatrist or psychologist, for more evaluation. Who should be screened for depression?   All adults, including adults with a family history  of a mental health disorder.  Adolescents who are 12-18 years old.  People who are recovering from a myocardial infarction (MI).  Pregnant women, or women who have given birth.  People who have a long-term (chronic) illness.  Anyone who has been diagnosed with another type of a mental health disorder.  Anyone who has symptoms that could show depression. What do my results mean? Your health care provider will review the results of your depression screening, physical exam, and lab tests. Positive screens suggest that you may have depression. Screening is the first step in getting the care that you may need. It is up to you to get your screening results. Ask your health care provider, or the department that is doing your screening tests, when your results will be ready. Talk with your health care provider about your results and diagnosis. A diagnosis of depression is made using the Diagnostic and Statistical Manual of Mental Disorders (DSM-V). This is a book that lists the number and type of symptoms that must be present for a health care provider to give a specific diagnosis.  Your health care provider may work with you to treat your symptoms of depression, or your health care provider may help you find a mental health provider who can assess, diagnose, and treat your depression. Get help right away if:  You have thoughts about hurting yourself or others. If you ever feel like you may hurt yourself or others, or have thoughts about taking your own life, get help right away. You   can go to your nearest emergency department or call:  Your local emergency services (911 in the [place]  A suicide crisis helpline, such as the National Suicide Prevention Lifeline at 1-800-273-8255. This is open 24 hours a day. Summary  Depression screening is the first step in getting the help that you may need.  If your screening test shows symptoms of depression (is positive), your health care provider may ask  you to see a mental health provider.  Anyone who is age 12 or older should be screened for depression. This information is not intended to replace advice given to you by your health care provider. Make sure you discuss any questions you have with your health care provider. Document Released: 07/05/2016 Document Revised: 07/05/2016 Document Reviewed: 07/05/2016 Elsevier Interactive Patient Education  2019 [employer]  

## 2018-07-24 ENCOUNTER — Other Ambulatory Visit: Payer: Self-pay | Admitting: Unknown Physician Specialty

## 2018-07-31 ENCOUNTER — Other Ambulatory Visit: Payer: Self-pay | Admitting: Unknown Physician Specialty

## 2018-10-01 ENCOUNTER — Other Ambulatory Visit: Payer: Self-pay | Admitting: Nurse Practitioner

## 2018-10-14 ENCOUNTER — Ambulatory Visit: Payer: Medicare Other | Admitting: Nurse Practitioner

## 2018-10-14 ENCOUNTER — Other Ambulatory Visit: Payer: Self-pay

## 2018-10-14 ENCOUNTER — Encounter: Payer: Self-pay | Admitting: Nurse Practitioner

## 2018-10-14 VITALS — BP 116/76 | HR 62 | Temp 98.6°F | Ht 62.0 in | Wt 139.0 lb

## 2018-10-14 DIAGNOSIS — I10 Essential (primary) hypertension: Secondary | ICD-10-CM

## 2018-10-14 DIAGNOSIS — R1310 Dysphagia, unspecified: Secondary | ICD-10-CM

## 2018-10-14 DIAGNOSIS — F332 Major depressive disorder, recurrent severe without psychotic features: Secondary | ICD-10-CM

## 2018-10-14 DIAGNOSIS — E7800 Pure hypercholesterolemia, unspecified: Secondary | ICD-10-CM

## 2018-10-14 DIAGNOSIS — K219 Gastro-esophageal reflux disease without esophagitis: Secondary | ICD-10-CM | POA: Insufficient documentation

## 2018-10-14 MED ORDER — PANTOPRAZOLE SODIUM 20 MG PO TBEC: 20.0000 mg | DELAYED_RELEASE_TABLET | Freq: Every day | ORAL | 1 refills | Status: DC

## 2018-10-14 NOTE — Assessment & Plan Note (Signed)
Suspect related to GERD.  Will trial low dose of Protonix, script sent.  Referral to GI for further evaluation based on ongoing symptoms. TSH and CMP today.  Return in 4 weeks.

## 2018-10-14 NOTE — Progress Notes (Signed)
BP 116/76   Pulse 62   Temp 98.6 F (37 C) (Oral)   Ht 5' 2" (1.575 m)   Wt 139 lb (63 kg)   LMP  (LMP Unknown)   SpO2 94%   BMI 25.42 kg/m    Subjective:    Patient ID: Tabitha Larson, female    DOB: 1950/05/29, 68 y.o.   MRN: [id number]  HPI: Tabitha Larson is a 68 y.o. female  Chief Complaint  Patient presents with  . Depression    84mf/u  . Hypertension  . Hyperlipidemia   HYPERTENSION / HYPERLIPIDEMIA Continues on Amlodipine & HCTZ and Atorvastatin. Satisfied with current treatment? yes Duration of hypertension: chronic BP monitoring frequency: not checking BP range:  BP medication side effects: no Duration of hyperlipidemia: chronic Cholesterol medication side effects: no Cholesterol supplements: none Medication compliance: good compliance Aspirin: no Recent stressors: no Recurrent headaches: no Visual changes: no Palpitations: no Dyspnea: no Chest pain: no Lower extremity edema: no Dizzy/lightheaded: no   DEPRESSION Continues on Duloxetine.  Reports having more good days then bad days.  Denies SI/HI. Mood status: stable Satisfied with current treatment?: yes Symptom severity: mild  Duration of current treatment : chronic Side effects: no Medication compliance: good compliance Psychotherapy/counseling: none Depressed mood: occasional Anxious mood: no Anhedonia: no Significant weight loss or gain: no Insomnia: none Fatigue: no Feelings of worthlessness or guilt: no Impaired concentration/indecisiveness: no Suicidal ideations: no Hopelessness: no Crying spells: no [facility]2018 07/09/2018 05/18/2018 01/08/2018 10/08/2017  Decreased Interest 1 1 0 1 1  Down, Depressed, Hopeless 1 2 0 1 1  PHQ - 2 Score 2 3 0 2 2  Altered sleeping 1 2 - 1 2  Tired, decreased energy 1 3 - 1 2  Change in appetite 3 3 - 3 3  Feeling bad or failure about yourself  1 2 - 1 1  Trouble concentrating 1 2 - 1 1  Moving slowly or  fidgety/restless 0 0 - 1 0  Suicidal thoughts 0 0 - 0 0  PHQ-9 Score 9 15 - 10 11  Difficult doing work/chores Not difficult at all Somewhat difficult - - -  Some recent data might be hidden   DYSPHAGIA Currently reports that 2 months ago started having difficulty swallowing.  Has to take little bites, which helps.  If larger bites feels like food is stuck.  Endorses heart burn and she takes [facility]ounselling psychologistfor this, does not have it every day or every week.  Has issues with heart burn a few times a month.  Last colonoscopy 2017, goes every 4 years due to history of sister with colon CA.  Has dentures, has had awhile.  Smoked 30 years ago, reports no alcohol use. Duration: chronic Description of symptom: Onset: Immediately upon swallowing Location of dysphagia: throat Dysphagia to solids only: yes Dysphagia to solids & liquids: no  Frequency:a few times a day  Progressively getting worse: no Alleviatiating factors: eating smaller bites Provoking factors: unknown Status: stable EGD: no Weight loss: no Sensation of lump in throat: no Heartburn: yes Odynophagia: no Nausea: no Vomiting: no Drooling/nasal regurgitation/food spillage: no Coughing/choking/dysphonia: no, but feels like is going to choke Dysarthria: no Hematemesis: no Regurgitation of undigested food/halitosis: no Chest pain: no  Relevant past medical, surgical, family and social history reviewed and updated as indicated. Interim medical history since our last visit reviewed. Allergies and medications reviewed and updated.  Review of Systems  Constitutional: Negative  for activity change, appetite change, diaphoresis, fatigue and fever.  Respiratory: Negative for cough, chest tightness and shortness of breath.   Cardiovascular: Negative for chest pain, palpitations and leg swelling.  Gastrointestinal: Negative for abdominal distention, abdominal pain, constipation, diarrhea, nausea and vomiting.  Endocrine: Negative for  cold intolerance and heat intolerance.  Neurological: Negative for dizziness, syncope, weakness, light-headedness, numbness and headaches.  Psychiatric/Behavioral: Negative.     Per HPI unless specifically indicated above     Objective:    BP 116/76   Pulse 62   Temp 98.6 F (37 C) (Oral)   Ht 5' 2" (1.575 m)   Wt 139 lb (63 kg)   LMP  (LMP Unknown)   SpO2 94%   BMI 25.42 kg/m   Wt Readings from Last 3 Encounters:  10/14/18 139 lb (63 kg)  05/18/18 140 lb 1.6 oz (63.5 kg)  01/08/18 141 lb 12.8 oz (64.3 kg)    Physical Exam Vitals signs and nursing note reviewed.  Constitutional:      General: She is awake. She is not in acute distress.    Appearance: She is well-developed. She is not ill-appearing.  HENT:     Head: Normocephalic.     Right Ear: Hearing normal.     Left Ear: Hearing normal.     Nose: Nose normal.     Mouth/Throat:     Mouth: Mucous membranes are moist.     Pharynx: Oropharynx is clear.  Eyes:     General: Lids are normal.        Right eye: No discharge.        Left eye: No discharge.     Conjunctiva/sclera: Conjunctivae normal.     Pupils: Pupils are equal, round, and reactive to light.  Neck:     Musculoskeletal: Normal range of motion and neck supple.     Thyroid: No thyromegaly.     Vascular: No carotid bruit.  Cardiovascular:     Rate and Rhythm: Normal rate and regular rhythm.     Heart sounds: Normal heart sounds. No murmur. No gallop.   Pulmonary:     Effort: Pulmonary effort is normal. No accessory muscle usage or respiratory distress.     Breath sounds: Normal breath sounds.  Abdominal:     General: Bowel sounds are normal.     Palpations: Abdomen is soft. There is no hepatomegaly or splenomegaly.     Tenderness: There is no abdominal tenderness.     Comments: Able to swallow liquids without pause or difficulty.  Musculoskeletal:     Right lower leg: No edema.     Left lower leg: No edema.  Lymphadenopathy:     Cervical: No  cervical adenopathy.  Skin:    General: Skin is warm and dry.  Neurological:     Mental Status: She is alert and oriented to person, place, and time.  Psychiatric:        Attention and Perception: Attention normal.        Mood and Affect: Mood normal.        Speech: Speech normal.        Behavior: Behavior normal. Behavior is cooperative.        Thought Content: Thought content normal.        Judgment: Judgment normal.     Results for orders placed or performed in visit on 01/08/18  Lipid Profile  Result Value Ref Range   Cholesterol, Total 182 100 - 199 mg/dL   Triglycerides  93 0 - 149 mg/dL   HDL 46 >39 mg/dL   VLDL Cholesterol Cal 19 5 - 40 mg/dL   LDL Calculated 117 (H) 0 - 99 mg/dL   Chol/HDL Ratio 4.0 0.0 - 4.4 ratio  Comp Met (CMET)  Result Value Ref Range   Glucose 91 65 - 99 mg/dL   BUN 13 8 - 27 mg/dL   Creatinine, Ser 0.66 0.57 - 1.00 mg/dL   GFR calc non Af Amer 90 >59 mL/min/1.73   GFR calc Af Amer 104 >59 mL/min/1.73   BUN/Creatinine Ratio 20 12 - 28   Sodium 141 134 - 144 mmol/L   Potassium 3.6 3.5 - 5.2 mmol/L   Chloride 100 96 - 106 mmol/L   CO2 25 20 - 29 mmol/L   Calcium 9.6 8.7 - 10.3 mg/dL   Total Protein 7.3 6.0 - 8.5 g/dL   Albumin 4.7 3.6 - 4.8 g/dL   Globulin, Total 2.6 1.5 - 4.5 g/dL   Albumin/Globulin Ratio 1.8 1.2 - 2.2   Bilirubin Total 0.5 0.0 - 1.2 mg/dL   Alkaline Phosphatase 83 39 - 117 IU/L   AST 20 0 - 40 IU/L   ALT 20 0 - 32 IU/L      Assessment & Plan:   Problem List Items Addressed This Visit      Cardiovascular and Mediastinum   Benign hypertension    Chronic, stable with BP at goal today.  Continue current medication regimen and adjust as needed. Recommend checking BP at home 3 mornings a week and documenting for provider.        Relevant Orders   Comprehensive metabolic panel     Digestive   Dysphagia    Suspect related to GERD.  Will trial low dose of Protonix, script sent.  Referral to GI for further evaluation  based on ongoing symptoms. TSH and CMP today.  Return in 4 weeks.      Relevant Orders   Ambulatory referral to Gastroenterology   TSH     Other   Major depression - Primary    Chronic, stable without SI/HI.  Continue current medication regimen and adjust as needed.        Hypercholesterolemia    Chronic, ongoing.  Continue current medication regimen and adjust as needed. Lipid panel and CMP today.        Relevant Orders   Comprehensive metabolic panel   Lipid Panel w/o Chol/HDL Ratio       Follow up plan: Return in about 4 weeks (around 11/11/2018) for Dysphagia & heart burn.

## 2018-10-14 NOTE — Assessment & Plan Note (Signed)
Chronic, stable with BP at goal today.  Continue current medication regimen and adjust as needed. Recommend checking BP at home 3 mornings a week and documenting for provider.

## 2018-10-14 NOTE — Assessment & Plan Note (Signed)
Chronic, stable without SI/HI.  Continue current medication regimen and adjust as needed.

## 2018-10-14 NOTE — Assessment & Plan Note (Signed)
Chronic, ongoing.  Continue current medication regimen and adjust as needed.  Lipid panel and CMP today.    

## 2018-10-14 NOTE — Patient Instructions (Signed)
Heartburn Heartburn is a type of pain or discomfort that can happen in the throat or chest. It is often described as a burning pain. It may also cause a bad, acid-like taste in the mouth. Heartburn may feel worse when you lie down or bend over. It may be worse at night. It may be caused by stomach contents that move back up (reflux) into the tube that connects the mouth with the stomach (esophagus). Follow these instructions at home: Eating and drinking   Avoid certain foods and drinks as told by your doctor. This may include: ? Coffee and tea (with or without caffeine). ? Drinks that have alcohol. ? Energy drinks and sports drinks. ? Carbonated drinks or sodas. ? Chocolate and cocoa. ? Peppermint and mint flavorings. ? Garlic and onions. ? Horseradish. ? Spicy and acidic foods, such as:  Peppers.  Chili powder and curry powder.  Vinegar.  Hot sauces and BBQ sauce. ? Citrus fruit juices and citrus fruits, such as:  Oranges.  Lemons.  Limes. ? Tomato-based foods, such as:  Red sauce and pizza with red sauce.  Chili.  Salsa. ? Fried and fatty foods, such as:  Donuts.  French fries and potato chips.  High-fat dressings. ? High-fat meats, such as:  Hot dogs and sausage.  Rib eye steak.  Ham and bacon. ? High-fat dairy items, such as:  Whole milk.  Butter.  Cream cheese.  Eat small meals often. Avoid eating large meals.  Avoid drinking large amounts of liquid with your meals.  Avoid eating meals during the 2-3 hours before bedtime.  Avoid lying down right after you eat.  Do not exercise right after you eat. Lifestyle      If you are overweight, lose an amount of weight that is healthy for you. Ask your doctor about a safe weight loss goal.  Do not use any products that contain nicotine or tobacco, including cigarettes, e-cigarettes, and chewing tobacco. These can make your symptoms worse. If you need help quitting, ask your doctor.  Wear loose  clothes. Do not wear anything tight around your waist.  Raise (elevate) the head of your bed about 6 inches (15 cm) when you sleep.  Try to lower your stress. If you need help doing this, ask your doctor. General instructions  Pay attention to any changes in your symptoms.  Take over-the-counter and prescription medicines only as told by your doctor. ? Do not take aspirin, ibuprofen, or other NSAIDs unless your doctor says it is okay. ? Stop medicines only as told by your doctor.  Keep all follow-up visits as told by your doctor. This is important. Contact a doctor if:  You have new symptoms.  You lose weight and you do not know why it is happening.  You have trouble swallowing, or it hurts to swallow.  You have wheezing or a cough that keeps happening.  Your symptoms do not get better with treatment.  You have heartburn often for more than 2 weeks. Get help right away if:  You have pain in your arms, neck, jaw, teeth, or back.  You feel sweaty, dizzy, or light-headed.  You have chest pain or shortness of breath.  You throw up (vomit) and your throw up looks like blood or coffee grounds.  Your poop (stool) is bloody or black. These symptoms may represent a serious problem that is an emergency. Do not wait to see if the symptoms will go away. Get medical help right away. Call your local   emergency services (911 in the [place] Do not drive yourself to the hospital. Summary  Heartburn is a type of pain that can happen in the throat or chest. It can feel like a burning pain. It may also cause a bad, acid-like taste in the mouth.  You may need to avoid certain foods and drinks to help your symptoms. Ask your doctor what foods and drinks you should avoid.  Take over-the-counter and prescription medicines only as told by your doctor. Do not take aspirin, ibuprofen, or other NSAIDs unless your doctor told you to do so.  Contact your doctor if your symptoms do not get better or  they get worse. This information is not intended to replace advice given to you by your health care provider. Make sure you discuss any questions you have with your health care provider. Document Released: 10/31/2010 Document Revised: 07/21/2017 Document Reviewed: 07/21/2017 Elsevier Patient Education  2020 [employer]  

## 2018-10-15 LAB — COMPREHENSIVE METABOLIC PANEL
ALT: 17 IU/L (ref 0–32)
AST: 19 IU/L (ref 0–40)
Albumin/Globulin Ratio: 1.8 (ref 1.2–2.2)
Albumin: 4.5 g/dL (ref 3.8–4.8)
Alkaline Phosphatase: 90 IU/L (ref 39–117)
BUN/Creatinine Ratio: 18 (ref 12–28)
BUN: 13 mg/dL (ref 8–27)
Bilirubin Total: 0.4 mg/dL (ref 0.0–1.2)
CO2: 25 mmol/L (ref 20–29)
Calcium: 9.9 mg/dL (ref 8.7–10.3)
Chloride: 99 mmol/L (ref 96–106)
Creatinine, Ser: 0.73 mg/dL (ref 0.57–1.00)
GFR calc Af Amer: 97 mL/min/{1.73_m2} (ref >59)
GFR calc non Af Amer: 84 mL/min/{1.73_m2} (ref >59)
Globulin, Total: 2.5 g/dL (ref 1.5–4.5)
Glucose: 96 mg/dL (ref 65–99)
Potassium: 4.2 mmol/L (ref 3.5–5.2)
Sodium: 139 mmol/L (ref 134–144)
Total Protein: 7 g/dL (ref 6.0–8.5)

## 2018-10-15 LAB — TSH: TSH: 2.23 u[IU]/mL (ref 0.450–4.500)

## 2018-10-15 LAB — LIPID PANEL W/O CHOL/HDL RATIO
Cholesterol, Total: 191 mg/dL (ref 100–199)
HDL: 45 mg/dL (ref >39)
LDL Calculated: 124 mg/dL — ABNORMAL HIGH (ref 0–99)
Triglycerides: 112 mg/dL (ref 0–149)
VLDL Cholesterol Cal: 22 mg/dL (ref 5–40)

## 2018-11-11 ENCOUNTER — Ambulatory Visit: Payer: Medicare Other | Admitting: Nurse Practitioner

## 2018-11-11 ENCOUNTER — Encounter: Payer: Self-pay | Admitting: Nurse Practitioner

## 2018-11-11 ENCOUNTER — Other Ambulatory Visit: Payer: Self-pay

## 2018-11-11 VITALS — BP 113/77 | HR 65 | Temp 98.5°F | Ht 62.0 in | Wt 140.0 lb

## 2018-11-11 DIAGNOSIS — R1310 Dysphagia, unspecified: Secondary | ICD-10-CM

## 2018-11-11 DIAGNOSIS — Z23 Encounter for immunization: Secondary | ICD-10-CM | POA: Diagnosis not present

## 2018-11-11 DIAGNOSIS — L989 Disorder of the skin and subcutaneous tissue, unspecified: Secondary | ICD-10-CM | POA: Diagnosis not present

## 2018-11-11 NOTE — Patient Instructions (Signed)
Seborrheic Keratosis A seborrheic keratosis is a common, noncancerous (benign) skin growth. These growths are velvety, waxy, rough, tan, brown, or black spots that appear on the skin. These skin growths can be flat or raised, and scaly. What are the causes? The cause of this condition is not known. What increases the risk? You are more likely to develop this condition if you:  Have a family history of seborrheic keratosis.  Are 50 or older.  Are pregnant.  Have had estrogen replacement therapy. What are the signs or symptoms? Symptoms of this condition include growths on the face, chest, shoulders, back, or other areas. These growths:  Are usually painless, but may become irritated and itchy.  Can be yellow, brown, black, or other colors.  Are slightly raised or have a flat surface.  Are sometimes rough or wart-like in texture.  Are often velvety or waxy on the surface.  Are round or oval-shaped.  Often occur in groups, but may occur as a single growth. How is this diagnosed? This condition is diagnosed with a medical history and physical exam.  A sample of the growth may be tested (skin biopsy).  You may need to see a skin specialist (dermatologist). How is this treated? Treatment is not usually needed for this condition, unless the growths are irritated or bleed often.  You may also choose to have the growths removed if you do not like their appearance. ? Most commonly, these growths are treated with a procedure in which liquid nitrogen is applied to "freeze" off the growth (cryosurgery). ? They may also be burned off with electricity (electrocautery) or removed by scraping (curettage). Follow these instructions at home:  Watch your growth for any changes.  Keep all follow-up visits as told by your health care provider. This is important.  Do not scratch or pick at the growth or growths. This can cause them to become irritated or infected. Contact a health care  provider if:  You suddenly have many new growths.  Your growth bleeds, itches, or hurts.  Your growth suddenly becomes larger or changes color. Summary  A seborrheic keratosis is a common, noncancerous (benign) skin growth.  Treatment is not usually needed for this condition, unless the growths are irritated or bleed often.  Watch your growth for any changes.  Contact a health care provider if you suddenly have many new growths or your growth suddenly becomes larger or changes color.  Keep all follow-up visits as told by your health care provider. This is important. This information is not intended to replace advice given to you by your health care provider. Make sure you discuss any questions you have with your health care provider. Document Released: 03/23/2010 Document Revised: 07/03/2017 Document Reviewed: 07/03/2017 Elsevier Patient Education  2020 [employer]vier Inc.  

## 2018-11-11 NOTE — Assessment & Plan Note (Signed)
Suspect seborrheic keratosis.  Offered further assessment, but patient wishes to monitor at this time.  If any changes in size, color, or shape then will refer to dermatology.

## 2018-11-11 NOTE — Progress Notes (Signed)
BP 113/77   Pulse 65   Temp 98.5 F (36.9 C) (Oral)   Ht 5\' 2"  (1.575 m)   Wt 140 lb (63.5 kg)   LMP  (LMP Unknown)   SpO2 98%   BMI 25.61 kg/m    Subjective:    Patient ID: Tabitha Larson, female    DOB: 28-Jul-1950, 68 y.o.   MRN: [id number]  HPI: Tabitha Larson is a 68 y.o. female  Chief Complaint  Patient presents with  . Dysphagia    f/u  . Heartburn  . Mold    on the upper back. first noticed about a month ago   GERD/DYSPHAGIA Started on Protonix last visit, which she reports has helped with swallowing and heart burn.  Sees Dr. Allen Norris next week with GI.  She states "I have not had acid reflex lately".   GERD control status: stable  Satisfied with current treatment? yes Heartburn frequency: none Medication side effects: no  Medication compliance: good Previous GERD medications: Antacid use frequency:  none Duration: weeks Heartburn duration: none Alleviatiating factors:  Protonix Aggravating factors: certain foods Dysphagia: still occasionally feels like something getting stuck there or tickle, but not as bad as it was Odynophagia:  no Hematemesis: no Blood in stool: no EGD: no   SKIN LESION Is not sure how long area has been present to upper mid-back.   Duration: unknown Location: mid-upper back Painful: no Itching: no Onset: gradual Context: not changing Associated signs and symptoms:  History of skin cancer: no History of precancerous skin lesions: no Family history of skin cancer: no   Relevant past medical, surgical, family and social history reviewed and updated as indicated. Interim medical history since our last visit reviewed. Allergies and medications reviewed and updated.  Review of Systems  Constitutional: Negative for activity change, appetite change, diaphoresis, fatigue and fever.  Respiratory: Negative for cough, chest tightness and shortness of breath.   Cardiovascular: Negative for chest pain, palpitations and leg swelling.   Gastrointestinal: Negative for abdominal distention, abdominal pain, constipation, diarrhea, nausea and vomiting.  Neurological: Negative for dizziness, syncope, weakness, light-headedness, numbness and headaches.  Psychiatric/Behavioral: Negative.     Per HPI unless specifically indicated above     Objective:    BP 113/77   Pulse 65   Temp 98.5 F (36.9 C) (Oral)   Ht 5\' 2"  (1.575 m)   Wt 140 lb (63.5 kg)   LMP  (LMP Unknown)   SpO2 98%   BMI 25.61 kg/m   Wt Readings from Last 3 Encounters:  11/11/18 140 lb (63.5 kg)  10/14/18 139 lb (63 kg)  05/18/18 140 lb 1.6 oz (63.5 kg)    Physical Exam Vitals signs and nursing note reviewed.  Constitutional:      General: She is awake. She is not in acute distress.    Appearance: She is well-developed. She is obese. She is not ill-appearing.  HENT:     Head: Normocephalic.     Right Ear: Hearing normal.     Left Ear: Hearing normal.  Eyes:     General: Lids are normal.        Right eye: No discharge.        Left eye: No discharge.     Conjunctiva/sclera: Conjunctivae normal.     Pupils: Pupils are equal, round, and reactive to light.  Neck:     Musculoskeletal: Normal range of motion and neck supple.     Vascular: No carotid bruit.  Cardiovascular:  Rate and Rhythm: Normal rate and regular rhythm.     Heart sounds: Normal heart sounds. No murmur. No gallop.   Pulmonary:     Effort: Pulmonary effort is normal. No accessory muscle usage or respiratory distress.     Breath sounds: Normal breath sounds.  Abdominal:     General: Bowel sounds are normal.     Palpations: Abdomen is soft. There is no hepatomegaly or splenomegaly.     Tenderness: There is no abdominal tenderness.  Musculoskeletal:     Right lower leg: No edema.     Left lower leg: No edema.  Skin:    General: Skin is warm and dry.       Neurological:     Mental Status: She is alert and oriented to person, place, and time.  Psychiatric:        Attention  and Perception: Attention normal.        Mood and Affect: Mood normal.        Behavior: Behavior normal. Behavior is cooperative.        Thought Content: Thought content normal.        Judgment: Judgment normal.     Results for orders placed or performed in visit on 10/14/18  Comprehensive metabolic panel  Result Value Ref Range   Glucose 96 65 - 99 mg/dL   BUN 13 8 - 27 mg/dL   Creatinine, Ser 0.73 0.57 - 1.00 mg/dL   GFR calc non Af Amer 84 >59 mL/min/1.73   GFR calc Af Amer 97 >59 mL/min/1.73   BUN/Creatinine Ratio 18 12 - 28   Sodium 139 134 - 144 mmol/L   Potassium 4.2 3.5 - 5.2 mmol/L   Chloride 99 96 - 106 mmol/L   CO2 25 20 - 29 mmol/L   Calcium 9.9 8.7 - 10.3 mg/dL   Total Protein 7.0 6.0 - 8.5 g/dL   Albumin 4.5 3.8 - 4.8 g/dL   Globulin, Total 2.5 1.5 - 4.5 g/dL   Albumin/Globulin Ratio 1.8 1.2 - 2.2   Bilirubin Total 0.4 0.0 - 1.2 mg/dL   Alkaline Phosphatase 90 39 - 117 IU/L   AST 19 0 - 40 IU/L   ALT 17 0 - 32 IU/L  Lipid Panel w/o Chol/HDL Ratio  Result Value Ref Range   Cholesterol, Total 191 100 - 199 mg/dL   Triglycerides 112 0 - 149 mg/dL   HDL 45 >39 mg/dL   VLDL Cholesterol Cal 22 5 - 40 mg/dL   LDL Calculated 124 (H) 0 - 99 mg/dL  TSH  Result Value Ref Range   TSH 2.230 0.450 - 4.500 uIU/mL      Assessment & Plan:   Problem List Items Addressed This Visit      Digestive   Dysphagia    Some improvement with Protonix, but occasional intermittent episodes persist.  Maintain GI visit and review notes upon availability.  Continue Protonix at this time.  Avoid NSAID use and keep food journal to document items that cause GERD.          Musculoskeletal and Integument   Skin lesion of back    Suspect seborrheic keratosis.  Offered further assessment, but patient wishes to monitor at this time.  If any changes in size, color, or shape then will refer to dermatology.       Other Visit Diagnoses    Flu vaccine need    -  Primary   Relevant Orders    Flu Vaccine QUAD High  Dose(Fluad) (Completed)       Follow up plan: Return in about 6 months (around 05/11/2019) for Mood, GERD, HTN/HLD.

## 2018-11-11 NOTE — Assessment & Plan Note (Signed)
Some improvement with Protonix, but occasional intermittent episodes persist.  Maintain GI visit and review notes upon availability.  Continue Protonix at this time.  Avoid NSAID use and keep food journal to document items that cause GERD.

## 2018-11-12 ENCOUNTER — Other Ambulatory Visit: Payer: Self-pay | Admitting: Nurse Practitioner

## 2018-11-16 ENCOUNTER — Other Ambulatory Visit: Payer: Self-pay

## 2018-11-16 ENCOUNTER — Encounter: Payer: Self-pay | Admitting: Gastroenterology

## 2018-11-16 ENCOUNTER — Ambulatory Visit (INDEPENDENT_AMBULATORY_CARE_PROVIDER_SITE_OTHER): Payer: Medicare Other | Admitting: Gastroenterology

## 2018-11-16 ENCOUNTER — Encounter (INDEPENDENT_AMBULATORY_CARE_PROVIDER_SITE_OTHER): Payer: Self-pay

## 2018-11-16 VITALS — BP 115/74 | HR 79 | Temp 98.0°F | Ht 62.0 in | Wt 140.8 lb

## 2018-11-16 DIAGNOSIS — R1310 Dysphagia, unspecified: Secondary | ICD-10-CM

## 2018-11-16 NOTE — Progress Notes (Signed)
Primary Care Physician: Venita Lick, NP  Primary Gastroenterologist:  Dr. Lucilla Lame  Chief Complaint  Patient presents with  . Dysphagia    HPI: Tabitha Larson is a 68 y.o. female here for dysphasia.  The patient had seen me in the past for colonoscopy in 2017 and was found to have 2 tubular adenomas and was suggested to have a repeat colonoscopy in 5 years.  The patient was in contact with her PCP and was noted to have dysphasia and was started on Protonix.  The patient's Protonix helps her symptoms but did not make them completely go away.  The patient was encouraged to follow-up with me for the dysphasia. The patient reports her symptoms to be worrisome since she is concerned about cancer.  There is no report of any black stools or bloody stools.  She also reports that the dysphasia is more to solids than it is to liquids.  The patient also denies any unexplained weight loss.  Current Outpatient Medications  Medication Sig Dispense Refill  . amLODipine (NORVASC) 5 MG tablet Take 1 tablet by mouth once daily 90 tablet 0  . atorvastatin (LIPITOR) 40 MG tablet Take 1 tablet by mouth once daily 90 tablet 0  . DULoxetine (CYMBALTA) 60 MG capsule Take 1 capsule by mouth once daily 90 capsule 0  . hydrochlorothiazide (HYDRODIURIL) 25 MG tablet Take 1 tablet by mouth once daily 90 tablet 0  . pantoprazole (PROTONIX) 20 MG tablet Take 1 tablet (20 mg total) by mouth daily. 30 tablet 1   No current facility-administered medications for this visit.     Allergies as of 11/16/2018  . (No Known Allergies)    ROS:  General: Negative for anorexia, weight loss, fever, chills, fatigue, weakness. ENT: Negative for hoarseness, difficulty swallowing , nasal congestion. CV: Negative for chest pain, angina, palpitations, dyspnea on exertion, peripheral edema.  Respiratory: Negative for dyspnea at rest, dyspnea on exertion, cough, sputum, wheezing.  GI: See history of present illness.  GU:  Negative for dysuria, hematuria, urinary incontinence, urinary frequency, nocturnal urination.  Endo: Negative for unusual weight change.    Physical Examination:   BP 115/74   Pulse 79   Temp 98 F (36.7 C) (Temporal)   Ht 5\' 2"  (1.575 m)   Wt 140 lb 12.8 oz (63.9 kg)   LMP  (LMP Unknown)   BMI 25.75 kg/m   General: Well-nourished, well-developed in no acute distress.  Eyes: No icterus. Conjunctivae pink. Mouth: Oropharyngeal mucosa moist and pink , no lesions erythema or exudate. Lungs: Clear to auscultation bilaterally. Non-labored. Heart: Regular rate and rhythm, no murmurs rubs or gallops.  Abdomen: Bowel sounds are normal, nontender, nondistended, no hepatosplenomegaly or masses, no abdominal bruits or hernia , no rebound or guarding.   Extremities: No lower extremity edema. No clubbing or deformities. Neuro: Alert and oriented x 3.  Grossly intact. Skin: Warm and dry, no jaundice.   Psych: Alert and cooperative, normal mood and affect.  Labs:    Imaging Studies: No results found.  Assessment and Plan:   Tabitha Larson is a 68 y.o. y/o female who comes in today with a history of dysphasia.  The patient has dysphasia more to solids than liquids.  There is no report of any worry symptoms such as weight loss black stools bloody stools hematemesis nausea or vomiting.  The patient will be set up for an EGD. I have discussed risks & benefits which include, but are not  limited to, bleeding, infection, perforation & drug reaction.  The patient agrees with this plan & written consent will be obtained.       Lucilla Lame, MD. Tabitha Larson   Note: This dictation was prepared with Dragon dictation along with smaller phrase technology. Any transcriptional errors that result from this process are unintentional.

## 2018-12-08 ENCOUNTER — Ambulatory Visit: Payer: Medicare Other | Admitting: Nurse Practitioner

## 2018-12-08 ENCOUNTER — Ambulatory Visit: Payer: Self-pay

## 2018-12-08 ENCOUNTER — Ambulatory Visit
Admission: RE | Admit: 2018-12-08 | Discharge: 2018-12-08 | Disposition: A | Discharge status: Home / Self Care | Payer: Medicare Other | Source: Ambulatory Visit | Attending: Nurse Practitioner | Admitting: Nurse Practitioner

## 2018-12-08 ENCOUNTER — Telehealth: Payer: Self-pay | Admitting: Nurse Practitioner

## 2018-12-08 ENCOUNTER — Other Ambulatory Visit: Payer: Self-pay

## 2018-12-08 ENCOUNTER — Encounter: Payer: Self-pay | Admitting: Nurse Practitioner

## 2018-12-08 VITALS — BP 124/77 | HR 63 | Temp 98.3°F

## 2018-12-08 DIAGNOSIS — R0781 Pleurodynia: Secondary | ICD-10-CM

## 2018-12-08 NOTE — Telephone Encounter (Signed)
General/Other - Odette Horns results  [facility] calling [facility]   Left Rib ; negative results

## 2018-12-08 NOTE — Telephone Encounter (Signed)
Patient returned call for x-ray results.  Read patient results Left Rib; negative results. Patient did not have nay further questions.

## 2018-12-08 NOTE — Telephone Encounter (Signed)
Pt. Reports she fell out of bed last night and landed on her ribs. Pain is 6-7/10. Hurts when she takes a deep breath. No shortness of breath. No cuts or lacerations. Reports she has a bruise to her forehead, "but that doesn't hurt." Warm transfer to [facility] in the practice for a visit.  Answer Assessment - Initial Assessment Questions 1. MECHANISM: "How did the injury happen?"     Fell last night 2. ONSET: "When did the injury happen?" (Minutes or hours ago)     Last night 3. LOCATION: "Where on the chest is the injury located?"     Left ribs 4. APPEARANCE: "What does the injury look like?"     No bruising 5. BLEEDING: "Is there any bleeding now? If so, ask: How long has it been bleeding?"     No 6. SEVERITY: "Any difficulty with breathing?"     Hurts with deep breath 7. SIZE: For cuts, bruises, or swelling, ask: "How large is it?" (e.g., inches or centimeters)     n/a 8. PAIN: "Is there pain?" If so, ask: "How bad is the pain?"   (e.g., Scale 1-10; or mild, moderate, severe)     6-7 9. TETANUS: For any breaks in the skin, ask: "When was the last tetanus booster?"     Unsure 10. PREGNANCY: "Is there any chance you are pregnant?" "When was your last menstrual period?"       No  Protocols used: CHEST INJURY-A-AH

## 2018-12-08 NOTE — Progress Notes (Signed)
BP 124/77   Pulse 63   Temp 98.3 F (36.8 C) (Oral)   LMP  (LMP Unknown)   SpO2 98%    Subjective:    Patient ID: Tabitha Larson, female    DOB: Apr 10, 1950, 68 y.o.   MRN: [id number]  HPI: Tabitha Larson is a 68 y.o. female  Chief Complaint  Patient presents with  . Fall    pt states she fell out of bed last night, states she is sore    FALL: Was dreaming and running in dream, states she then feels she tried to run in bed which led to her falling out of bed.  Stays with her cousin at night, sleeps in regular double bed.  Bed is not far off the ground, maybe a couple feet.  Fall took place early this morning around 4-5 am.  When fell landed on left side with subsequent bruise to anterior occiput and discomfort left upper ribs.  Denies difficulty breathing with normal breathing, but discomfort with deep breathing and moving.  Has not taken any pain medication at home.  States the pain is not unbearable, maybe a 4-5/10, intermittent, dull/aching more when she takes deeper breaths.  Is able to walk with minimal discomfort.  Denies any syncope, LOC, SOB, CP, fever, cough, or bruising.  States supporting area when she takes a big breath makes pain better.  Does not want any pain medication ordered as she "has things to do today and don't like taking that stuff".   Of note osteopenia on bone scan 2019, does report taking daily supplements.  Denies use of ASA and no blood thinners on record.  Relevant past medical, surgical, family and social history reviewed and updated as indicated. Interim medical history since our last visit reviewed. Allergies and medications reviewed and updated.  Review of Systems  Constitutional: Negative for activity change, appetite change, diaphoresis, fatigue and fever.  Respiratory: Negative for cough, chest tightness, shortness of breath and wheezing.   Cardiovascular: Negative for chest pain, palpitations and leg swelling.  Gastrointestinal: Negative for  abdominal distention, abdominal pain, constipation, diarrhea, nausea and vomiting.  Neurological: Negative for dizziness, syncope, weakness, light-headedness, numbness and headaches.  Psychiatric/Behavioral: Negative.     Per HPI unless specifically indicated above     Objective:    BP 124/77   Pulse 63   Temp 98.3 F (36.8 C) (Oral)   LMP  (LMP Unknown)   SpO2 98%   Wt Readings from Last 3 Encounters:  11/16/18 140 lb 12.8 oz (63.9 kg)  11/11/18 140 lb (63.5 kg)  10/14/18 139 lb (63 kg)    Physical Exam Vitals signs and nursing note reviewed.  Constitutional:      General: She is awake. She is not in acute distress.    Appearance: She is well-developed. She is not ill-appearing.  HENT:     Head: Normocephalic.      Right Ear: Hearing normal.     Left Ear: Hearing normal.  Eyes:     General: Lids are normal.        Right eye: No discharge.        Left eye: No discharge.     Extraocular Movements: Extraocular movements intact.     Conjunctiva/sclera: Conjunctivae normal.     Pupils: Pupils are equal, round, and reactive to light.     Visual Fields: Right eye visual fields normal and left eye visual fields normal.  Neck:     Musculoskeletal: Normal range  of motion and neck supple.     Vascular: No carotid bruit.  Cardiovascular:     Rate and Rhythm: Normal rate and regular rhythm.     Heart sounds: Normal heart sounds. No murmur. No gallop.   Pulmonary:     Effort: Pulmonary effort is normal. No accessory muscle usage or respiratory distress.     Breath sounds: Normal breath sounds.     Comments: Clear throughout all lung fields. Abdominal:     General: Bowel sounds are normal.     Palpations: Abdomen is soft.     Tenderness: There is no abdominal tenderness.  Musculoskeletal:       Arms:     Right lower leg: No edema.     Left lower leg: No edema.  Skin:    General: Skin is warm and dry.  Neurological:     Mental Status: She is alert and oriented to person,  place, and time.     Cranial Nerves: Cranial nerves are intact.     Gait: Gait is intact.     Deep Tendon Reflexes: Reflexes are normal and symmetric.     Reflex Scores:      Brachioradialis reflexes are 2+ on the right side and 2+ on the left side.      Patellar reflexes are 2+ on the right side and 2+ on the left side. Psychiatric:        Attention and Perception: Attention normal.        Mood and Affect: Mood normal.        Behavior: Behavior normal. Behavior is cooperative.        Thought Content: Thought content normal.        Judgment: Judgment normal.     Results for orders placed or performed in visit on 10/14/18  Comprehensive metabolic panel  Result Value Ref Range   Glucose 96 65 - 99 mg/dL   BUN 13 8 - 27 mg/dL   Creatinine, Ser 0.73 0.57 - 1.00 mg/dL   GFR calc non Af Amer 84 >59 mL/min/1.73   GFR calc Af Amer 97 >59 mL/min/1.73   BUN/Creatinine Ratio 18 12 - 28   Sodium 139 134 - 144 mmol/L   Potassium 4.2 3.5 - 5.2 mmol/L   Chloride 99 96 - 106 mmol/L   CO2 25 20 - 29 mmol/L   Calcium 9.9 8.7 - 10.3 mg/dL   Total Protein 7.0 6.0 - 8.5 g/dL   Albumin 4.5 3.8 - 4.8 g/dL   Globulin, Total 2.5 1.5 - 4.5 g/dL   Albumin/Globulin Ratio 1.8 1.2 - 2.2   Bilirubin Total 0.4 0.0 - 1.2 mg/dL   Alkaline Phosphatase 90 39 - 117 IU/L   AST 19 0 - 40 IU/L   ALT 17 0 - 32 IU/L  Lipid Panel w/o Chol/HDL Ratio  Result Value Ref Range   Cholesterol, Total 191 100 - 199 mg/dL   Triglycerides 112 0 - 149 mg/dL   HDL 45 >39 mg/dL   VLDL Cholesterol Cal 22 5 - 40 mg/dL   LDL Calculated 124 (H) 0 - 99 mg/dL  TSH  Result Value Ref Range   TSH 2.230 0.450 - 4.500 uIU/mL      Assessment & Plan:   Problem List Items Addressed This Visit      Other   Rib pain on left side - Primary    Post fall out of bed this morning.  Will obtain STAT imaging left side ribs to assess  for fracture.  Discussed at length plan of care with close monitoring if fracture present.  She does not  want pain medication ordered.  Prefers to use Ibuprofen and Tylenol as needed. Discussed placing ice to area every couple hours for twenty minutes.  Educated on deep breathing exercises to perform throughout day, while applying support to area (PNA prevention).  Return for worsening or continued issues, if fx will have return over next couple weeks.      Relevant Orders   DG Ribs Unilateral Left       Follow up plan: Return if symptoms worsen or fail to improve.

## 2018-12-08 NOTE — Patient Instructions (Signed)

## 2018-12-08 NOTE — Assessment & Plan Note (Signed)
Post fall out of bed this morning.  Will obtain STAT imaging left side ribs to assess for fracture.  Discussed at length plan of care with close monitoring if fracture present.  She does not want pain medication ordered.  Prefers to use Ibuprofen and Tylenol as needed. Discussed placing ice to area every couple hours for twenty minutes.  Educated on deep breathing exercises to perform throughout day, while applying support to area (PNA prevention).  Return for worsening or continued issues, if fx will have return over next couple weeks.

## 2018-12-10 ENCOUNTER — Other Ambulatory Visit: Payer: Self-pay

## 2018-12-10 ENCOUNTER — Encounter: Payer: Self-pay | Admitting: *Deleted

## 2018-12-10 NOTE — Anesthesia Preprocedure Evaluation (Addendum)
Anesthesia Evaluation  Patient identified by MRN, date of birth, ID band Patient awake    Reviewed: Allergy & Precautions, NPO status , Patient's Chart, lab work & pertinent test results  History of Anesthesia Complications (+) PONV and history of anesthetic complications  Airway Mallampati: II  TM Distance: >3 FB Neck ROM: Limited    Dental  (+) Upper Dentures   Pulmonary former smoker,    breath sounds clear to auscultation       Cardiovascular hypertension, (-) angina(-) DOE  Rhythm:Regular Rate:Normal   HLD   Neuro/Psych  Headaches, PSYCHIATRIC DISORDERS Anxiety Depression    GI/Hepatic GERD  Medicated and Controlled, Dysphagia   Endo/Other    Renal/GU      Musculoskeletal   Abdominal   Peds  Hematology   Anesthesia Other Findings   Reproductive/Obstetrics                            Anesthesia Physical Anesthesia Plan  ASA: II  Anesthesia Plan: General   Post-op Pain Management:    Induction: Intravenous  PONV Risk Score and Plan: 3 and Propofol infusion, TIVA, Ondansetron and Treatment may vary due to age or medical condition  Airway Management Planned: Natural Airway and Nasal Cannula  Additional Equipment:   Intra-op Plan:   Post-operative Plan:   Informed Consent: I have reviewed the patients History and Physical, chart, labs and discussed the procedure including the risks, benefits and alternatives for the proposed anesthesia with the patient or authorized representative who has indicated his/her understanding and acceptance.       Plan Discussed with: CRNA and Anesthesiologist  Anesthesia Plan Comments:         Anesthesia Quick Evaluation

## 2018-12-11 NOTE — Discharge Instructions (Signed)

## 2018-12-14 ENCOUNTER — Other Ambulatory Visit
Admission: RE | Admit: 2018-12-14 | Discharge: 2018-12-14 | Disposition: A | Discharge status: Home / Self Care | Payer: Medicare Other | Source: Ambulatory Visit | Attending: Gastroenterology | Admitting: Gastroenterology

## 2018-12-14 DIAGNOSIS — Z01812 Encounter for preprocedural laboratory examination: Secondary | ICD-10-CM | POA: Insufficient documentation

## 2018-12-14 DIAGNOSIS — Z20828 Contact with and (suspected) exposure to other viral communicable diseases: Secondary | ICD-10-CM | POA: Diagnosis not present

## 2018-12-14 LAB — SARS CORONAVIRUS 2 (TAT 6-24 HRS): SARS Coronavirus 2: NEGATIVE

## 2018-12-17 ENCOUNTER — Ambulatory Visit: Payer: Medicare Other | Admitting: Anesthesiology

## 2018-12-17 ENCOUNTER — Encounter
Admission: RE | Disposition: A | Discharge status: Home / Self Care | Payer: Self-pay | Source: Home / Self Care | Attending: Gastroenterology

## 2018-12-17 ENCOUNTER — Other Ambulatory Visit: Payer: Self-pay

## 2018-12-17 ENCOUNTER — Ambulatory Visit
Admission: RE | Admit: 2018-12-17 | Discharge: 2018-12-17 | Disposition: A | Discharge status: Home / Self Care | Payer: Medicare Other | Attending: Gastroenterology | Admitting: Gastroenterology

## 2018-12-17 DIAGNOSIS — F329 Major depressive disorder, single episode, unspecified: Secondary | ICD-10-CM | POA: Insufficient documentation

## 2018-12-17 DIAGNOSIS — R1310 Dysphagia, unspecified: Secondary | ICD-10-CM

## 2018-12-17 DIAGNOSIS — F419 Anxiety disorder, unspecified: Secondary | ICD-10-CM | POA: Insufficient documentation

## 2018-12-17 DIAGNOSIS — I10 Essential (primary) hypertension: Secondary | ICD-10-CM | POA: Insufficient documentation

## 2018-12-17 DIAGNOSIS — K228 Other specified diseases of esophagus: Secondary | ICD-10-CM | POA: Insufficient documentation

## 2018-12-17 DIAGNOSIS — Z87891 Personal history of nicotine dependence: Secondary | ICD-10-CM | POA: Insufficient documentation

## 2018-12-17 DIAGNOSIS — K219 Gastro-esophageal reflux disease without esophagitis: Secondary | ICD-10-CM | POA: Diagnosis not present

## 2018-12-17 DIAGNOSIS — Z79899 Other long term (current) drug therapy: Secondary | ICD-10-CM | POA: Insufficient documentation

## 2018-12-17 DIAGNOSIS — E785 Hyperlipidemia, unspecified: Secondary | ICD-10-CM | POA: Insufficient documentation

## 2018-12-17 HISTORY — PX: ESOPHAGOGASTRODUODENOSCOPY (EGD) WITH PROPOFOL: SHX5813

## 2018-12-17 SURGERY — ESOPHAGOGASTRODUODENOSCOPY (EGD) WITH PROPOFOL
Anesthesia: General | Site: Mouth

## 2018-12-17 MED ORDER — PROPOFOL 10 MG/ML IV BOLUS
INTRAVENOUS | Status: DC | PRN
  Administered 2018-12-17: 120 mg via INTRAVENOUS
  Administered 2018-12-17 (×2): 40 mg via INTRAVENOUS

## 2018-12-17 MED ORDER — ACETAMINOPHEN 10 MG/ML IV SOLN: 1000.0000 mg | Freq: Once | INTRAVENOUS | Status: DC | PRN

## 2018-12-17 MED ORDER — LIDOCAINE HCL (CARDIAC) PF 100 MG/5ML IV SOSY
PREFILLED_SYRINGE | INTRAVENOUS | Status: DC | PRN
  Administered 2018-12-17: 30 mg via INTRAVENOUS

## 2018-12-17 MED ORDER — LACTATED RINGERS IV SOLN
100.0000 mL/h | INTRAVENOUS | Status: DC
  Administered 2018-12-17: 100 mL/h via INTRAVENOUS

## 2018-12-17 MED ORDER — ONDANSETRON HCL 4 MG/2ML IJ SOLN: 4.0000 mg | Freq: Once | INTRAMUSCULAR | Status: DC | PRN

## 2018-12-17 MED ORDER — GLYCOPYRROLATE 0.2 MG/ML IJ SOLN
INTRAMUSCULAR | Status: DC | PRN
  Administered 2018-12-17: 0.1 mg via INTRAVENOUS

## 2018-12-17 SURGICAL SUPPLY — 9 items
BALLN DILATOR 15-18 8 (BALLOONS) ×3
BALLOON DILATOR 15-18 8 (BALLOONS) ×1 IMPLANT
BLOCK BITE 60FR ADLT L/F GRN (MISCELLANEOUS) ×3 IMPLANT
CANISTER SUCT 1200ML W/VALVE (MISCELLANEOUS) ×3 IMPLANT
GOWN CVR UNV OPN BCK APRN NK (MISCELLANEOUS) ×2 IMPLANT
GOWN ISOL THUMB LOOP REG UNIV (MISCELLANEOUS) ×4
KIT ENDO PROCEDURE OLY (KITS) ×3 IMPLANT
SYR INFLATION 60ML (SYRINGE) ×3 IMPLANT
WATER STERILE IRR 250ML POUR (IV SOLUTION) ×3 IMPLANT

## 2018-12-17 NOTE — H&P (Signed)
Lucilla Lame, MD [facility] [address] [place], [place] [address] Phone:[phone] Fax : [phone]  Primary Care Physician:  Venita Lick, NP Primary Gastroenterologist:  Dr. Allen Norris  Pre-Procedure History & Physical: HPI:  Tabitha Larson is a 68 y.o. female is here for an endoscopy.   Past Medical History:  Diagnosis Date  . Ankle fracture   . Anxiety   . Depression   . GERD (gastroesophageal reflux disease)   . Hyperlipidemia   . Hypertension   . Migraine   . Osteoporosis   . PONV (postoperative nausea and vomiting)    after ankle repair   . Wears dentures    full upper and lower    Past Surgical History:  Procedure Laterality Date  . ANKLE FRACTURE SURGERY    . CATARACT EXTRACTION Right 01/2014  . CATARACT EXTRACTION W/PHACO Left 03/26/2016   Procedure: CATARACT EXTRACTION PHACO AND INTRAOCULAR LENS PLACEMENT ([place])  left;  Surgeon: Eulogio Bear, MD;  Location: [place];  Service: Ophthalmology;  Laterality: Left;  . COLONOSCOPY  02/04/2014  . COLONOSCOPY WITH PROPOFOL N/A 07/28/2015   Procedure: COLONOSCOPY WITH PROPOFOL;  Surgeon: Lucilla Lame, MD;  Location: [place];  Service: Endoscopy;  Laterality: N/A;  No anesthesia  . MANDIBLE SURGERY    . POLYPECTOMY  07/28/2015   Procedure: POLYPECTOMY;  Surgeon: Lucilla Lame, MD;  Location: [place];  Service: Endoscopy;;    Prior to Admission medications   Medication Sig Start Date End Date Taking? Authorizing Provider  amLODipine (NORVASC) 5 MG tablet Take 1 tablet by mouth once daily 11/12/18  Yes Cannady, Jolene T, NP  atorvastatin (LIPITOR) 40 MG tablet Take 1 tablet by mouth once daily 11/12/18  Yes Cannady, Jolene T, NP  DULoxetine (CYMBALTA) 60 MG capsule Take 1 capsule by mouth once daily 10/01/18  Yes Cannady, Jolene T, NP  hydrochlorothiazide (HYDRODIURIL) 25 MG tablet Take 1 tablet by mouth once daily 11/12/18  Yes Cannady, Jolene T, NP  pantoprazole (PROTONIX) 20  MG tablet Take 1 tablet (20 mg total) by mouth daily. 10/14/18  Yes Marnee Guarneri T, NP    Allergies as of 11/16/2018  . (No Known Allergies)    Family History  Problem Relation Age of Onset  . Stroke Mother   . Prostate cancer Father   . Colon cancer Sister   . Hypertension Sister   . Hyperlipidemia Sister   . Breast cancer Neg Hx     Social History   Socioeconomic History  . Marital status: Single    Spouse name: Not on file  . Number of children: Not on file  . Years of education: Not on file  . Highest education level: High school graduate  Occupational History  . Occupation: retired  Scientific laboratory technician  . Financial resource strain: Not hard at all  . Food insecurity    Worry: Never true    Inability: Never true  . Transportation needs    Medical: No    Non-medical: No  Tobacco Use  . Smoking status: Former Smoker    Types: Cigarettes    Quit date: 01/05/1984    Years since quitting: 34.9  . Smokeless tobacco: Never Used  Substance and Sexual Activity  . Alcohol use: No    Alcohol/week: 0.0 standard drinks  . Drug use: No  . Sexual activity: Not Currently    Birth control/protection: Post-menopausal  Lifestyle  . Physical activity    Days per week: 0 days  Minutes per session: 0 min  . Stress: Not at all  Relationships  . Social Herbalist on phone: Never    Gets together: Twice a week    Attends religious service: More than 4 times per year    Active member of club or organization: No    Attends meetings of clubs or organizations: Never    Relationship status: Divorced  . Intimate partner violence    Fear of current or ex partner: No    Emotionally abused: No    Physically abused: No    Forced sexual activity: No  Other Topics Concern  . Not on file  Social History Narrative   Niece lives in area   No kids   Has friends close by    Review of Systems: See HPI, otherwise negative ROS  Physical Exam: BP 124/73   Pulse 65   Temp  (!) 97.5 F (36.4 C) (Temporal)   Resp 16   Ht 5\' 2"  (1.575 m)   Wt 63 kg   LMP  (LMP Unknown)   SpO2 99%   BMI 25.42 kg/m  General:   Alert,  pleasant and cooperative in NAD Head:  Normocephalic and atraumatic. Neck:  Supple; no masses or thyromegaly. Lungs:  Clear throughout to auscultation.    Heart:  Regular rate and rhythm. Abdomen:  Soft, nontender and nondistended. Normal bowel sounds, without guarding, and without rebound.   Neurologic:  Alert and  oriented x4;  grossly normal neurologically.  Impression/Plan: Tabitha Larson is here for an endoscopy to be performed for dysphagia  Risks, benefits, limitations, and alternatives regarding  endoscopy have been reviewed with the patient.  Questions have been answered.  All parties agreeable.   Lucilla Lame, MD  12/17/2018, 10:31 AM

## 2018-12-17 NOTE — Anesthesia Postprocedure Evaluation (Signed)
Anesthesia Post Note  Patient: Tabitha Larson  Procedure(s) Performed: ESOPHAGOGASTRODUODENOSCOPY (EGD) WITH Dilation (N/A Mouth)  Patient location during evaluation: PACU Anesthesia Type: General Level of consciousness: awake and alert Pain management: pain level controlled Vital Signs Assessment: post-procedure vital signs reviewed and stable Respiratory status: spontaneous breathing, nonlabored ventilation, respiratory function stable and patient connected to nasal cannula oxygen Cardiovascular status: blood pressure returned to baseline and stable Postop Assessment: no apparent nausea or vomiting Anesthetic complications: no    Jaziah Kwasnik A  Aylan Bayona

## 2018-12-17 NOTE — Anesthesia Procedure Notes (Signed)
Date/Time: 12/17/2018 11:08 AM Performed by: Cameron Ali, CRNA Pre-anesthesia Checklist: Patient identified, Emergency Drugs available, Suction available, Timeout performed and Patient being monitored Patient Re-evaluated:Patient Re-evaluated prior to induction Oxygen Delivery Method: Nasal cannula Placement Confirmation: positive ETCO2

## 2018-12-17 NOTE — Transfer of Care (Signed)
Immediate Anesthesia Transfer of Care Note  Patient: Tabitha Larson  Procedure(s) Performed: ESOPHAGOGASTRODUODENOSCOPY (EGD) WITH Dilation (N/A Mouth)  Patient Location: PACU  Anesthesia Type: General  Level of Consciousness: awake, alert  and patient cooperative  Airway and Oxygen Therapy: Patient Spontanous Breathing and Patient connected to supplemental oxygen  Post-op Assessment: Post-op Vital signs reviewed, Patient's Cardiovascular Status Stable, Respiratory Function Stable, Patent Airway and No signs of Nausea or vomiting  Post-op Vital Signs: Reviewed and stable  Complications: No apparent anesthesia complications

## 2018-12-17 NOTE — Op Note (Signed)
[facility] Patient Name: Tabitha Larson Procedure Date: 12/17/2018 11:01 AM MRN: [id number] Account #: [id number] Date of Birth: 17-Sep-1950 Admit Type: Outpatient Age: 68 Room: [facility] OR ROOM 01 Gender: Female Note Status: Finalized Procedure:            Upper GI endoscopy Indications:          Dysphagia Providers:            Lucilla Lame MD, MD Referring MD:         Guadalupe Maple, MD (Referring MD) Medicines:            Propofol per Anesthesia Complications:        No immediate complications. Procedure:            Pre-Anesthesia Assessment:                       - Prior to the procedure, a History and Physical was                        performed, and patient medications and allergies were                        reviewed. The patient's tolerance of previous                        anesthesia was also reviewed. The risks and benefits of                        the procedure and the sedation options and risks were                        discussed with the patient. All questions were                        answered, and informed consent was obtained. Prior                        Anticoagulants: The patient has taken no previous                        anticoagulant or antiplatelet agents. ASA Grade                        Assessment: II - A patient with mild systemic disease.                        After reviewing the risks and benefits, the patient was                        deemed in satisfactory condition to undergo the                        procedure.                       After obtaining informed consent, the endoscope was                        passed under direct vision. Throughout the procedure,  the patient's blood pressure, pulse, and oxygen                        saturations were monitored continuously. The Endoscope                        was introduced through the mouth, and advanced to the   second part of duodenum. The upper GI endoscopy was                        accomplished without difficulty. The patient tolerated                        the procedure well. Findings:      The Z-line was irregular and was found at the gastroesophageal junction.      The stomach was normal.      The examined duodenum was normal.      A TTS dilator was passed through the scope. Dilation with a 15-16.5-18       mm balloon dilator was performed to 18 mm in the entire esophagus. Impression:           - Z-line irregular, at the gastroesophageal junction.                       - Normal stomach.                       - Normal examined duodenum.                       - Dilation performed in the entire esophagus.                       - No specimens collected. Recommendation:       - Discharge patient to home.                       - Resume previous diet.                       - Continue present medications. Procedure Code(s):    --- Professional ---                       [phone], Esophagogastroduodenoscopy, flexible, transoral;                        with transendoscopic balloon dilation of esophagus                        (less than 30 mm diameter) Diagnosis Code(s):    --- Professional ---                       R13.10, Dysphagia, unspecified CPT copyright 2019 [employer] Medical [employer]. All rights reserved. The codes documented in this report are preliminary and upon coder review may  be revised to meet current compliance requirements. Lucilla Lame MD, MD 12/17/2018 11:20:30 AM This report has been signed electronically. Number of Addenda: 0 Note Initiated On: 12/17/2018 11:01 AM Total Procedure Duration: 0 hours 5 minutes 52 seconds  Estimated Blood Loss: Estimated blood loss: none.      [facility]

## 2018-12-18 ENCOUNTER — Encounter: Payer: Self-pay | Admitting: Gastroenterology

## 2019-01-11 ENCOUNTER — Emergency Department
Admission: EM | Admit: 2019-01-11 | Discharge: 2019-01-11 | Disposition: A | Discharge status: Home / Self Care | Payer: Medicare Other | Attending: Emergency Medicine | Admitting: Emergency Medicine

## 2019-01-11 ENCOUNTER — Emergency Department: Payer: Medicare Other

## 2019-01-11 ENCOUNTER — Other Ambulatory Visit: Payer: Self-pay

## 2019-01-11 ENCOUNTER — Encounter: Payer: Self-pay | Admitting: Intensive Care

## 2019-01-11 DIAGNOSIS — S92335A Nondisplaced fracture of third metatarsal bone, left foot, initial encounter for closed fracture: Secondary | ICD-10-CM | POA: Insufficient documentation

## 2019-01-11 DIAGNOSIS — Z87891 Personal history of nicotine dependence: Secondary | ICD-10-CM | POA: Insufficient documentation

## 2019-01-11 DIAGNOSIS — X509XXA Other and unspecified overexertion or strenuous movements or postures, initial encounter: Secondary | ICD-10-CM | POA: Diagnosis not present

## 2019-01-11 DIAGNOSIS — Y999 Unspecified external cause status: Secondary | ICD-10-CM | POA: Diagnosis not present

## 2019-01-11 DIAGNOSIS — S99922A Unspecified injury of left foot, initial encounter: Secondary | ICD-10-CM | POA: Diagnosis present

## 2019-01-11 DIAGNOSIS — E782 Mixed hyperlipidemia: Secondary | ICD-10-CM | POA: Insufficient documentation

## 2019-01-11 DIAGNOSIS — Y9389 Activity, other specified: Secondary | ICD-10-CM | POA: Insufficient documentation

## 2019-01-11 DIAGNOSIS — Y92003 Bedroom of unspecified non-institutional (private) residence as the place of occurrence of the external cause: Secondary | ICD-10-CM | POA: Insufficient documentation

## 2019-01-11 DIAGNOSIS — Z79899 Other long term (current) drug therapy: Secondary | ICD-10-CM | POA: Diagnosis not present

## 2019-01-11 DIAGNOSIS — S92345A Nondisplaced fracture of fourth metatarsal bone, left foot, initial encounter for closed fracture: Secondary | ICD-10-CM | POA: Diagnosis not present

## 2019-01-11 DIAGNOSIS — S92302A Fracture of unspecified metatarsal bone(s), left foot, initial encounter for closed fracture: Secondary | ICD-10-CM

## 2019-01-11 DIAGNOSIS — I10 Essential (primary) hypertension: Secondary | ICD-10-CM | POA: Diagnosis not present

## 2019-01-11 MED ORDER — TRAMADOL HCL 50 MG PO TABS: 50.0000 mg | ORAL_TABLET | Freq: Four times a day (QID) | ORAL | 0 refills | Status: DC | PRN

## 2019-01-11 NOTE — ED Triage Notes (Signed)
Patient accidentally locked herself in her room so she jumped out her window and twisted her left ankle. Swelling noted. Unable to put all weight on foot

## 2019-01-11 NOTE — ED Provider Notes (Signed)
[facility] Emergency Department Provider Note  Time seen: 6:46 PM  I have reviewed the triage vital signs and the nursing notes.   HISTORY  Chief Complaint Ankle Pain (left)   HPI Tabitha Larson is a 68 y.o. female with a past medical history of hypertension, hyperlipidemia, gastric reflux, anxiety, presents to the emergency department for left foot pain.  According to the patient she has a finicky doorknob on her bedroom door that if it starts all the way sometimes it locks and does not let her out of the bedroom.  Patient states she needed to get out so she opened a window which is located on the first floor and she stepped out of the window but landed very hard on her left foot.  Patient states pain and swelling to the left foot since came to the emergency department.  Denies falling or hitting her head or passing out.  Denies any fever cough or shortness of breath.   Past Medical History:  Diagnosis Date  . Ankle fracture   . Anxiety   . Depression   . GERD (gastroesophageal reflux disease)   . Hyperlipidemia   . Hypertension   . Migraine   . Osteoporosis   . PONV (postoperative nausea and vomiting)    after ankle repair   . Wears dentures    full upper and lower    Patient Active Problem List   Diagnosis Date Noted  . Problems with swallowing and mastication   . Rib pain on left side 12/08/2018  . Skin lesion of back 11/11/2018  . Dysphagia 10/14/2018  . Advanced care planning/counseling discussion 05/06/2016  . Benign neoplasm of transverse colon   . Benign neoplasm of cecum   . Major depression 06/30/2014  . Benign hypertension 06/30/2014  . Hypercholesterolemia 06/30/2014  . Headache, migraine 06/30/2014  . Osteoporosis 06/30/2014    Past Surgical History:  Procedure Laterality Date  . ANKLE FRACTURE SURGERY    . CATARACT EXTRACTION Right 01/2014  . CATARACT EXTRACTION W/PHACO Left 03/26/2016   Procedure: CATARACT EXTRACTION PHACO  AND INTRAOCULAR LENS PLACEMENT ([place])  left;  Surgeon: Eulogio Bear, MD;  Location: [place];  Service: Ophthalmology;  Laterality: Left;  . COLONOSCOPY  02/04/2014  . COLONOSCOPY WITH PROPOFOL N/A 07/28/2015   Procedure: COLONOSCOPY WITH PROPOFOL;  Surgeon: Lucilla Lame, MD;  Location: [place];  Service: Endoscopy;  Laterality: N/A;  No anesthesia  . ESOPHAGOGASTRODUODENOSCOPY (EGD) WITH PROPOFOL N/A 12/17/2018   Procedure: ESOPHAGOGASTRODUODENOSCOPY (EGD) WITH Dilation;  Surgeon: Lucilla Lame, MD;  Location: [place];  Service: Endoscopy;  Laterality: N/A;  requests mid-morning arrival  . MANDIBLE SURGERY    . POLYPECTOMY  07/28/2015   Procedure: POLYPECTOMY;  Surgeon: Lucilla Lame, MD;  Location: [place];  Service: Endoscopy;;    Prior to Admission medications   Medication Sig Start Date End Date Taking? Authorizing Provider  amLODipine (NORVASC) 5 MG tablet Take 1 tablet by mouth once daily 11/12/18   Cannady, Henrine Screws T, NP  atorvastatin (LIPITOR) 40 MG tablet Take 1 tablet by mouth once daily 11/12/18   Cannady, Henrine Screws T, NP  DULoxetine (CYMBALTA) 60 MG capsule Take 1 capsule by mouth once daily 10/01/18   Cannady, Jolene T, NP  hydrochlorothiazide (HYDRODIURIL) 25 MG tablet Take 1 tablet by mouth once daily 11/12/18   Cannady, Jolene T, NP  pantoprazole (PROTONIX) 20 MG tablet Take 1 tablet (20 mg total) by mouth daily. 10/14/18   Marnee Guarneri  T, NP    No Known Allergies  Family History  Problem Relation Age of Onset  . Stroke Mother   . Prostate cancer Father   . Colon cancer Sister   . Hypertension Sister   . Hyperlipidemia Sister   . Breast cancer Neg Hx     Social History Social History   Tobacco Use  . Smoking status: Former Smoker    Types: Cigarettes    Quit date: 01/05/1984    Years since quitting: 35.0  . Smokeless tobacco: Never Used  Substance Use Topics  . Alcohol use: No    Alcohol/week: 0.0 standard drinks  .  Drug use: No    Review of Systems Constitutional: Negative for fever. Cardiovascular: Negative for chest pain. Respiratory: Negative for shortness of breath. Gastrointestinal: Negative for abdominal pain Musculoskeletal: Left foot pain moderate dull aching. Neurological: Negative for headache All other ROS negative  ____________________________________________   PHYSICAL EXAM:  VITAL SIGNS: ED Triage Vitals  Enc Vitals Group     BP 01/11/19 1808 135/79     Pulse Rate 01/11/19 1808 72     Resp 01/11/19 1808 16     Temp 01/11/19 1808 98.5 F (36.9 C)     Temp Source 01/11/19 1808 Oral     SpO2 01/11/19 1808 99 %     Weight 01/11/19 1810 140 lb (63.5 kg)     Height 01/11/19 1810 5\' 2"  (1.575 m)     Head Circumference --      Peak Flow --      Pain Score 01/11/19 1809 2     Pain Loc --      Pain Edu? --      Excl. in [place]? --     Constitutional: Alert and oriented. Well appearing and in no distress. Eyes: Normal exam ENT      Head: Normocephalic and atraumatic.      Mouth/Throat: Mucous membranes are moist. Cardiovascular: Normal rate, regular rhythm. Respiratory: Normal respiratory effort without tachypnea nor retractions. Breath sounds are clear  Gastrointestinal: Soft and nontender. No distention.   Musculoskeletal: Moderate tenderness to palpation of the dorsal aspect of the left foot with mild swelling over the metatarsals of the midfoot.  Appears to be neurovascular intact distally. Neurologic:  Normal speech and language. No gross focal neurologic deficits  Skin:  Skin is warm, dry and intact.  Psychiatric: Mood and affect are normal.   ____________________________________________   RADIOLOGY  Foot shows acute fractures of the third and fourth metatarsals, Lisfranc joint appears maintained.  ____________________________________________   INITIAL IMPRESSION / ASSESSMENT AND PLAN / ED COURSE  Pertinent labs & imaging results that were available during my  care of the patient were reviewed by me and considered in my medical decision making (see chart for details).   Patient presents emergency department for left foot pain after jumping out of a first floor window.  Patient has moderate tenderness to palpation mild swelling to this area.  I reviewed the patient's x-rays she appears to have fractures at the base of her third and fourth metatarsals.  Currently awaiting radiology reads to confirm.  If this is the only finding anticipate likely discharge home with a postoperative shoe crutches if needed and orthopedic follow-up.  Fractures of the third and fourth proximal metatarsals.  Lisfranc joint appears maintained.  We will place the patient in a postop shoe with crutches and have the patient follow-up with orthopedics.  Patient agreeable to plan of care  Tabitha Half  A Larson was evaluated in Emergency Department on 01/11/2019 for the symptoms described in the history of present illness. She was evaluated in the context of the global COVID-19 pandemic, which necessitated consideration that the patient might be at risk for infection with the SARS-CoV-2 virus that causes COVID-19. Institutional protocols and algorithms that pertain to the evaluation of patients at risk for COVID-19 are in a state of rapid change based on information released by regulatory bodies including the CDC and federal and state organizations. These policies and algorithms were followed during the patient's care in the ED.  ____________________________________________   FINAL CLINICAL IMPRESSION(S) / ED DIAGNOSES  Metatarsal fractures   Harvest Dark, MD 01/11/19 1907

## 2019-01-11 NOTE — ED Provider Notes (Signed)
[facility] Emergency Department Provider Note  ____________________________________________   None    (approximate)   I have reviewed the triage vital signs and the nursing notes.   Patient has been triaged with a MSE exam performed by myself at a minimum. Based on symptoms and screening exam, patient may receive a more in-depth exam, labs, imaging as detailed below. Patients have been advised of this setting and exam type at the time of patient interview.    HISTORY  Chief Complaint Ankle Pain (left)    HPI Tabitha Larson is a 68 y.o. female presents to the emergency department with a complaint of left foot injury. Patient was trapped in her room when her door became stuck. Patient opened a ground floor window and hurt her L foot. Patient is having pain and swelling to the mid foot.   Patient will receive a medical screening exam as detailed below.  Based off of this exam, more in depth exam, labs, imaging will be performed as needed for complaint.  Patient care will be eventually transferred to another provider in the emergency department for final exam, diagnosis and disposition.    Past Medical History:  Diagnosis Date  . Ankle fracture   . Anxiety   . Depression   . GERD (gastroesophageal reflux disease)   . Hyperlipidemia   . Hypertension   . Migraine   . Osteoporosis   . PONV (postoperative nausea and vomiting)    after ankle repair   . Wears dentures    full upper and lower    Patient Active Problem List   Diagnosis Date Noted  . Problems with swallowing and mastication   . Rib pain on left side 12/08/2018  . Skin lesion of back 11/11/2018  . Dysphagia 10/14/2018  . Advanced care planning/counseling discussion 05/06/2016  . Benign neoplasm of transverse colon   . Benign neoplasm of cecum   . Major depression 06/30/2014  . Benign hypertension 06/30/2014  . Hypercholesterolemia 06/30/2014  . Headache, migraine 06/30/2014  .  Osteoporosis 06/30/2014    Past Surgical History:  Procedure Laterality Date  . ANKLE FRACTURE SURGERY    . CATARACT EXTRACTION Right 01/2014  . CATARACT EXTRACTION W/PHACO Left 03/26/2016   Procedure: CATARACT EXTRACTION PHACO AND INTRAOCULAR LENS PLACEMENT ([place])  left;  Surgeon: Eulogio Bear, MD;  Location: [place];  Service: Ophthalmology;  Laterality: Left;  . COLONOSCOPY  02/04/2014  . COLONOSCOPY WITH PROPOFOL N/A 07/28/2015   Procedure: COLONOSCOPY WITH PROPOFOL;  Surgeon: Lucilla Lame, MD;  Location: [place];  Service: Endoscopy;  Laterality: N/A;  No anesthesia  . ESOPHAGOGASTRODUODENOSCOPY (EGD) WITH PROPOFOL N/A 12/17/2018   Procedure: ESOPHAGOGASTRODUODENOSCOPY (EGD) WITH Dilation;  Surgeon: Lucilla Lame, MD;  Location: [place];  Service: Endoscopy;  Laterality: N/A;  requests mid-morning arrival  . MANDIBLE SURGERY    . POLYPECTOMY  07/28/2015   Procedure: POLYPECTOMY;  Surgeon: Lucilla Lame, MD;  Location: [place];  Service: Endoscopy;;    Prior to Admission medications   Medication Sig Start Date End Date Taking? Authorizing Provider  amLODipine (NORVASC) 5 MG tablet Take 1 tablet by mouth once daily 11/12/18   Cannady, Henrine Screws T, NP  atorvastatin (LIPITOR) 40 MG tablet Take 1 tablet by mouth once daily 11/12/18   Cannady, Henrine Screws T, NP  DULoxetine (CYMBALTA) 60 MG capsule Take 1 capsule by mouth once daily 10/01/18   Cannady, Jolene T, NP  hydrochlorothiazide (HYDRODIURIL) 25 MG tablet Take 1 tablet  by mouth once daily 11/12/18   Cannady, Henrine Screws T, NP  pantoprazole (PROTONIX) 20 MG tablet Take 1 tablet (20 mg total) by mouth daily. 10/14/18   Venita Lick, NP    Allergies Patient has no known allergies.  Family History  Problem Relation Age of Onset  . Stroke Mother   . Prostate cancer Father   . Colon cancer Sister   . Hypertension Sister   . Hyperlipidemia Sister   . Breast cancer Neg Hx     Social History  Social History   Tobacco Use  . Smoking status: Former Smoker    Types: Cigarettes    Quit date: 01/05/1984    Years since quitting: 35.0  . Smokeless tobacco: Never Used  Substance Use Topics  . Alcohol use: No    Alcohol/week: 0.0 standard drinks  . Drug use: No    Review of Systems Constitutional: no fever ENT: no nasal congestion/rhinorhea. no sore throat Cardiovascular: no chest pain. Respiratory: no cough. no shortness of breath/difficulty breathing Gastroenterology: no abdominal pain Musculoskeletal: positive L foot injury Integumentary: Negative for rash. Neurological: No focal weakness nor numbness.   ____________________________________________   PHYSICAL EXAM:  VITAL SIGNS: ED Triage Vitals  Enc Vitals Group     BP      Pulse      Resp      Temp      Temp src      SpO2      Weight      Height      Head Circumference      Peak Flow      Pain Score      Pain Loc      Pain Edu?      Excl. in [place]?     Constitutional: Alert and oriented. Generally well appearing and in no acute distress. Eyes: Conjunctivae are normal.  Nose: No significant congestion/rhinnorhea. Mouth: No gross oropharyngeal edema. no erythema/edema Neck: No stridor.  No meningeal signs.   Cardiovascular: Grossly normal heart sounds. Respiratory: Normal respiratory effort without significant tachypnea and no observed retractions. Lungs CTAB Gastrointestinal: No significant visible abdominal wall findings.  Bowel sounds x4 quadrants. no tenderness to palpation. Musculoskeletal: mild edema L mid foot. TTP over head of 2, 3, 4 metatarsals. Dorsalis pedis pulse intact. FROM ankle and all digits. Sensation slightly decreased distal to injury. Patient still has sensation all digits however. Capillary refill less than 2 seconds Neurologic:  Normal speech and language. No gross focal neurologic deficits are appreciated.  Skin:  Skin is warm, dry and intact. No rash noted.     ____________________________________________   LABS (all labs ordered are listed, but only abnormal results are displayed)  Labs Reviewed - No data to display  ____________________________________________   RADIOLOGY   Official radiology report(s): No results found.  ____________________________________________    INITIAL IMPRESSION / MDM / ASSESSMENT AND PLAN / ED COURSE  As part of my medical decision making, I reviewed the following data within the electronic MEDICAL RECORD NUMBER Notes from prior ED visits      Clinical Impression: Foot injury   Plan: xrays of ankle and foot  Patient has been screened based based on their arrival complaint, evaluated for an emergent condition, and at a minimum has received a medical screening exam.  At this time, patient will receive the further work-up listed above that was determined by medical screening exam.  Patient care will be transferred to another provider in the emergency department  once patient is roomed for final diagnosis and disposition.    ____________________________________________  Note:  This document was prepared using Systems analyst and may include unintentional dictation errors.    Darletta Moll, PA-C 01/11/19 1818    Earleen Newport, MD 01/13/19 1048

## 2019-01-11 NOTE — Discharge Instructions (Signed)
Please use your crutches and wear your shoe.  Please call the number provided for orthopedics to arrange a follow-up appointment within the next several days for recheck/reevaluation.

## 2019-01-27 ENCOUNTER — Other Ambulatory Visit: Payer: Self-pay | Admitting: Nurse Practitioner

## 2019-02-27 ENCOUNTER — Other Ambulatory Visit: Payer: Self-pay | Admitting: Nurse Practitioner

## 2019-02-27 NOTE — Telephone Encounter (Signed)
Forwarding medication refill requests to PCP for review. 

## 2019-05-07 ENCOUNTER — Other Ambulatory Visit: Payer: Self-pay | Admitting: Nurse Practitioner

## 2019-05-11 ENCOUNTER — Ambulatory Visit: Payer: Medicare Other | Admitting: Nurse Practitioner

## 2019-05-19 ENCOUNTER — Ambulatory Visit: Payer: Medicare Other | Admitting: Nurse Practitioner

## 2019-05-19 ENCOUNTER — Ambulatory Visit: Payer: Medicare Other

## 2019-05-31 ENCOUNTER — Ambulatory Visit: Payer: Medicare PPO | Attending: Internal Medicine

## 2019-05-31 ENCOUNTER — Other Ambulatory Visit: Payer: Self-pay

## 2019-05-31 DIAGNOSIS — Z23 Encounter for immunization: Secondary | ICD-10-CM

## 2019-05-31 NOTE — Progress Notes (Signed)
   Covid-19 Vaccination Clinic  Name:  Tabitha Larson    MRN: [id number] DOB: 1950-10-29  05/31/2019  [name] was observed post Covid-19 immunization for 15 minutes without incident. She was provided with Vaccine Information Sheet and instruction to access the V-Safe system.   [name] was instructed to call 911 with any severe reactions post vaccine: Marland Kitchen Difficulty breathing  . Swelling of face and throat  . A fast heartbeat  . A bad rash all over body  . Dizziness and weakness   Immunizations Administered    Name Date Dose VIS Date Route   Pfizer COVID-19 Vaccine 05/31/2019 11:49 AM 0.3 mL 02/12/2019 Intramuscular   Manufacturer: [employer]   Lot: [id number]   [place]: [id number]

## 2019-06-14 ENCOUNTER — Encounter: Payer: Self-pay | Admitting: Nurse Practitioner

## 2019-06-14 ENCOUNTER — Ambulatory Visit (INDEPENDENT_AMBULATORY_CARE_PROVIDER_SITE_OTHER): Payer: Medicare PPO

## 2019-06-14 ENCOUNTER — Other Ambulatory Visit: Payer: Self-pay

## 2019-06-14 ENCOUNTER — Ambulatory Visit (INDEPENDENT_AMBULATORY_CARE_PROVIDER_SITE_OTHER): Payer: Medicare PPO | Admitting: Nurse Practitioner

## 2019-06-14 VITALS — BP 118/77 | HR 62 | Temp 97.7°F | Resp 15 | Ht 62.5 in | Wt 141.0 lb

## 2019-06-14 VITALS — BP 118/77 | HR 62 | Temp 97.7°F | Ht 62.5 in | Wt 141.0 lb

## 2019-06-14 DIAGNOSIS — F332 Major depressive disorder, recurrent severe without psychotic features: Secondary | ICD-10-CM | POA: Diagnosis not present

## 2019-06-14 DIAGNOSIS — R5383 Other fatigue: Secondary | ICD-10-CM | POA: Insufficient documentation

## 2019-06-14 DIAGNOSIS — E7800 Pure hypercholesterolemia, unspecified: Secondary | ICD-10-CM

## 2019-06-14 DIAGNOSIS — I10 Essential (primary) hypertension: Secondary | ICD-10-CM | POA: Diagnosis not present

## 2019-06-14 DIAGNOSIS — K219 Gastro-esophageal reflux disease without esophagitis: Secondary | ICD-10-CM

## 2019-06-14 DIAGNOSIS — Z1231 Encounter for screening mammogram for malignant neoplasm of breast: Secondary | ICD-10-CM | POA: Diagnosis not present

## 2019-06-14 DIAGNOSIS — Z0000 Encounter for general adult medical examination without abnormal findings: Secondary | ICD-10-CM | POA: Diagnosis not present

## 2019-06-14 DIAGNOSIS — R531 Weakness: Secondary | ICD-10-CM | POA: Insufficient documentation

## 2019-06-14 MED ORDER — PANTOPRAZOLE SODIUM 20 MG PO TBEC: 20.0000 mg | DELAYED_RELEASE_TABLET | Freq: Every day | ORAL | 4 refills | Status: DC

## 2019-06-14 NOTE — Patient Instructions (Signed)

## 2019-06-14 NOTE — Assessment & Plan Note (Signed)
Chronic, stable with BP at goal today.  Continue current medication regimen and adjust as needed. Recommend checking BP at home 3 mornings a week and documenting for provider.  CMP today.

## 2019-06-14 NOTE — Progress Notes (Signed)
BP 118/77   Pulse 62   Temp 97.7 F (36.5 C) (Oral)   Ht 5' 2.5" (1.588 m)   Wt 141 lb (64 kg)   LMP  (LMP Unknown)   SpO2 96%   BMI 25.38 kg/m    Subjective:    Patient ID: Tabitha Larson, female    DOB: 1950-03-09, 69 y.o.   MRN: [id number]  HPI: Tabitha Larson is a 69 y.o. female  Chief Complaint  Patient presents with  . Depression  . Gastroesophageal Reflux  . Hyperlipidemia  . Hypertension   HYPERTENSION / HYPERLIPIDEMIA Continues on Amlodipine & HCTZ and Atorvastatin. Satisfied with current treatment? yes Duration of hypertension: chronic BP monitoring frequency: not checking BP range:  BP medication side effects: no Duration of hyperlipidemia: chronic Cholesterol medication side effects: no Cholesterol supplements: none Medication compliance: good compliance Aspirin: no Recent stressors: no Recurrent headaches: no Visual changes: no Palpitations: no Dyspnea: no Chest pain: no Lower extremity edema: no Dizzy/lightheaded: no  The 10-year ASCVD risk score Mikey Bussing DC Jr., et al., 2013) is: 11.1%   Values used to calculate the score:     Age: 89 years     Sex: Female     Is Non-Hispanic African American: No     Diabetic: No     Tobacco smoker: No     Systolic Blood Pressure: 123456 mmHg     Is BP treated: Yes     HDL Cholesterol: 45 mg/dL     Total Cholesterol: 191 mg/dL   GERD Reports she stopped taking Protonix, but heart burn has become more frequent since stopping taking this and plans to restart. GERD control status: stable  Satisfied with current treatment? yes Heartburn frequency: every day without medicine, with medicine hardly ever had Medication side effects: no  Medication compliance: stable Previous GERD medications: TUMS Antacid use frequency: every day Dysphagia: no Odynophagia:  no Hematemesis: no Blood in stool: no EGD: yes, October 2020  FATIGUE Has been present for 6 months, gradually came on.  She endorses good sleep  pattern.  Has had sleep apnea test about 4-5 years ago which she reports was okay. Duration:  chronic Severity: mild -- notices it most in the afternoon, after she has been doing things for 4 hours Onset: gradual Context when symptoms started:  unknown Symptoms improve with rest: yes -- 15 minute nap and is better Depressive symptoms: at times Stress/anxiety: a little bit recently due to ill family members Insomnia: no none Snoring: no Observed apnea by bed partner: no Daytime hypersomnolence:no Wakes feeling refreshed: yes History of sleep study: yes Dysnea on exertion:  no Orthopnea/PND: no -- sleeps on two pillows at baseline Chest pain: no Chronic cough: no Lower extremity edema: no Arthralgias:no Myalgias: no Weakness: no Rash: no  DEPRESSION Continues on Duloxetine.  Reports having more good days then bad days.  Denies SI/HI.  She does report needing to go to therapy, has had some illnesses and was unable to follow up on this.  She is going to reach out to church as reports he needs are more spiritual. Mood status: stable Satisfied with current treatment?: yes Symptom severity: mild  Duration of current treatment : chronic Side effects: no Medication compliance: good compliance Psychotherapy/counseling: none Depressed mood: occasional Anxious mood: no Anhedonia: no Significant weight loss or gain: no Insomnia: none Fatigue: no Feelings of worthlessness or guilt: no Impaired concentration/indecisiveness: no Suicidal ideations: no Hopelessness: no Crying spells: no Depression screen [facility] 2/9 06/14/2019  06/14/2019 10/14/2018 07/09/2018 05/18/2018  Decreased Interest 0 0 1 1 0  Down, Depressed, Hopeless 1 1 1 2  0  PHQ - 2 Score 1 1 2 3  0  Altered sleeping 0 - 1 2 -  Tired, decreased energy 3 - 1 3 -  Change in appetite 3 - 3 3 -  Feeling bad or failure about yourself  1 - 1 2 -  Trouble concentrating 1 - 1 2 -  Moving slowly or fidgety/restless 0 - 0 0 -  Suicidal  thoughts 0 - 0 0 -  PHQ-9 Score 9 - 9 15 -  Difficult doing work/chores Not difficult at all - Not difficult at all Somewhat difficult -  Some recent data might be hidden    Relevant past medical, surgical, family and social history reviewed and updated as indicated. Interim medical history since our last visit reviewed. Allergies and medications reviewed and updated.  Review of Systems  Constitutional: Positive for fatigue. Negative for activity change, appetite change, diaphoresis and fever.  Respiratory: Negative for cough, chest tightness and shortness of breath.   Cardiovascular: Negative for chest pain, palpitations and leg swelling.  Gastrointestinal: Negative.   Endocrine: Negative for cold intolerance and heat intolerance.  Neurological: Negative.   Psychiatric/Behavioral: Positive for decreased concentration. Negative for self-injury, sleep disturbance and suicidal ideas. The patient is not nervous/anxious.     Per HPI unless specifically indicated above     Objective:    BP 118/77   Pulse 62   Temp 97.7 F (36.5 C) (Oral)   Ht 5' 2.5" (1.588 m)   Wt 141 lb (64 kg)   LMP  (LMP Unknown)   SpO2 96%   BMI 25.38 kg/m   Wt Readings from Last 3 Encounters:  06/14/19 141 lb (64 kg)  06/14/19 141 lb (64 kg)  01/11/19 140 lb (63.5 kg)    Physical Exam Vitals and nursing note reviewed.  Constitutional:      General: She is awake. She is not in acute distress.    Appearance: She is well-developed and well-groomed. She is not ill-appearing.  HENT:     Head: Normocephalic.     Right Ear: Hearing normal.     Left Ear: Hearing normal.  Eyes:     General: Lids are normal.        Right eye: No discharge.        Left eye: No discharge.     Conjunctiva/sclera: Conjunctivae normal.     Pupils: Pupils are equal, round, and reactive to light.  Neck:     Thyroid: No thyromegaly.     Vascular: No carotid bruit.  Cardiovascular:     Rate and Rhythm: Normal rate and regular  rhythm.     Heart sounds: Normal heart sounds. No murmur. No gallop.   Pulmonary:     Effort: Pulmonary effort is normal. No accessory muscle usage or respiratory distress.     Breath sounds: Normal breath sounds.  Abdominal:     General: Bowel sounds are normal.     Palpations: Abdomen is soft.  Musculoskeletal:     Cervical back: Normal range of motion and neck supple.     Right lower leg: No edema.     Left lower leg: No edema.  Lymphadenopathy:     Cervical: No cervical adenopathy.  Skin:    General: Skin is warm and dry.  Neurological:     Mental Status: She is alert and oriented to person, place, and  time.  Psychiatric:        Attention and Perception: Attention normal.        Mood and Affect: Mood normal.        Speech: Speech normal.        Behavior: Behavior normal. Behavior is cooperative.        Thought Content: Thought content normal.     Results for orders placed or performed during the hospital encounter of 12/14/18  SARS CORONAVIRUS 2 (TAT 6-24 HRS) Nasopharyngeal Nasopharyngeal Swab   Specimen: Nasopharyngeal Swab  Result Value Ref Range   SARS Coronavirus 2 NEGATIVE NEGATIVE      Assessment & Plan:   Problem List Items Addressed This Visit      Cardiovascular and Mediastinum   Benign hypertension    Chronic, stable with BP at goal today.  Continue current medication regimen and adjust as needed. Recommend checking BP at home 3 mornings a week and documenting for provider.  CMP today.      Relevant Orders   Comprehensive metabolic panel     Digestive   GERD (gastroesophageal reflux disease)    Chronic, ongoing.  She tried stopping Protonix with return of symptoms.  Recommend she restart Protonix for symptoms control, as while on medication had no reflux and did not need to take TUMS.  Refill sent in.      Relevant Medications   pantoprazole (PROTONIX) 20 MG tablet     Other   Major depression - Primary    Chronic, ongoing.  Continue current  medication regimen and adjust as needed.  Suspect some of fatigue may be related to mood at this time, recommend seeking therapy through her church for spiritual guidance.  Follow-up in 6 months.  Denies SI/HI.      Hypercholesterolemia    Chronic, ongoing.  Continue current medication regimen and adjust as needed. Lipid panel and CMP today.        Relevant Orders   Comprehensive metabolic panel   Lipid Panel w/o Chol/HDL Ratio   Fatigue    Present x 6 months with no worsening.  No red flag symptoms or weight loss noted on review.  Will obtain TSH, CMP, B12, CBC, and A1C today.  Possibly related to mood due to recent losses in family and spiritual concerns.  Recommend she seek therapy via her church and if unable to obtain here will place referral for therapy for patient.  Return in 6 weeks, sooner if worsening.      Relevant Orders   CBC with Differential/Platelet   Comprehensive metabolic panel   TSH   123456   HgB A1c       Follow up plan: Return in about 6 weeks (around 07/26/2019) for Fatigue.

## 2019-06-14 NOTE — Progress Notes (Signed)
Subjective:   Tabitha Larson is a 69 y.o. female who presents for Medicare Annual (Subsequent) preventive examination.   Review of Systems:   Cardiac Risk Factors include: advanced age (>48men, >63 women);hypertension;dyslipidemia     Objective:     Vitals: Ht 5' 2.5" (1.588 m)   Wt 141 lb (64 kg)   LMP  (LMP Unknown)   BMI 25.38 kg/m   Body mass index is 25.38 kg/m.  Advanced Directives 01/11/2019 12/17/2018 05/18/2018 05/08/2017 05/06/2016 03/26/2016 07/28/2015  Does Patient Have a Medical Advance Directive? No No - No No No No  Would patient like information on creating a medical advance directive? No - Patient declined Yes (MAU/Ambulatory/Procedural Areas - Information given) No - Patient declined Yes (MAU/Ambulatory/Procedural Areas - Information given) (No Data) Yes (MAU/Ambulatory/Procedural Areas - Information given) -    Tobacco Social History   Tobacco Use  Smoking Status Former Smoker  . Types: Cigarettes  . Quit date: 01/05/1984  . Years since quitting: 35.4  Smokeless Tobacco Never Used     Counseling given: Not Answered   Clinical Intake:                       Past Medical History:  Diagnosis Date  . Ankle fracture   . Anxiety   . Depression   . GERD (gastroesophageal reflux disease)   . Hyperlipidemia   . Hypertension   . Migraine   . Osteoporosis   . PONV (postoperative nausea and vomiting)    after ankle repair   . Wears dentures    full upper and lower   Past Surgical History:  Procedure Laterality Date  . ANKLE FRACTURE SURGERY    . CATARACT EXTRACTION Right 01/2014  . CATARACT EXTRACTION W/PHACO Left 03/26/2016   Procedure: CATARACT EXTRACTION PHACO AND INTRAOCULAR LENS PLACEMENT ([place])  left;  Surgeon: Eulogio Bear, MD;  Location: [facility];  Service: Ophthalmology;  Laterality: Left;  . COLONOSCOPY  02/04/2014  . COLONOSCOPY WITH PROPOFOL N/A 07/28/2015   Procedure: COLONOSCOPY WITH PROPOFOL;  Surgeon: Lucilla Lame, MD;  Location: [facility];  Service: Endoscopy;  Laterality: N/A;  No anesthesia  . ESOPHAGOGASTRODUODENOSCOPY (EGD) WITH PROPOFOL N/A 12/17/2018   Procedure: ESOPHAGOGASTRODUODENOSCOPY (EGD) WITH Dilation;  Surgeon: Lucilla Lame, MD;  Location: [facility];  Service: Endoscopy;  Laterality: N/A;  requests mid-morning arrival  . MANDIBLE SURGERY    . POLYPECTOMY  07/28/2015   Procedure: POLYPECTOMY;  Surgeon: Lucilla Lame, MD;  Location: [facility];  Service: Endoscopy;;   Family History  Problem Relation Age of Onset  . Stroke Mother   . Prostate cancer Father   . Colon cancer Sister   . Hypertension Sister   . Hyperlipidemia Sister   . Breast cancer Neg Hx    Social History   Socioeconomic History  . Marital status: Divorced    Spouse name: Not on file  . Number of children: Not on file  . Years of education: Not on file  . Highest education level: High school graduate  Occupational History  . Occupation: retired  Tobacco Use  . Smoking status: Former Smoker    Types: Cigarettes    Quit date: 01/05/1984    Years since quitting: 35.4  . Smokeless tobacco: Never Used  Substance and Sexual Activity  . Alcohol use: No    Alcohol/week: 0.0 standard drinks  . Drug use: No  . Sexual activity: Not Currently    Birth control/protection:  Post-menopausal  Other Topics Concern  . Not on file  Social History Narrative   Niece lives in area   No kids   Has friends close by   [employer] live with her currently    Social Determinants of Health   Financial Resource Strain:   . Difficulty of Paying Living Expenses:   Food Insecurity:   . Worried About Charity fundraiser in the Last Year:   . Arboriculturist in the Last Year:   Transportation Needs:   . Film/video editor (Medical):   Marland Kitchen Lack of Transportation (Non-Medical):   Physical Activity:   . Days of Exercise per Week:   . Minutes of Exercise per Session:   Stress:   . Feeling of Stress  :   Social Connections: Moderately Isolated  . Frequency of Communication with Friends and Family: More than three times a week  . Frequency of Social Gatherings with Friends and Family: More than three times a week  . Attends Religious Services: Never  . Active Member of Clubs or Organizations: No  . Attends Archivist Meetings: Never  . Marital Status: Widowed    Outpatient Encounter Medications as of 06/14/2019  Medication Sig  . amLODipine (NORVASC) 5 MG tablet Take 1 tablet by mouth once daily  . atorvastatin (LIPITOR) 40 MG tablet Take 1 tablet by mouth once daily  . DULoxetine (CYMBALTA) 60 MG capsule Take 1 capsule by mouth once daily  . hydrochlorothiazide (HYDRODIURIL) 25 MG tablet Take 1 tablet by mouth once daily  . pantoprazole (PROTONIX) 20 MG tablet Take 1 tablet by mouth once daily  . traMADol (ULTRAM) 50 MG tablet Take 1 tablet (50 mg total) by mouth every 6 (six) hours as needed.   No facility-administered encounter medications on file as of 06/14/2019.    Activities of Daily Living In your present state of health, do you have any difficulty performing the following activities: 06/14/2019 12/17/2018  Hearing? N N  Vision? N N  Difficulty concentrating or making decisions? N N  Walking or climbing stairs? N N  Dressing or bathing? N N  Doing errands, shopping? N -  Preparing Food and eating ? N -  Using the Toilet? N -  In the past six months, have you accidently leaked urine? N -  Do you have problems with loss of bowel control? N -  Managing your Medications? N -  Managing your Finances? N -  Housekeeping or managing your Housekeeping? N -  Some recent data might be hidden    Patient Care Team: Venita Lick, NP as PCP - General (Nurse Practitioner) Eulogio Bear, MD as Consulting Physician (Ophthalmology)    Assessment:   This is a routine wellness examination for Tabitha Larson.  Exercise Activities and Dietary recommendations Current  Exercise Habits: The patient does not participate in regular exercise at present(outside work), Exercise limited by: None identified  Goals Addressed   None     Fall Risk: Fall Risk  06/14/2019 06/14/2019 12/08/2018 05/18/2018 01/08/2018  Falls in the past year? 0 0 1 0 0  Number falls in past yr: - 0 0 - -  Injury with Fall? - 0 0 - -  Follow up - - Falls evaluation completed - -    FALL RISK PREVENTION PERTAINING TO THE HOME:  Any stairs in or around the home? Yes  If so, are there any without handrails? No   Home free of loose throw rugs in  walkways, pet beds, electrical cords, etc? Yes  Adequate lighting in your home to reduce risk of falls? Yes   ASSISTIVE DEVICES UTILIZED TO PREVENT FALLS:  Life alert? No  Use of a cane, walker or w/c? No  Grab bars in the bathroom? No  Shower chair or bench in shower? No  Elevated toilet seat or a handicapped toilet? No   DME ORDERS:  DME order needed?  No   TIMED UP AND GO:  Was the test performed? Yes .  Length of time to ambulate 10 feet: 11 sec.   GAIT:  Appearance of gait: Gait steady and fast without the use of an assistive device.  Education: Fall risk prevention has been discussed.  Intervention(s) required? No   DME/home health order needed?  No    Depression Screen PHQ 2/9 Scores 06/14/2019 10/14/2018 07/09/2018 05/18/2018  PHQ - 2 Score 1 2 3  0  PHQ- 9 Score - 9 15 -     Cognitive Function     6CIT Screen 05/18/2018 05/08/2017  What Year? 0 points 0 points  What month? 0 points 0 points  What time? 0 points 0 points  Count back from 20 0 points 0 points  Months in reverse 0 points 0 points  Repeat phrase 0 points 0 points  Total Score 0 0    Immunization History  Administered Date(s) Administered  . DTaP 08/27/2010  . Fluad Quad(high Dose 65+) 11/11/2018  . Influenza, High Dose Seasonal PF 11/11/2016, 01/08/2018  . Influenza,inj,Quad PF,6+ Mos 01/24/2015  . Influenza-Unspecified 02/11/2014, 12/12/2015  .  PFIZER SARS-COV-2 Vaccination 05/31/2019  . Pneumococcal Conjugate-13 02/11/2014  . Pneumococcal Polysaccharide-23 05/01/2015  . Tdap 08/27/2010  . Zoster 09/11/2011    Qualifies for Shingles Vaccine? Yes  Zostavax completed 2013. Due for Shingrix. Education has been provided regarding the importance of this vaccine. Pt has been advised to call insurance company to determine out of pocket expense. Advised may also receive vaccine at local pharmacy or Health Dept. Verbalized acceptance and understanding.  Tdap: up to date   Flu Vaccine: up to date   Pneumococcal Vaccine: up to date   Covid-19 Vaccine:  Completed first dose.   Screening Tests Health Maintenance  Topic Date Due  . MAMMOGRAM  05/28/2019  . INFLUENZA VACCINE  10/03/2019  . COLONOSCOPY  07/27/2020  . TETANUS/TDAP  08/26/2020  . DEXA SCAN  Completed  . Hepatitis C Screening  Completed  . PNA vac Low Risk Adult  Completed    Cancer Screenings:  Colorectal Screening: Completed 07/28/2015. Repeat every 5 years.   Mammogram: Completed 05/27/2017. Repeat every year; ordered  Bone Density: Completed 2019.   Lung Cancer Screening: (Low Dose CT Chest recommended if Age 19-80 years, 30 pack-year currently smoking OR have quit w/in 15years.) does not qualify.     Additional Screening:  Hepatitis C Screening: does qualify; Completed 05/01/2015  Vision Screening: Recommended annual ophthalmology exams for early detection of glaucoma and other disorders of the eye. Is the patient up to date with their annual eye exam?  Yes  Who is the provider or what is the name of the office in which the pt attends annual eye exams? Dr.King   Dental Screening: Recommended annual dental exams for proper oral hygiene  Community Resource Referral:  CRR required this visit?  No       Plan:  I have personally reviewed and addressed the Medicare Annual Wellness questionnaire and have noted the following in the patient's chart:  A. Medical and social history B. Use of alcohol, tobacco or illicit drugs  C. Current medications and supplements D. Functional ability and status E.  Nutritional status F.  Physical activity G. Advance directives H. List of other physicians I.  Hospitalizations, surgeries, and ER visits in previous 12 months J.  [place] such as hearing and vision if needed, cognitive and depression L. Referrals and appointments   In addition, I have reviewed and discussed with patient certain preventive protocols, quality metrics, and best practice recommendations. A written personalized care plan for preventive services as well as general preventive health recommendations were provided to patient.  Signed,    Bevelyn Ngo, LPN  [id number] Nurse Health Advisor   Nurse Notes: none

## 2019-06-14 NOTE — Assessment & Plan Note (Addendum)
Chronic, ongoing.  Continue current medication regimen and adjust as needed.  Suspect some of fatigue may be related to mood at this time, recommend seeking therapy through her church for spiritual guidance.  Follow-up in 6 months.  Denies SI/HI.

## 2019-06-14 NOTE — Assessment & Plan Note (Signed)
Present x 6 months with no worsening.  No red flag symptoms or weight loss noted on review.  Will obtain TSH, CMP, B12, CBC, and A1C today.  Possibly related to mood due to recent losses in family and spiritual concerns.  Recommend she seek therapy via her church and if unable to obtain here will place referral for therapy for patient.  Return in 6 weeks, sooner if worsening.

## 2019-06-14 NOTE — Assessment & Plan Note (Signed)
Chronic, ongoing.  Continue current medication regimen and adjust as needed.  Lipid panel and CMP today.    

## 2019-06-14 NOTE — Patient Instructions (Signed)
Tabitha Larson , Thank you for taking time to come for your Medicare Wellness Visit. I appreciate your ongoing commitment to your health goals. Please review the following plan we discussed and let me know if I can assist you in the future.   Screening recommendations/referrals: Colonoscopy: due 2022 Mammogram: Please call [phone] to schedule your mammogram.  Bone Density: completed 2019 Recommended yearly ophthalmology/optometry visit for glaucoma screening and checkup Recommended yearly dental visit for hygiene and checkup  Vaccinations: Influenza vaccine: up to date  Pneumococcal vaccine: up to date  Tdap vaccine: up to date  Shingles vaccine: shingrix eligible    Covid-19: first dose completed   Advanced directives: Advance directive discussed with you today. I have provided a copy for you to complete at home and have notarized. Once this is complete please bring a copy in to our office so we can scan it into your chart.  Conditions/risks identified: none   Next appointment: Follow up in one year for your annual wellness visit    Preventive Care 71 Years and Older, Female Preventive care refers to lifestyle choices and visits with your health care provider that can promote health and wellness. What does preventive care include?  A yearly physical exam. This is also called an annual well check.  Dental exams once or twice a year.  Routine eye exams. Ask your health care provider how often you should have your eyes checked.  Personal lifestyle choices, including:  Daily care of your teeth and gums.  Regular physical activity.  Eating a healthy diet.  Avoiding tobacco and drug use.  Limiting alcohol use.  Practicing safe sex.  Taking low-dose aspirin every day.  Taking vitamin and mineral supplements as recommended by your health care provider. What happens during an annual well check? The services and screenings done by your health care provider during your  annual well check will depend on your age, overall health, lifestyle risk factors, and family history of disease. Counseling  Your health care provider may ask you questions about your:  Alcohol use.  Tobacco use.  Drug use.  Emotional well-being.  Home and relationship well-being.  Sexual activity.  Eating habits.  History of falls.  Memory and ability to understand (cognition).  Work and work Statistician.  Reproductive health. Screening  You may have the following tests or measurements:  Height, weight, and BMI.  Blood pressure.  Lipid and cholesterol levels. These may be checked every 5 years, or more frequently if you are over 66 years old.  Skin check.  Lung cancer screening. You may have this screening every year starting at age 59 if you have a 30-pack-year history of smoking and currently smoke or have quit within the past 15 years.  Fecal occult blood test (FOBT) of the stool. You may have this test every year starting at age 55.  Flexible sigmoidoscopy or colonoscopy. You may have a sigmoidoscopy every 5 years or a colonoscopy every 10 years starting at age 12.  Hepatitis C blood test.  Hepatitis B blood test.  Sexually transmitted disease (STD) testing.  Diabetes screening. This is done by checking your blood sugar (glucose) after you have not eaten for a while (fasting). You may have this done every 1-3 years.  Bone density scan. This is done to screen for osteoporosis. You may have this done starting at age 33.  Mammogram. This may be done every 1-2 years. Talk to your health care provider about how often you should have regular mammograms.  Talk with your health care provider about your test results, treatment options, and if necessary, the need for more tests. Vaccines  Your health care provider may recommend certain vaccines, such as:  Influenza vaccine. This is recommended every year.  Tetanus, diphtheria, and acellular pertussis (Tdap, Td)  vaccine. You may need a Td booster every 10 years.  Zoster vaccine. You may need this after age 22.  Pneumococcal 13-valent conjugate (PCV13) vaccine. One dose is recommended after age 17.  Pneumococcal polysaccharide (PPSV23) vaccine. One dose is recommended after age 9. Talk to your health care provider about which screenings and vaccines you need and how often you need them. This information is not intended to replace advice given to you by your health care provider. Make sure you discuss any questions you have with your health care provider. Document Released: 03/17/2015 Document Revised: 11/08/2015 Document Reviewed: 12/20/2014 Elsevier Interactive Patient Education  2017 [place] Prevention in the Home Falls can cause injuries. They can happen to people of all ages. There are many things you can do to make your home safe and to help prevent falls. What can I do on the outside of my home?  Regularly fix the edges of walkways and driveways and fix any cracks.  Remove anything that might make you trip as you walk through a door, such as a raised step or threshold.  Trim any bushes or trees on the path to your home.  Use bright outdoor lighting.  Clear any walking paths of anything that might make someone trip, such as rocks or tools.  Regularly check to see if handrails are loose or broken. Make sure that both sides of any steps have handrails.  Any raised decks and porches should have guardrails on the edges.  Have any leaves, snow, or ice cleared regularly.  Use sand or salt on walking paths during winter.  Clean up any spills in your garage right away. This includes oil or grease spills. What can I do in the bathroom?  Use night lights.  Install grab bars by the toilet and in the tub and shower. Do not use towel bars as grab bars.  Use non-skid mats or decals in the tub or shower.  If you need to sit down in the shower, use a plastic, non-slip  stool.  Keep the floor dry. Clean up any water that spills on the floor as soon as it happens.  Remove soap buildup in the tub or shower regularly.  Attach bath mats securely with double-sided non-slip rug tape.  Do not have throw rugs and other things on the floor that can make you trip. What can I do in the bedroom?  Use night lights.  Make sure that you have a light by your bed that is easy to reach.  Do not use any sheets or blankets that are too big for your bed. They should not hang down onto the floor.  Have a firm chair that has side arms. You can use this for support while you get dressed.  Do not have throw rugs and other things on the floor that can make you trip. What can I do in the kitchen?  Clean up any spills right away.  Avoid walking on wet floors.  Keep items that you use a lot in easy-to-reach places.  If you need to reach something above you, use a strong step stool that has a grab bar.  Keep electrical cords out of the way.  Do not use floor polish or wax that makes floors slippery. If you must use wax, use non-skid floor wax.  Do not have throw rugs and other things on the floor that can make you trip. What can I do with my stairs?  Do not leave any items on the stairs.  Make sure that there are handrails on both sides of the stairs and use them. Fix handrails that are broken or loose. Make sure that handrails are as long as the stairways.  Check any carpeting to make sure that it is firmly attached to the stairs. Fix any carpet that is loose or worn.  Avoid having throw rugs at the top or bottom of the stairs. If you do have throw rugs, attach them to the floor with carpet tape.  Make sure that you have a light switch at the top of the stairs and the bottom of the stairs. If you do not have them, ask someone to add them for you. What else can I do to help prevent falls?  Wear shoes that:  Do not have high heels.  Have rubber bottoms.  Are  comfortable and fit you well.  Are closed at the toe. Do not wear sandals.  If you use a stepladder:  Make sure that it is fully opened. Do not climb a closed stepladder.  Make sure that both sides of the stepladder are locked into place.  Ask someone to hold it for you, if possible.  Clearly mark and make sure that you can see:  Any grab bars or handrails.  First and last steps.  Where the edge of each step is.  Use tools that help you move around (mobility aids) if they are needed. These include:  Canes.  Walkers.  Scooters.  Crutches.  Turn on the lights when you go into a dark area. Replace any light bulbs as soon as they burn out.  Set up your furniture so you have a clear path. Avoid moving your furniture around.  If any of your floors are uneven, fix them.  If there are any pets around you, be aware of where they are.  Review your medicines with your doctor. Some medicines can make you feel dizzy. This can increase your chance of falling. Ask your doctor what other things that you can do to help prevent falls. This information is not intended to replace advice given to you by your health care provider. Make sure you discuss any questions you have with your health care provider. Document Released: 12/15/2008 Document Revised: 07/27/2015 Document Reviewed: 03/25/2014 Elsevier Interactive Patient Education  2017 [employer].

## 2019-06-14 NOTE — Assessment & Plan Note (Signed)
Chronic, ongoing.  She tried stopping Protonix with return of symptoms.  Recommend she restart Protonix for symptoms control, as while on medication had no reflux and did not need to take TUMS.  Refill sent in.

## 2019-06-15 ENCOUNTER — Encounter: Payer: Self-pay | Admitting: Nurse Practitioner

## 2019-06-15 DIAGNOSIS — R7309 Other abnormal glucose: Secondary | ICD-10-CM | POA: Insufficient documentation

## 2019-06-15 DIAGNOSIS — E538 Deficiency of other specified B group vitamins: Secondary | ICD-10-CM | POA: Insufficient documentation

## 2019-06-15 LAB — COMPREHENSIVE METABOLIC PANEL
ALT: 19 IU/L (ref 0–32)
AST: 18 IU/L (ref 0–40)
Albumin/Globulin Ratio: 1.9 (ref 1.2–2.2)
Albumin: 4.6 g/dL (ref 3.8–4.8)
Alkaline Phosphatase: 92 IU/L (ref 39–117)
BUN/Creatinine Ratio: 17 (ref 12–28)
BUN: 12 mg/dL (ref 8–27)
Bilirubin Total: 0.4 mg/dL (ref 0.0–1.2)
CO2: 25 mmol/L (ref 20–29)
Calcium: 10.2 mg/dL (ref 8.7–10.3)
Chloride: 98 mmol/L (ref 96–106)
Creatinine, Ser: 0.69 mg/dL (ref 0.57–1.00)
GFR calc Af Amer: 102 mL/min/{1.73_m2} (ref >59)
GFR calc non Af Amer: 88 mL/min/{1.73_m2} (ref >59)
Globulin, Total: 2.4 g/dL (ref 1.5–4.5)
Glucose: 91 mg/dL (ref 65–99)
Potassium: 3.5 mmol/L (ref 3.5–5.2)
Sodium: 140 mmol/L (ref 134–144)
Total Protein: 7 g/dL (ref 6.0–8.5)

## 2019-06-15 LAB — CBC WITH DIFFERENTIAL/PLATELET
Basophils Absolute: 0.1 10*3/uL (ref 0.0–0.2)
Basos: 1 %
EOS (ABSOLUTE): 0.1 10*3/uL (ref 0.0–0.4)
Eos: 2 %
Hematocrit: 43.8 % (ref 34.0–46.6)
Hemoglobin: 14.9 g/dL (ref 11.1–15.9)
Immature Grans (Abs): 0 10*3/uL (ref 0.0–0.1)
Immature Granulocytes: 0 %
Lymphocytes Absolute: 1.8 10*3/uL (ref 0.7–3.1)
Lymphs: 28 %
MCH: 28.9 pg (ref 26.6–33.0)
MCHC: 34 g/dL (ref 31.5–35.7)
MCV: 85 fL (ref 79–97)
Monocytes Absolute: 0.4 10*3/uL (ref 0.1–0.9)
Monocytes: 6 %
Neutrophils Absolute: 4 10*3/uL (ref 1.4–7.0)
Neutrophils: 63 %
Platelets: 224 10*3/uL (ref 150–450)
RBC: 5.15 x10E6/uL (ref 3.77–5.28)
RDW: 14.2 % (ref 11.7–15.4)
WBC: 6.4 10*3/uL (ref 3.4–10.8)

## 2019-06-15 LAB — TSH: TSH: 2.76 u[IU]/mL (ref 0.450–4.500)

## 2019-06-15 LAB — HEMOGLOBIN A1C
Est. average glucose Bld gHb Est-mCnc: 128 mg/dL
Hgb A1c MFr Bld: 6.1 % — ABNORMAL HIGH (ref 4.8–5.6)

## 2019-06-15 LAB — LIPID PANEL W/O CHOL/HDL RATIO
Cholesterol, Total: 188 mg/dL (ref 100–199)
HDL: 47 mg/dL (ref >39)
LDL Chol Calc (NIH): 122 mg/dL — ABNORMAL HIGH (ref 0–99)
Triglycerides: 104 mg/dL (ref 0–149)
VLDL Cholesterol Cal: 19 mg/dL (ref 5–40)

## 2019-06-15 LAB — VITAMIN B12: Vitamin B-12: 305 pg/mL (ref 232–1245)

## 2019-06-15 NOTE — Progress Notes (Signed)
Please let Tabitha Larson know her labs have returned.  Overall there is no anemia on CBC.  Kidney and liver function are normal.  Cholesterol does show some elevation in LDL, continue daily Atorvastatin and we may increase dose next visit if continued elevation in LDL.  Thyroid normal.  Vitamin B12 is on low side of normal, this could cause fatigue.  I recommend starting daily Vitamin B12 1000 MCG, which she can obtain in vitamin section, and we will recheck next visit.  A1C, diabetes testing, is 6.1%.  Anything 5.7 to 6.4 is considered prediabetes.  6.5 or greater is diabetes.  I recommend heavy focus on lower carbohydrate and sugar diet to help prevent this from entering diabetic range.  If any questions let me know.

## 2019-06-16 ENCOUNTER — Telehealth: Payer: Self-pay | Admitting: Nurse Practitioner

## 2019-06-16 NOTE — Telephone Encounter (Signed)
See result note.  

## 2019-06-16 NOTE — Telephone Encounter (Signed)
Patient returning call for lab results. 

## 2019-06-29 ENCOUNTER — Ambulatory Visit: Payer: Medicare PPO | Attending: Internal Medicine

## 2019-06-29 DIAGNOSIS — Z23 Encounter for immunization: Secondary | ICD-10-CM

## 2019-06-29 NOTE — Progress Notes (Signed)
   Covid-19 Vaccination [facility]  Name:  Tabitha Larson    MRN: [id number] DOB: 01-03-1951  06/29/2019  Ms. Tabitha Larson was observed post Covid-19 immunization for 15 minutes without incident. She was provided with Vaccine Information Sheet and instruction to access the V-Safe system.   Ms. Tabitha Larson was instructed to call 911 with any severe reactions post vaccine: Marland Kitchen Difficulty breathing  . Swelling of face and throat  . A fast heartbeat  . A bad rash all over body  . Dizziness and weakness   Immunizations Administered    Name Date Dose VIS Date Route   Pfizer COVID-19 Vaccine 06/29/2019 11:34 AM 0.3 mL 04/28/2018 Intramuscular   Manufacturer: [place]   Lot: [id number]   [place]: [id number]

## 2019-07-26 ENCOUNTER — Ambulatory Visit
Admission: RE | Admit: 2019-07-26 | Discharge: 2019-07-26 | Disposition: A | Discharge status: Home / Self Care | Payer: Medicare PPO | Source: Ambulatory Visit | Attending: Nurse Practitioner | Admitting: Nurse Practitioner

## 2019-07-26 ENCOUNTER — Ambulatory Visit: Admission: RE | Admit: 2019-07-26 | Payer: Medicare PPO | Source: Ambulatory Visit | Admitting: *Deleted

## 2019-07-26 ENCOUNTER — Ambulatory Visit (INDEPENDENT_AMBULATORY_CARE_PROVIDER_SITE_OTHER): Payer: Medicare PPO | Admitting: Nurse Practitioner

## 2019-07-26 ENCOUNTER — Other Ambulatory Visit: Payer: Self-pay

## 2019-07-26 ENCOUNTER — Encounter: Payer: Self-pay | Admitting: Nurse Practitioner

## 2019-07-26 VITALS — BP 132/83 | HR 58 | Temp 98.1°F | Wt 141.2 lb

## 2019-07-26 DIAGNOSIS — R7309 Other abnormal glucose: Secondary | ICD-10-CM

## 2019-07-26 DIAGNOSIS — R5383 Other fatigue: Secondary | ICD-10-CM

## 2019-07-26 DIAGNOSIS — E538 Deficiency of other specified B group vitamins: Secondary | ICD-10-CM

## 2019-07-26 NOTE — Progress Notes (Signed)
Please let Tabitha Larson know that her chest x-ray returned and there are no acute findings.  This is good news.  Have a great day!!

## 2019-07-26 NOTE — Assessment & Plan Note (Signed)
Present x 6 months with no worsening.  No red flag symptoms or weight loss noted on review.  Will obtain ESR, CRP, BMP, ANA, and CK today.  Obtain CXR due to past history of smoking.  Refer for sleep study. Possibly related to mood due to recent losses in family and spiritual concerns.  Recommend she seek therapy via her church and if unable to obtain here will place referral for therapy for patient.  If ongoing discussed with her pursuing CT scan of lungs, due to past history of smoking + repeat colonoscopy.  Return in 6 weeks, sooner if worsening.

## 2019-07-26 NOTE — Progress Notes (Signed)
BP 132/83   Pulse (!) 58   Temp 98.1 F (36.7 C) (Oral)   Wt 141 lb 3.2 oz (64 kg)   LMP  (LMP Unknown)   SpO2 99%   BMI 25.41 kg/m    Subjective:    Patient ID: Tabitha Larson, female    DOB: [birth year], 69 y.o.   MRN: [id number]  HPI: Tabitha Larson is a 69 y.o. female  Chief Complaint  Patient presents with  . Fatigue    6 week f/up- pt states she is still feeling fatigued and getting tired    FATIGUE Has been present for 6 months, gradually came on.  She endorses good sleep pattern.  Has had sleep apnea test about 4-5 years ago which she reports was okay.  Recent labs performed 06/14/19 -- B12 on low side of normal at 305, A1C in prediabetic range at 6.1, LDL 122, CBC, TSH and CMP within normal ranges.  She has not started taking B12 as of yet.  Notices symptoms worse in morning when first wakes up, hard to get to coffee machine.  Reports low energy, if she is up too long she starts hurting in back and legs, then when rests it goes away. Past smoker, smoked for 20 years, quit in 1985.  Smoked one pack per day.  No loss of appetite, fevers, night sweats.  Does have a mild cough at night, but none during day.  No alcohol or drug use.  Last colonoscopy was in 2017 -- she is to repeat in 5 years (2022) due to precancerous polyps.  Denies any constipation, diarrhea, blood in stool.  Duration:  chronic Severity: mild -- in morning, no other time Onset: gradual Context when symptoms started:  unknown Symptoms improve with rest: yes -- 15 minute nap and is better Depressive symptoms: at times Stress/anxiety: a little bit recently due to ill family members Insomnia: no none Snoring: no Observed apnea by bed partner: no Daytime hypersomnolence:no Wakes feeling refreshed: yes History of sleep study: yes Dysnea on exertion:  no Orthopnea/PND: no -- sleeps on two pillows at baseline Chest pain: no Chronic cough: no Lower extremity edema: only if on feet a long time  Arthralgias:no Myalgias: no Weakness: no Rash: no  DEPRESSION Continues on Duloxetine.  Reports having more good days then bad days.  Denies SI/HI.  She does report needing to go to therapy. She is going to reach out to church as reports he needs are more spiritual.   Mood status: stable Satisfied with current treatment?: yes Symptom severity: mild  Duration of current treatment : chronic Side effects: no Medication compliance: good compliance Psychotherapy/counseling: none Depressed mood: occasional Anxious mood: no Anhedonia: no Significant weight loss or gain: no Insomnia: none Fatigue: no Feelings of worthlessness or guilt: no Impaired concentration/indecisiveness: no Suicidal ideations: no Hopelessness: no Crying spells: no Depression screen [facility] 2/9 07/26/2019 06/14/2019 06/14/2019 10/14/2018 07/09/2018  Decreased Interest 0 0 0 1 1  Down, Depressed, Hopeless _0 PHQ - 2 Score _1 Altered sleeping 0 0 - 1 2  Tired, decreased energy 3 3 - 1 3  Change in appetite 1 3 - 3 3  Feeling bad or failure about yourself  1 1 - 1 2  Trouble concentrating 1 1 - 1 2  Moving slowly or fidgety/restless 0 0 - 0 0  Suicidal thoughts 0 0 - 0 0  PHQ-9 Score 7 9 - 9 15  Difficult doing work/chores Somewhat difficult Not difficult at all - Not difficult at all Somewhat difficult  Some recent data might be hidden    Relevant past medical, surgical, family and social history reviewed and updated as indicated. Interim medical history since our last visit reviewed. Allergies and medications reviewed and updated.  Review of Systems  Constitutional: Positive for fatigue. Negative for activity change, appetite change, diaphoresis and fever.  Respiratory: Negative for cough, chest tightness and shortness of breath.   Cardiovascular: Negative for chest pain, palpitations and leg swelling.  Gastrointestinal: Negative.   Endocrine: Negative for cold intolerance and heat intolerance.   Neurological: Negative.   Psychiatric/Behavioral: Positive for decreased concentration. Negative for self-injury, sleep disturbance and suicidal ideas. The patient is not nervous/anxious.     Per HPI unless specifically indicated above     Objective:    BP 132/83   Pulse (!) 58   Temp 98.1 F (36.7 C) (Oral)   Wt 141 lb 3.2 oz (64 kg)   LMP  (LMP Unknown)   SpO2 99%   BMI 25.41 kg/m   Wt Readings from Last 3 Encounters:  07/26/19 141 lb 3.2 oz (64 kg)  06/14/19 141 lb (64 kg)  06/14/19 141 lb (64 kg)    Physical Exam Vitals and nursing note reviewed.  Constitutional:      General: She is awake. She is not in acute distress.    Appearance: She is well-developed and well-groomed. She is not ill-appearing.  HENT:     Head: Normocephalic.     Right Ear: Hearing normal.     Left Ear: Hearing normal.  Eyes:     General: Lids are normal.        Right eye: No discharge.        Left eye: No discharge.     Conjunctiva/sclera: Conjunctivae normal.     Pupils: Pupils are equal, round, and reactive to light.  Neck:     Thyroid: No thyromegaly.     Vascular: No carotid bruit.  Cardiovascular:     Rate and Rhythm: Normal rate and regular rhythm.     Heart sounds: Normal heart sounds. No murmur. No gallop.   Pulmonary:     Effort: Pulmonary effort is normal. No accessory muscle usage or respiratory distress.     Breath sounds: Normal breath sounds.  Abdominal:     General: Bowel sounds are normal.     Palpations: Abdomen is soft.  Musculoskeletal:     Cervical back: Normal range of motion and neck supple.     Right lower leg: No edema.     Left lower leg: No edema.  Lymphadenopathy:     Cervical: No cervical adenopathy.  Skin:    General: Skin is warm and dry.  Neurological:     Mental Status: She is alert and oriented to person, place, and time.  Psychiatric:        Attention and Perception: Attention normal.        Mood and Affect: Mood normal.        Speech: Speech  normal.        Behavior: Behavior normal. Behavior is cooperative.        Thought Content: Thought content normal.     Results for orders placed or performed in visit on 06/14/19  CBC with Differential/Platelet  Result Value Ref Range   WBC 6.4 3.4 - 10.8 x10E3/uL   RBC 5.15 3.77 - 5.28 x10E6/uL   Hemoglobin 14.9 11.1 -  15.9 g/dL   Hematocrit 43.8 34.0 - 46.6 %   MCV 85 79 - 97 fL   MCH 28.9 26.6 - 33.0 pg   MCHC 34.0 31.5 - 35.7 g/dL   RDW 14.2 11.7 - 15.4 %   Platelets 224 150 - 450 x10E3/uL   Neutrophils 63 Not Estab. %   Lymphs 28 Not Estab. %   Monocytes 6 Not Estab. %   Eos 2 Not Estab. %   Basos 1 Not Estab. %   Neutrophils Absolute 4.0 1.4 - 7.0 x10E3/uL   Lymphocytes Absolute 1.8 0.7 - 3.1 x10E3/uL   Monocytes Absolute 0.4 0.1 - 0.9 x10E3/uL   EOS (ABSOLUTE) 0.1 0.0 - 0.4 x10E3/uL   Basophils Absolute 0.1 0.0 - 0.2 x10E3/uL   Immature Granulocytes 0 Not Estab. %   Immature Grans (Abs) 0.0 0.0 - 0.1 x10E3/uL  Comprehensive metabolic panel  Result Value Ref Range   Glucose 91 65 - 99 mg/dL   BUN 12 8 - 27 mg/dL   Creatinine, Ser 0.69 0.57 - 1.00 mg/dL   GFR calc non Af Amer 88 >59 mL/min/1.73   GFR calc Af Amer 102 >59 mL/min/1.73   BUN/Creatinine Ratio 17 12 - 28   Sodium 140 134 - 144 mmol/L   Potassium 3.5 3.5 - 5.2 mmol/L   Chloride 98 96 - 106 mmol/L   CO2 25 20 - 29 mmol/L   Calcium 10.2 8.7 - 10.3 mg/dL   Total Protein 7.0 6.0 - 8.5 g/dL   Albumin 4.6 3.8 - 4.8 g/dL   Globulin, Total 2.4 1.5 - 4.5 g/dL   Albumin/Globulin Ratio 1.9 1.2 - 2.2   Bilirubin Total 0.4 0.0 - 1.2 mg/dL   Alkaline Phosphatase 92 39 - 117 IU/L   AST 18 0 - 40 IU/L   ALT 19 0 - 32 IU/L  Lipid Panel w/o Chol/HDL Ratio  Result Value Ref Range   Cholesterol, Total 188 100 - 199 mg/dL   Triglycerides 104 0 - 149 mg/dL   HDL 47 >39 mg/dL   VLDL Cholesterol Cal 19 5 - 40 mg/dL   LDL Chol Calc (NIH) 122 (H) 0 - 99 mg/dL  TSH  Result Value Ref Range   TSH 2.760 0.450 - 4.500  uIU/mL  B12  Result Value Ref Range   Vitamin B-12 305 232 - 1,245 pg/mL  HgB A1c  Result Value Ref Range   Hgb A1c MFr Bld 6.1 (H) 4.8 - 5.6 %   Est. average glucose Bld gHb Est-mCnc 128 mg/dL      Assessment & Plan:   Problem List Items Addressed This Visit      Other   Fatigue - Primary    Present x 6 months with no worsening.  No red flag symptoms or weight loss noted on review.  Will obtain ESR, CRP, BMP, ANA, and CK today.  Obtain CXR due to past history of smoking.  Refer for sleep study. Possibly related to mood due to recent losses in family and spiritual concerns.  Recommend she seek therapy via her church and if unable to obtain here will place referral for therapy for patient.  If ongoing discussed with her pursuing CT scan of lungs, due to past history of smoking + repeat colonoscopy.  Return in 6 weeks, sooner if worsening.      Relevant Orders   Sed Rate (ESR)   C-reactive protein   Basic metabolic panel   ANA w/Reflex if Positive   CK (Creatine  Kinase)   Ambulatory referral to Sleep Studies   Elevated hemoglobin A1c    Noted on recent labs at 6.1%, will recheck in July.      B12 deficiency    Noted on recent labs with level on low side of normal at 305, recommend she start daily supplement 1000 MCG of Vitamin B12 and will recheck next visit.  Educated her on this supplement.          Follow up plan: Return in about 6 weeks (around 09/06/2019) for Fatigue.

## 2019-07-26 NOTE — Patient Instructions (Addendum)
Take 1000 MCG Vitamin B12 daily.  Vitamin B12 Deficiency Vitamin B12 deficiency occurs when the body does not have enough vitamin B12, which is an important vitamin. The body needs this vitamin:  To make red blood cells.  To make DNA. This is the genetic material inside cells.  To help the nerves work properly so they can carry messages from the brain to the body. Vitamin B12 deficiency can cause various health problems, such as a low red blood cell count (anemia) or nerve damage. What are the causes? This condition may be caused by:  Not eating enough foods that contain vitamin B12.  Not having enough stomach acid and digestive fluids to properly absorb vitamin B12 from the food that you eat.  Certain digestive system diseases that make it hard to absorb vitamin B12. These diseases include Crohn's disease, chronic pancreatitis, and cystic fibrosis.  A condition in which the body does not make enough of a protein (intrinsic factor), resulting in too few red blood cells (pernicious anemia).  Having a surgery in which part of the stomach or small intestine is removed.  Taking certain medicines that make it hard for the body to absorb vitamin B12. These medicines include: ? Heartburn medicines (antacids and proton pump inhibitors). ? Certain antibiotic medicines. ? Some medicines that are used to treat diabetes, tuberculosis, gout, or high cholesterol. What increases the risk? The following factors may make you more likely to develop a B12 deficiency:  Being older than age 7.  Eating a vegetarian or vegan diet, especially while you are pregnant.  Eating a poor diet while you are pregnant.  Taking certain medicines.  Having alcoholism. What are the signs or symptoms? In some cases, there are no symptoms of this condition. If the condition leads to anemia or nerve damage, various symptoms can occur, such as:  Weakness.  Fatigue.  Loss of appetite.  Weight  loss.  Numbness or tingling in your hands and feet.  Redness and burning of the tongue.  Confusion or memory problems.  Depression.  Sensory problems, such as color blindness, ringing in the ears, or loss of taste.  Diarrhea or constipation.  Trouble walking. If anemia is severe, symptoms can include:  Shortness of breath.  Dizziness.  Rapid heart rate (tachycardia). How is this diagnosed? This condition may be diagnosed with a blood test to measure the level of vitamin B12 in your blood. You may also have other tests, including:  A group of tests that measure certain characteristics of blood cells (complete blood count, CBC).  A blood test to measure intrinsic factor.  A procedure where a thin tube with a camera on the end is used to look into your stomach or intestines (endoscopy). Other tests may be needed to discover the cause of B12 deficiency. How is this treated? Treatment for this condition depends on the cause. This condition may be treated by:  Changing your eating and drinking habits, such as: ? Eating more foods that contain vitamin B12. ? Drinking less alcohol or no alcohol.  Getting vitamin B12 injections.  Taking vitamin B12 supplements. Your health care provider will tell you which dosage is best for you. Follow these instructions at home: Eating and drinking   Eat lots of healthy foods that contain vitamin B12, including: ? Meats and poultry. This includes beef, pork, chicken, [place], and organ meats, such as liver. ? Seafood. This includes clams, rainbow trout, salmon, tuna, and haddock. ? Eggs. ? Cereal and dairy products that  are fortified. This means that vitamin B12 has been added to the food. Check the label on the package to see if the food is fortified. The items listed above may not be a complete list of recommended foods and beverages. Contact a dietitian for more information. General instructions  Get any injections that are prescribed  by your health care provider.  Take supplements only as told by your health care provider. Follow the directions carefully.  Do not drink alcohol if your health care provider tells you not to. In some cases, you may only be asked to limit alcohol use.  Keep all follow-up visits as told by your health care provider. This is important. Contact a health care provider if:  Your symptoms come back. Get help right away if you:  Develop shortness of breath.  Have a rapid heart rate.  Have chest pain.  Become dizzy or lose consciousness. Summary  Vitamin B12 deficiency occurs when the body does not have enough vitamin B12.  The main causes of vitamin B12 deficiency include dietary deficiency, digestive diseases, pernicious anemia, and having a surgery in which part of the stomach or small intestine is removed.  In some cases, there are no symptoms of this condition. If the condition leads to anemia or nerve damage, various symptoms can occur, such as weakness, shortness of breath, and numbness.  Treatment may include getting vitamin B12 injections or taking vitamin B12 supplements. Eat lots of healthy foods that contain vitamin B12. This information is not intended to replace advice given to you by your health care provider. Make sure you discuss any questions you have with your health care provider. Document Revised: 08/07/2018 Document Reviewed: 10/28/2017 Elsevier Patient Education  2020 [employer].

## 2019-07-26 NOTE — Assessment & Plan Note (Signed)
Noted on recent labs with level on low side of normal at 305, recommend she start daily supplement 1000 MCG of Vitamin B12 and will recheck next visit.  Educated her on this supplement.

## 2019-07-26 NOTE — Addendum Note (Signed)
Addended by: Marnee Guarneri T on: 07/26/2019 10:43 AM   Modules accepted: Orders

## 2019-07-26 NOTE — Assessment & Plan Note (Signed)
Noted on recent labs at 6.1%, will recheck in July.

## 2019-07-27 LAB — BASIC METABOLIC PANEL
BUN/Creatinine Ratio: 17 (ref 12–28)
BUN: 13 mg/dL (ref 8–27)
CO2: 24 mmol/L (ref 20–29)
Calcium: 9.7 mg/dL (ref 8.7–10.3)
Chloride: 102 mmol/L (ref 96–106)
Creatinine, Ser: 0.75 mg/dL (ref 0.57–1.00)
GFR calc Af Amer: 93 mL/min/{1.73_m2} (ref >59)
GFR calc non Af Amer: 81 mL/min/{1.73_m2} (ref >59)
Glucose: 89 mg/dL (ref 65–99)
Potassium: 4.2 mmol/L (ref 3.5–5.2)
Sodium: 141 mmol/L (ref 134–144)

## 2019-07-27 LAB — SEDIMENTATION RATE: Sed Rate: 6 mm/hr (ref 0–40)

## 2019-07-27 LAB — ANA W/REFLEX IF POSITIVE: Anti Nuclear Antibody (ANA): NEGATIVE

## 2019-07-27 LAB — CK: Total CK: 64 U/L (ref 32–182)

## 2019-07-27 LAB — C-REACTIVE PROTEIN: CRP: 3 mg/L (ref 0–10)

## 2019-07-27 NOTE — Progress Notes (Signed)
Good afternoon, please let Tabitha Larson know all her labs returned normal from yesterday.  No inflammatory processes noted and kidney function + electrolytes remain normal.  Will see her back for follow-up and let me know if you do not hear from sleep referral.  Have a great day!!

## 2019-08-26 ENCOUNTER — Telehealth: Payer: Self-pay | Admitting: Nurse Practitioner

## 2019-08-26 NOTE — Telephone Encounter (Signed)
Wells Guiles calling from [facility] is calling to request Office Visit prior 07/26/19. Request OV notes to be faxed Fax- [phone]  Cb- [phone]

## 2019-08-26 NOTE — Telephone Encounter (Signed)
She is not our pt

## 2019-09-07 ENCOUNTER — Ambulatory Visit (INDEPENDENT_AMBULATORY_CARE_PROVIDER_SITE_OTHER): Payer: Medicare PPO | Admitting: Nurse Practitioner

## 2019-09-07 ENCOUNTER — Encounter: Payer: Self-pay | Admitting: Nurse Practitioner

## 2019-09-07 ENCOUNTER — Other Ambulatory Visit: Payer: Self-pay

## 2019-09-07 VITALS — BP 122/74 | HR 71 | Temp 98.3°F | Wt 139.4 lb

## 2019-09-07 DIAGNOSIS — E538 Deficiency of other specified B group vitamins: Secondary | ICD-10-CM

## 2019-09-07 DIAGNOSIS — R5383 Other fatigue: Secondary | ICD-10-CM | POA: Diagnosis not present

## 2019-09-07 DIAGNOSIS — F332 Major depressive disorder, recurrent severe without psychotic features: Secondary | ICD-10-CM

## 2019-09-07 MED ORDER — SERTRALINE HCL 50 MG PO TABS: ORAL_TABLET | ORAL | 3 refills | Status: DC

## 2019-09-07 MED ORDER — DULOXETINE HCL 30 MG PO CPEP: ORAL_CAPSULE | ORAL | 0 refills | Status: DC

## 2019-09-07 NOTE — Assessment & Plan Note (Signed)
Chronic, ongoing.  Denies SI/HI.  Suspect some of fatigue may be related to mood at this time, recommend continue therapy through her church for spiritual guidance. At this time will transition off Duloxetine by decreasing dose to 30 MG daily x one week, then every other day x one week and then discontinue, while bringing in Zoloft at 25 MG daily x one week and then increase to 50 MG daily.  Follow-up in 4 weeks, sooner if worsening mood.

## 2019-09-07 NOTE — Assessment & Plan Note (Signed)
Improving at this time with addition of B12.  No red flag symptoms or weight loss noted on review + recent labs WNL.  Possibly related to mood due to recent losses in family and spiritual concerns.  Recommend she continue therapy via her church and if unable to obtain here will place referral for therapy for patient.  If ongoing discussed with her pursuing CT scan of lungs, due to past history of smoking + repeat colonoscopy.  Return in 4 weeks.

## 2019-09-07 NOTE — Patient Instructions (Signed)
Living With Depression Everyone experiences occasional disappointment, sadness, and loss in their lives. When you are feeling down, blue, or sad for at least 2 weeks in a row, it may mean that you have depression. Depression can affect your thoughts and feelings, relationships, daily activities, and physical health. It is caused by changes in the way your brain functions. If you receive a diagnosis of depression, your health care provider will tell you which type of depression you have and what treatment options are available to you. If you are living with depression, there are ways to help you recover from it and also ways to prevent it from coming back. How to cope with lifestyle changes Coping with stress     Stress is your body's reaction to life changes and events, both good and bad. Stressful situations may include:  Getting married.  The death of a spouse.  Losing a job.  Retiring.  Having a baby. Stress can last just a few hours or it can be ongoing. Stress can play a major role in depression, so it is important to learn both how to cope with stress and how to think about it differently. Talk with your health care provider or a counselor if you would like to learn more about stress reduction. He or she may suggest some stress reduction techniques, such as:  Music therapy. This can include creating music or listening to music. Choose music that you enjoy and that inspires you.  Mindfulness-based meditation. This kind of meditation can be done while sitting or walking. It involves being aware of your normal breaths, rather than trying to control your breathing.  Centering prayer. This is a kind of meditation that involves focusing on a spiritual word or phrase. Choose a word, phrase, or sacred image that is meaningful to you and that brings you peace.  Deep breathing. To do this, expand your stomach and inhale slowly through your nose. Hold your breath for 3-5 seconds, then exhale  slowly, allowing your stomach muscles to relax.  Muscle relaxation. This involves intentionally tensing muscles then relaxing them. Choose a stress reduction technique that fits your lifestyle and personality. Stress reduction techniques take time and practice to develop. Set aside 5-15 minutes a day to do them. Therapists can offer training in these techniques. The training may be covered by some insurance plans. Other things you can do to manage stress include:  Keeping a stress diary. This can help you learn what triggers your stress and ways to control your response.  Understanding what your limits are and saying no to requests or events that lead to a schedule that is too full.  Thinking about how you respond to certain situations. You may not be able to control everything, but you can control how you react.  Adding humor to your life by watching funny films or TV shows.  Making time for activities that help you relax and not feeling guilty about spending your time this way.  Medicines Your health care provider may suggest certain medicines if he or she feels that they will help improve your condition. Avoid using alcohol and other substances that may prevent your medicines from working properly (may interact). It is also important to:  Talk with your pharmacist or health care provider about all the medicines that you take, their possible side effects, and what medicines are safe to take together.  Make it your goal to take part in all treatment decisions (shared decision-making). This includes giving input on   the side effects of medicines. It is best if shared decision-making with your health care provider is part of your total treatment plan. If your health care provider prescribes a medicine, you may not notice the full benefits of it for 4-8 weeks. Most people who are treated for depression need to be on medicine for at least 6-12 months after they feel better. If you are taking  medicines as part of your treatment, do not stop taking medicines without first talking to your health care provider. You may need to have the medicine slowly decreased (tapered) over time to decrease the risk of harmful side effects. Relationships Your health care provider may suggest family therapy along with individual therapy and drug therapy. While there may not be family problems that are causing you to feel depressed, it is still important to make sure your family learns as much as they can about your mental health. Having your family's support can help make your treatment successful. How to recognize changes in your condition Everyone has a different response to treatment for depression. Recovery from major depression happens when you have not had signs of major depression for two months. This may mean that you will start to:  Have more interest in doing activities.  Feel less hopeless than you did 2 months ago.  Have more energy.  Overeat less often, or have better or improving appetite.  Have better concentration. Your health care provider will work with you to decide the next steps in your recovery. It is also important to recognize when your condition is getting worse. Watch for these signs:  Having fatigue or low energy.  Eating too much or too little.  Sleeping too much or too little.  Feeling restless, agitated, or hopeless.  Having trouble concentrating or making decisions.  Having unexplained physical complaints.  Feeling irritable, angry, or aggressive. Get help as soon as you or your family members notice these symptoms coming back. How to get support and help from others How to talk with friends and family members about your condition  Talking to friends and family members about your condition can provide you with one way to get support and guidance. Reach out to trusted friends or family members, explain your symptoms to them, and let them know that you are  working with a health care provider to treat your depression. Financial resources Not all insurance plans cover mental health care, so it is important to check with your insurance carrier. If paying for co-pays or counseling services is a problem, search for a local or county mental health care center. They may be able to offer public mental health care services at low or no cost when you are not able to see a private health care provider. If you are taking medicine for depression, you may be able to get the generic form, which may be less expensive. Some makers of prescription medicines also offer help to patients who cannot afford the medicines they need. Follow these instructions at home:   Get the right amount and quality of sleep.  Cut down on using caffeine, tobacco, alcohol, and other potentially harmful substances.  Try to exercise, such as walking or lifting small weights.  Take over-the-counter and prescription medicines only as told by your health care provider.  Eat a healthy diet that includes plenty of vegetables, fruits, whole grains, low-fat dairy products, and lean protein. Do not eat a lot of foods that are high in solid fats, added sugars, or salt.    Keep all follow-up visits as told by your health care provider. This is important. Contact a health care provider if:  You stop taking your antidepressant medicines, and you have any of these symptoms: ? Nausea. ? Headache. ? Feeling lightheaded. ? Chills and body aches. ? Not being able to sleep (insomnia).  You or your friends and family think your depression is getting worse. Get help right away if:  You have thoughts of hurting yourself or others. If you ever feel like you may hurt yourself or others, or have thoughts about taking your own life, get help right away. You can go to your nearest emergency department or call:  Your local emergency services (911 in the [place]  A suicide crisis helpline, such as the  National Suicide Prevention Lifeline at 1-800-273-8255. This is open 24-hours a day. Summary  If you are living with depression, there are ways to help you recover from it and also ways to prevent it from coming back.  Work with your health care team to create a management plan that includes counseling, stress management techniques, and healthy lifestyle habits. This information is not intended to replace advice given to you by your health care provider. Make sure you discuss any questions you have with your health care provider. Document Revised: 06/12/2018 Document Reviewed: 01/22/2016 Elsevier Patient Education  2020 [employer] Inc.  

## 2019-09-07 NOTE — Progress Notes (Signed)
BP 122/74    Pulse 71    Temp 98.3 F (36.8 C) (Oral)    Wt 139 lb 6.4 oz (63.2 kg)    LMP  (LMP Unknown)    SpO2 97%    BMI 25.09 kg/m    Subjective:    Patient ID: Tabitha Larson, female    DOB: 29-Dec-1950, 69 y.o.   MRN: [id number]  HPI: Tabitha Larson is a 69 y.o. female  Chief Complaint  Patient presents with   Fatigue    6 week f/up- states she is doing better    FATIGUE Follow-up for fatigue visit on 07/26/19 -- at this time she reports fatigue is getting a little better, has been taking her B12 daily.  Had been present for 6 months, gradually came on. She endorses good sleep pattern. Has had sleep apnea test about 4-5 years ago which she reports was okay.  Recent labs performed 06/14/19 -- B12 on low side of normal at 305, A1C in prediabetic range at 6.1, LDL 122, CBC, TSH and CMP within normal ranges.  CXR was done 07/26/19 and this was normal.  Past smoker, smoked for 20 years, quit in 1985.  Smoked one pack per day.  No loss of appetite, fevers, night sweats. No alcohol or drug use.  Last colonoscopy was in 2017 -- she is to repeat in 5 years (2022) due to precancerous polyps. Denies any constipation, diarrhea, blood in stool.  Duration:chronic Severity:mild -- in morning, no other time Onset:gradual Context when symptoms started:unknown Symptoms improve with rest:yes-- 15 minute nap and is better Depressive symptoms:at times Stress/anxiety:a little bit recently due to ill family members Insomnia:nonone Snoring:no Observed apnea by bed partner:no Daytime hypersomnolence:no Wakes feeling refreshed:yes History of sleep study:yes Dysnea on exertion:no Orthopnea/PND:no-- sleeps on two pillows at baseline Chest pain:no Chronic cough:no Lower extremity edema:only if on feet a long time Arthralgias:no Myalgias:no Weakness:no Rash:no  DEPRESSION Continues on Duloxetine, but feels it is not working as well anymore. Denies SI/HI. She  does report going to church for spiritual counseling, which is helping.  Has a lengthy family history of depression.   Mood status: stable Satisfied with current treatment?: yes Symptom severity: mild Duration of current treatment : chronic Side effects: no Medication compliance: good compliance Psychotherapy/counseling: none Depressed mood: yes Anxious mood:no Anhedonia: no Significant weight loss or gain: no Insomnia:none Fatigue: no Feelings of worthlessness or guilt: no Impaired concentration/indecisiveness: no Suicidal ideations: no Hopelessness: no Crying spells: no Depression screen [facility] 2/9 09/07/2019 07/26/2019 06/14/2019 06/14/2019 10/14/2018  Decreased Interest 0 0 0 0 1  Down, Depressed, Hopeless _0 PHQ - 2 Score _1 Altered sleeping 0 0 0 - 1  Tired, decreased energy _2 - 1  Change in appetite 0 1 3 - 3  Feeling bad or failure about yourself  _3 - 1  Trouble concentrating _4 - 1  Moving slowly or fidgety/restless 0 0 0 - 0  Suicidal thoughts 0 0 0 - 0  PHQ-9 Score _5 - 9  Difficult doing work/chores Somewhat difficult Somewhat difficult Not difficult at all - Not difficult at all  Some recent data might be hidden    Relevant past medical, surgical, family and social history reviewed and updated as indicated. Interim medical history since our last visit reviewed. Allergies and medications reviewed and updated.  Review of Systems  Constitutional: Negative for activity change,  appetite change, diaphoresis, fatigue and fever.  Respiratory: Negative for cough, chest tightness and shortness of breath.   Cardiovascular: Negative for chest pain, palpitations and leg swelling.  Gastrointestinal: Negative.   Endocrine: Negative for cold intolerance and heat intolerance.  Neurological: Negative.   Psychiatric/Behavioral: Positive for decreased concentration. Negative for self-injury, sleep disturbance and suicidal ideas. The patient is not  nervous/anxious.     Per HPI unless specifically indicated above     Objective:    BP 122/74    Pulse 71    Temp 98.3 F (36.8 C) (Oral)    Wt 139 lb 6.4 oz (63.2 kg)    LMP  (LMP Unknown)    SpO2 97%    BMI 25.09 kg/m   Wt Readings from Last 3 Encounters:  09/07/19 139 lb 6.4 oz (63.2 kg)  07/26/19 141 lb 3.2 oz (64 kg)  06/14/19 141 lb (64 kg)    Physical Exam Vitals and nursing note reviewed.  Constitutional:      General: She is awake. She is not in acute distress.    Appearance: She is well-developed and well-groomed. She is not ill-appearing.  HENT:     Head: Normocephalic.     Right Ear: Hearing normal.     Left Ear: Hearing normal.  Eyes:     General: Lids are normal.        Right eye: No discharge.        Left eye: No discharge.     Conjunctiva/sclera: Conjunctivae normal.     Pupils: Pupils are equal, round, and reactive to light.  Neck:     Thyroid: No thyromegaly.     Vascular: No carotid bruit.  Cardiovascular:     Rate and Rhythm: Normal rate and regular rhythm.     Heart sounds: Normal heart sounds. No murmur heard.  No gallop.   Pulmonary:     Effort: Pulmonary effort is normal. No accessory muscle usage or respiratory distress.     Breath sounds: Normal breath sounds.  Abdominal:     General: Bowel sounds are normal.     Palpations: Abdomen is soft.  Musculoskeletal:     Cervical back: Normal range of motion and neck supple.     Right lower leg: No edema.     Left lower leg: No edema.  Lymphadenopathy:     Cervical: No cervical adenopathy.  Skin:    General: Skin is warm and dry.  Neurological:     Mental Status: She is alert and oriented to person, place, and time.  Psychiatric:        Attention and Perception: Attention normal.        Mood and Affect: Mood normal.        Speech: Speech normal.        Behavior: Behavior normal. Behavior is cooperative.        Thought Content: Thought content normal.     Results for orders placed or  performed in visit on 07/26/19  Sed Rate (ESR)  Result Value Ref Range   Sed Rate 6 0 - 40 mm/hr  C-reactive protein  Result Value Ref Range   CRP 3 0 - 10 mg/L  Basic metabolic panel  Result Value Ref Range   Glucose 89 65 - 99 mg/dL   BUN 13 8 - 27 mg/dL   Creatinine, Ser 0.75 0.57 - 1.00 mg/dL   GFR calc non Af Amer 81 >59 mL/min/1.73   GFR calc Af Amer 93 >59  mL/min/1.73   BUN/Creatinine Ratio 17 12 - 28   Sodium 141 134 - 144 mmol/L   Potassium 4.2 3.5 - 5.2 mmol/L   Chloride 102 96 - 106 mmol/L   CO2 24 20 - 29 mmol/L   Calcium 9.7 8.7 - 10.3 mg/dL  ANA w/Reflex if Positive  Result Value Ref Range   Anti Nuclear Antibody (ANA) Negative Negative  CK (Creatine Kinase)  Result Value Ref Range   Total CK 64 32.0 - 182.0 U/L      Assessment & Plan:   Problem List Items Addressed This Visit      Other   Major depression - Primary    Chronic, ongoing.  Denies SI/HI.  Suspect some of fatigue may be related to mood at this time, recommend continue therapy through her church for spiritual guidance. At this time will transition off Duloxetine by decreasing dose to 30 MG daily x one week, then every other day x one week and then discontinue, while bringing in Zoloft at 25 MG daily x one week and then increase to 50 MG daily.  Follow-up in 4 weeks, sooner if worsening mood.      Relevant Medications   DULoxetine (CYMBALTA) 30 MG capsule   sertraline (ZOLOFT) 50 MG tablet   Fatigue    Improving at this time with addition of B12.  No red flag symptoms or weight loss noted on review + recent labs WNL.  Possibly related to mood due to recent losses in family and spiritual concerns.  Recommend she continue therapy via her church and if unable to obtain here will place referral for therapy for patient.  If ongoing discussed with her pursuing CT scan of lungs, due to past history of smoking + repeat colonoscopy.  Return in 4 weeks.      B12 deficiency    Continue daily supplement and  recheck B12 level next visit in 4 weeks.          Follow up plan: Return in about 4 weeks (around 10/05/2019) for Depression.

## 2019-09-07 NOTE — Assessment & Plan Note (Signed)
Continue daily supplement and recheck B12 level next visit in 4 weeks.

## 2019-10-07 ENCOUNTER — Other Ambulatory Visit: Payer: Self-pay

## 2019-10-07 ENCOUNTER — Ambulatory Visit: Payer: Medicare PPO | Admitting: Nurse Practitioner

## 2019-10-07 ENCOUNTER — Encounter: Payer: Self-pay | Admitting: Nurse Practitioner

## 2019-10-07 VITALS — BP 130/75 | HR 55 | Temp 98.1°F | Wt 136.2 lb

## 2019-10-07 DIAGNOSIS — F332 Major depressive disorder, recurrent severe without psychotic features: Secondary | ICD-10-CM | POA: Diagnosis not present

## 2019-10-07 MED ORDER — SERTRALINE HCL 100 MG PO TABS: 100.0000 mg | ORAL_TABLET | Freq: Every day | ORAL | 3 refills | Status: DC

## 2019-10-07 NOTE — Assessment & Plan Note (Signed)
Chronic, ongoing.  Denies SI/HI.  Suspect some of fatigue may be related to mood at this time, recommend continue therapy through her church for spiritual guidance. At this time will increase Sertraline to 100 MG daily, script sent.  Follow-up in 6 weeks, sooner if worsening mood.

## 2019-10-07 NOTE — Patient Instructions (Signed)
Living With Depression Everyone experiences occasional disappointment, sadness, and loss in their lives. When you are feeling down, blue, or sad for at least 2 weeks in a row, it may mean that you have depression. Depression can affect your thoughts and feelings, relationships, daily activities, and physical health. It is caused by changes in the way your brain functions. If you receive a diagnosis of depression, your health care provider will tell you which type of depression you have and what treatment options are available to you. If you are living with depression, there are ways to help you recover from it and also ways to prevent it from coming back. How to cope with lifestyle changes Coping with stress     Stress is your body's reaction to life changes and events, both good and bad. Stressful situations may include:  Getting married.  The death of a spouse.  Losing a job.  Retiring.  Having a baby. Stress can last just a few hours or it can be ongoing. Stress can play a major role in depression, so it is important to learn both how to cope with stress and how to think about it differently. Talk with your health care provider or a counselor if you would like to learn more about stress reduction. He or she may suggest some stress reduction techniques, such as:  Music therapy. This can include creating music or listening to music. Choose music that you enjoy and that inspires you.  Mindfulness-based meditation. This kind of meditation can be done while sitting or walking. It involves being aware of your normal breaths, rather than trying to control your breathing.  Centering prayer. This is a kind of meditation that involves focusing on a spiritual word or phrase. Choose a word, phrase, or sacred image that is meaningful to you and that brings you peace.  Deep breathing. To do this, expand your stomach and inhale slowly through your nose. Hold your breath for 3-5 seconds, then exhale  slowly, allowing your stomach muscles to relax.  Muscle relaxation. This involves intentionally tensing muscles then relaxing them. Choose a stress reduction technique that fits your lifestyle and personality. Stress reduction techniques take time and practice to develop. Set aside 5-15 minutes a day to do them. Therapists can offer training in these techniques. The training may be covered by some insurance plans. Other things you can do to manage stress include:  Keeping a stress diary. This can help you learn what triggers your stress and ways to control your response.  Understanding what your limits are and saying no to requests or events that lead to a schedule that is too full.  Thinking about how you respond to certain situations. You may not be able to control everything, but you can control how you react.  Adding humor to your life by watching funny films or TV shows.  Making time for activities that help you relax and not feeling guilty about spending your time this way.  Medicines Your health care provider may suggest certain medicines if he or she feels that they will help improve your condition. Avoid using alcohol and other substances that may prevent your medicines from working properly (may interact). It is also important to:  Talk with your pharmacist or health care provider about all the medicines that you take, their possible side effects, and what medicines are safe to take together.  Make it your goal to take part in all treatment decisions (shared decision-making). This includes giving input on   the side effects of medicines. It is best if shared decision-making with your health care provider is part of your total treatment plan. If your health care provider prescribes a medicine, you may not notice the full benefits of it for 4-8 weeks. Most people who are treated for depression need to be on medicine for at least 6-12 months after they feel better. If you are taking  medicines as part of your treatment, do not stop taking medicines without first talking to your health care provider. You may need to have the medicine slowly decreased (tapered) over time to decrease the risk of harmful side effects. Relationships Your health care provider may suggest family therapy along with individual therapy and drug therapy. While there may not be family problems that are causing you to feel depressed, it is still important to make sure your family learns as much as they can about your mental health. Having your family's support can help make your treatment successful. How to recognize changes in your condition Everyone has a different response to treatment for depression. Recovery from major depression happens when you have not had signs of major depression for two months. This may mean that you will start to:  Have more interest in doing activities.  Feel less hopeless than you did 2 months ago.  Have more energy.  Overeat less often, or have better or improving appetite.  Have better concentration. Your health care provider will work with you to decide the next steps in your recovery. It is also important to recognize when your condition is getting worse. Watch for these signs:  Having fatigue or low energy.  Eating too much or too little.  Sleeping too much or too little.  Feeling restless, agitated, or hopeless.  Having trouble concentrating or making decisions.  Having unexplained physical complaints.  Feeling irritable, angry, or aggressive. Get help as soon as you or your family members notice these symptoms coming back. How to get support and help from others How to talk with friends and family members about your condition  Talking to friends and family members about your condition can provide you with one way to get support and guidance. Reach out to trusted friends or family members, explain your symptoms to them, and let them know that you are  working with a health care provider to treat your depression. Financial resources Not all insurance plans cover mental health care, so it is important to check with your insurance carrier. If paying for co-pays or counseling services is a problem, search for a local or county mental health care center. They may be able to offer public mental health care services at low or no cost when you are not able to see a private health care provider. If you are taking medicine for depression, you may be able to get the generic form, which may be less expensive. Some makers of prescription medicines also offer help to patients who cannot afford the medicines they need. Follow these instructions at home:   Get the right amount and quality of sleep.  Cut down on using caffeine, tobacco, alcohol, and other potentially harmful substances.  Try to exercise, such as walking or lifting small weights.  Take over-the-counter and prescription medicines only as told by your health care provider.  Eat a healthy diet that includes plenty of vegetables, fruits, whole grains, low-fat dairy products, and lean protein. Do not eat a lot of foods that are high in solid fats, added sugars, or salt.    Keep all follow-up visits as told by your health care provider. This is important. Contact a health care provider if:  You stop taking your antidepressant medicines, and you have any of these symptoms: ? Nausea. ? Headache. ? Feeling lightheaded. ? Chills and body aches. ? Not being able to sleep (insomnia).  You or your friends and family think your depression is getting worse. Get help right away if:  You have thoughts of hurting yourself or others. If you ever feel like you may hurt yourself or others, or have thoughts about taking your own life, get help right away. You can go to your nearest emergency department or call:  Your local emergency services (911 in the [place]  A suicide crisis helpline, such as the  National Suicide Prevention Lifeline at 1-800-273-8255. This is open 24-hours a day. Summary  If you are living with depression, there are ways to help you recover from it and also ways to prevent it from coming back.  Work with your health care team to create a management plan that includes counseling, stress management techniques, and healthy lifestyle habits. This information is not intended to replace advice given to you by your health care provider. Make sure you discuss any questions you have with your health care provider. Document Revised: 06/12/2018 Document Reviewed: 01/22/2016 Elsevier Patient Education  2020 [employer] Inc.  

## 2019-10-07 NOTE — Progress Notes (Signed)
BP 130/75   Pulse (!) 55   Temp 98.1 F (36.7 C) (Oral)   Wt 136 lb 3.2 oz (61.8 kg)   LMP  (LMP Unknown)   SpO2 98%   BMI 24.51 kg/m    Subjective:    Patient ID: Tabitha Larson, female    DOB: [birth year], 69 y.o.   MRN: [id number]  HPI: ISMELDA WEATHERMAN is a 69 y.o. female  Chief Complaint  Patient presents with  . Depression    4 week f/up   DEPRESSION Last visit changes from Duloxetine to Sertraline, does not notice much difference yet.  Denies SI/HI. She does report going to church for spiritual counseling, which is helping.  Has a lengthy family history of depression.  Good days vs bad days is 50/50.  Mood status: stable Satisfied with current treatment?: yes Symptom severity: mild Duration of current treatment : chronic Side effects: no Medication compliance: good compliance Psychotherapy/counseling: none Depressed mood: yes Anxious mood:no Anhedonia: no Significant weight loss or gain: no Insomnia:none Fatigue: no Feelings of worthlessness or guilt: no Impaired concentration/indecisiveness: no Suicidal ideations: no Hopelessness: no Crying spells: no Depression screen [facility] 2/9 10/07/2019 09/07/2019 07/26/2019 06/14/2019 06/14/2019  Decreased Interest 1 0 0 0 0  Down, Depressed, Hopeless '1 2 1 1 1  ' PHQ - 2 Score '2 2 1 1 1  ' Altered sleeping 1 0 0 0 -  Tired, decreased energy '1 2 3 3 ' -  Change in appetite 3 0 1 3 -  Feeling bad or failure about yourself  '2 1 1 1 ' -  Trouble concentrating '2 1 1 1 ' -  Moving slowly or fidgety/restless 0 0 0 0 -  Suicidal thoughts 0 0 0 0 -  PHQ-9 Score '11 6 7 9 ' -  Difficult doing work/chores Not difficult at all Somewhat difficult Somewhat difficult Not difficult at all -  Some recent data might be hidden   GAD 7 : Generalized Anxiety Score 10/07/2019  Nervous, Anxious, on Edge 0  Control/stop worrying 3  Worry too much - different things 3  Trouble relaxing 2  Restless 2  Easily annoyed or irritable 2  Afraid - awful  might happen 2  Total GAD 7 Score 14  Anxiety Difficulty Not difficult at all    Relevant past medical, surgical, family and social history reviewed and updated as indicated. Interim medical history since our last visit reviewed. Allergies and medications reviewed and updated.  Review of Systems  Constitutional: Negative for activity change, appetite change, diaphoresis, fatigue and fever.  Respiratory: Negative for cough, chest tightness and shortness of breath.   Cardiovascular: Negative for chest pain, palpitations and leg swelling.  Gastrointestinal: Negative.   Endocrine: Negative for cold intolerance and heat intolerance.  Neurological: Negative.   Psychiatric/Behavioral: Positive for decreased concentration. Negative for self-injury, sleep disturbance and suicidal ideas. The patient is not nervous/anxious.     Per HPI unless specifically indicated above     Objective:    BP 130/75   Pulse (!) 55   Temp 98.1 F (36.7 C) (Oral)   Wt 136 lb 3.2 oz (61.8 kg)   LMP  (LMP Unknown)   SpO2 98%   BMI 24.51 kg/m   Wt Readings from Last 3 Encounters:  10/07/19 136 lb 3.2 oz (61.8 kg)  09/07/19 139 lb 6.4 oz (63.2 kg)  07/26/19 141 lb 3.2 oz (64 kg)    Physical Exam Vitals and nursing note reviewed.  Constitutional:  General: She is awake. She is not in acute distress.    Appearance: She is well-developed and well-groomed. She is not ill-appearing.  HENT:     Head: Normocephalic.     Right Ear: Hearing normal.     Left Ear: Hearing normal.  Eyes:     General: Lids are normal.        Right eye: No discharge.        Left eye: No discharge.     Conjunctiva/sclera: Conjunctivae normal.     Pupils: Pupils are equal, round, and reactive to light.  Neck:     Thyroid: No thyromegaly.     Vascular: No carotid bruit.  Cardiovascular:     Rate and Rhythm: Normal rate and regular rhythm.     Heart sounds: Normal heart sounds. No murmur heard.  No gallop.   Pulmonary:      Effort: Pulmonary effort is normal. No accessory muscle usage or respiratory distress.     Breath sounds: Normal breath sounds.  Abdominal:     General: Bowel sounds are normal.     Palpations: Abdomen is soft.  Musculoskeletal:     Cervical back: Normal range of motion and neck supple.     Right lower leg: No edema.     Left lower leg: No edema.  Lymphadenopathy:     Cervical: No cervical adenopathy.  Skin:    General: Skin is warm and dry.  Neurological:     Mental Status: She is alert and oriented to person, place, and time.  Psychiatric:        Attention and Perception: Attention normal.        Mood and Affect: Mood normal.        Speech: Speech normal.        Behavior: Behavior normal. Behavior is cooperative.        Thought Content: Thought content normal.     Results for orders placed or performed in visit on 07/26/19  Sed Rate (ESR)  Result Value Ref Range   Sed Rate 6 0 - 40 mm/hr  C-reactive protein  Result Value Ref Range   CRP 3 0 - 10 mg/L  Basic metabolic panel  Result Value Ref Range   Glucose 89 65 - 99 mg/dL   BUN 13 8 - 27 mg/dL   Creatinine, Ser 0.75 0.57 - 1.00 mg/dL   GFR calc non Af Amer 81 >59 mL/min/1.73   GFR calc Af Amer 93 >59 mL/min/1.73   BUN/Creatinine Ratio 17 12 - 28   Sodium 141 134 - 144 mmol/L   Potassium 4.2 3.5 - 5.2 mmol/L   Chloride 102 96 - 106 mmol/L   CO2 24 20 - 29 mmol/L   Calcium 9.7 8.7 - 10.3 mg/dL  ANA w/Reflex if Positive  Result Value Ref Range   Anti Nuclear Antibody (ANA) Negative Negative  CK (Creatine Kinase)  Result Value Ref Range   Total CK 64 32.0 - 182.0 U/L      Assessment & Plan:   Problem List Items Addressed This Visit      Other   Major depression - Primary    Chronic, ongoing.  Denies SI/HI.  Suspect some of fatigue may be related to mood at this time, recommend continue therapy through her church for spiritual guidance. At this time will increase Sertraline to 100 MG daily, script sent.   Follow-up in 6 weeks, sooner if worsening mood.      Relevant Medications  sertraline (ZOLOFT) 100 MG tablet       Follow up plan: Return in about 6 weeks (around 11/18/2019) for Depression + recheck B12 level.

## 2019-11-18 ENCOUNTER — Ambulatory Visit: Payer: Medicare PPO | Admitting: Nurse Practitioner

## 2019-11-26 ENCOUNTER — Telehealth: Payer: Self-pay | Admitting: Nurse Practitioner

## 2019-11-26 NOTE — Telephone Encounter (Signed)
Called pt to r/s 9/27 appt, no answer, left vm

## 2019-11-29 ENCOUNTER — Ambulatory Visit: Payer: Medicare PPO | Admitting: Nurse Practitioner

## 2020-02-29 ENCOUNTER — Other Ambulatory Visit: Payer: Self-pay | Admitting: Nurse Practitioner

## 2020-03-08 ENCOUNTER — Encounter: Payer: Self-pay | Admitting: Nurse Practitioner

## 2020-03-08 ENCOUNTER — Ambulatory Visit (INDEPENDENT_AMBULATORY_CARE_PROVIDER_SITE_OTHER): Payer: Medicare PPO | Admitting: Nurse Practitioner

## 2020-03-08 VITALS — BP 143/79

## 2020-03-08 DIAGNOSIS — R7309 Other abnormal glucose: Secondary | ICD-10-CM | POA: Diagnosis not present

## 2020-03-08 DIAGNOSIS — E7800 Pure hypercholesterolemia, unspecified: Secondary | ICD-10-CM

## 2020-03-08 DIAGNOSIS — G43709 Chronic migraine without aura, not intractable, without status migrainosus: Secondary | ICD-10-CM

## 2020-03-08 DIAGNOSIS — E538 Deficiency of other specified B group vitamins: Secondary | ICD-10-CM

## 2020-03-08 NOTE — Progress Notes (Signed)
A1c 5.7% and urine ALB 10.  Discussed with patient.  Will review remaining of labs and if normal consider imaging next.  She is aware of plan.

## 2020-03-08 NOTE — Addendum Note (Signed)
Addended by: Enedina Finner on: 03/08/2020 01:49 PM   Modules accepted: Orders

## 2020-03-08 NOTE — Assessment & Plan Note (Signed)
History of migraines, but current HA she reports are different with more frontal aspect, presenting with fatigue, polyuria, and polydipsia.  Suspicion for diabetes present as last A1c 6.1% in April 2021.  Will have come in today for lab work up to include A1c, TSH, CMP, B12, and will check for tick borne disease.  After review of labs if normal levels present may consider MRI imaging brain due to fatigue and weakness/tingling present in feet/legs bilateral.  Denies amaurosis, diplopia, or abnormal speech.  Return in 2 weeks, sooner if worsening symptoms.  If any red flag symptoms present, she is immediately to go to ER.

## 2020-03-08 NOTE — Patient Instructions (Signed)

## 2020-03-08 NOTE — Assessment & Plan Note (Signed)
Last 6.1% in April 2021, at this time concern for elevation due to symptoms.  Will recheck labs today and initiate medication as needed based on findings.

## 2020-03-08 NOTE — Progress Notes (Signed)
BP (!) 143/79   LMP  (LMP Unknown)    Subjective:    Patient ID: Tabitha Larson, female    DOB: 04-18-1950, 70 y.o.   MRN: [id number]  HPI: Tabitha Larson is a 70 y.o. female  Chief Complaint  Patient presents with  . Headache    Patient states that she has been having all symptoms on and off for the past 3 months. She also states that she has been experiencing tingling in her feet, thirst, always hungry, frequency of urination, and craving sugar.  . Fatigue    . This visit was completed via telephone due to the restrictions of the COVID-19 pandemic. All issues as above were discussed and addressed but no physical exam was performed. If it was felt that the patient should be evaluated in the office, they were directed there. The patient verbally consented to this visit. Patient was unable to complete an audio/visual visit due to Technical difficulties,Lack of internet. Due to the catastrophic nature of the COVID-19 pandemic, this visit was done through audio contact only. . Location of the patient: home . Location of the provider: work . Those involved with this call:  . Provider: Marnee Guarneri, DNP . CMA: Yvonna Alanis, CMA . Front Desk/Registration: Jill Side  . Time spent on call: 21 minutes on the phone discussing health concerns. 15 minutes total spent in review of patient's record and preparation of their chart.  . I verified patient identity using two factors (patient name and date of birth). Patient consents verbally to being seen via telemedicine visit today.    Tabitha Larson is a 70 y.o. female who complains of headaches for 3 month(s) and weakness/tingling in legs with increased urination.  Describes these to frontal area of occiput.  Used to have migraines, but reports these are not exactly like migraines.  When she wakes up they are often worse.  Notices them on and off all day, but worse in morning when present.  In morning if HA present she feels  weakness in legs with feet tingling, but reports this weakness/tingling in feet is all the time as well.  Reports eating a lot of sugar and craving this over past months, 6.1% A1c last noted in April 2021. She does endorse some polyphagia and polyuria, but not polydipsia. Pain relief: ibuprofen. Precipitating factors: nothing she can think of.   She denies a history of recent head injury.  Prior neurological history: negative for migraine headaches. Neurologic Review of Systems - no TIA or stroke-like symptoms, no amaurosis, diplopia, abnormal speech, unilateral numbness or weakness. Severity: 8/10 Quality: dull, aching and throbbing Frequency: intermittent Location: frontal Headache duration: 1 hour or less -- once drinks coffee they go away Radiation: no Time of day headache occurs: varies Alleviating factors: Ibuprofen Aggravating factors: unknown Headache status at time of visit: asymptomatic Treatments attempted: Treatments attempted: ibuprofen   Aura: no Nausea:  yes Vomiting: no Photophobia:  no Phonophobia:  no Effect on social functioning:  yes Numbers of missed days of school/work each month: none Confusion:  no Gait disturbance/ataxia:  no Behavioral changes:  no Fevers:  no  Relevant past medical, surgical, family and social history reviewed and updated as indicated. Interim medical history since our last visit reviewed. Allergies and medications reviewed and updated.  Review of Systems  Constitutional: Positive for fatigue. Negative for activity change, appetite change, diaphoresis and fever.  Respiratory: Negative for cough, chest tightness and shortness of breath.   Cardiovascular: Negative  for chest pain, palpitations and leg swelling.  Gastrointestinal: Negative.   Endocrine: Positive for polyphagia and polyuria. Negative for cold intolerance, heat intolerance and polydipsia.  Neurological: Positive for weakness and headaches. Negative for dizziness, tremors,  seizures, facial asymmetry, speech difficulty, light-headedness and numbness.  Psychiatric/Behavioral: Negative.     Per HPI unless specifically indicated above     Objective:    BP (!) 143/79   LMP  (LMP Unknown)   Wt Readings from Last 3 Encounters:  10/07/19 136 lb 3.2 oz (61.8 kg)  09/07/19 139 lb 6.4 oz (63.2 kg)  07/26/19 141 lb 3.2 oz (64 kg)    Physical Exam   Unable to perform due to telephone visit only.  Results for orders placed or performed in visit on 07/26/19  Sed Rate (ESR)  Result Value Ref Range   Sed Rate 6 0 - 40 mm/hr  C-reactive protein  Result Value Ref Range   CRP 3 0 - 10 mg/L  Basic metabolic panel  Result Value Ref Range   Glucose 89 65 - 99 mg/dL   BUN 13 8 - 27 mg/dL   Creatinine, Ser 0.75 0.57 - 1.00 mg/dL   GFR calc non Af Amer 81 >59 mL/min/1.73   GFR calc Af Amer 93 >59 mL/min/1.73   BUN/Creatinine Ratio 17 12 - 28   Sodium 141 134 - 144 mmol/L   Potassium 4.2 3.5 - 5.2 mmol/L   Chloride 102 96 - 106 mmol/L   CO2 24 20 - 29 mmol/L   Calcium 9.7 8.7 - 10.3 mg/dL  ANA w/Reflex if Positive  Result Value Ref Range   Anti Nuclear Antibody (ANA) Negative Negative  CK (Creatine Kinase)  Result Value Ref Range   Total CK 64 32 - 182 U/L      Assessment & Plan:   Problem List Items Addressed This Visit      Cardiovascular and Mediastinum   Headache, migraine - Primary    History of migraines, but current HA she reports are different with more frontal aspect, presenting with fatigue, polyuria, and polydipsia.  Suspicion for diabetes present as last A1c 6.1% in April 2021.  Will have come in today for lab work up to include A1c, TSH, CMP, B12, and will check for tick borne disease.  After review of labs if normal levels present may consider MRI imaging brain due to fatigue and weakness/tingling present in feet/legs bilateral.  Denies amaurosis, diplopia, or abnormal speech.  Return in 2 weeks, sooner if worsening symptoms.  If any red flag  symptoms present, she is immediately to go to ER.      Relevant Orders   TSH   CBC with Differential/Platelet   B. burgdorfi antibodies   Lyme disease, western blot   Comprehensive metabolic panel     Other   Hypercholesterolemia    Chronic, ongoing.  Continue current medication regimen and adjust as needed. Lipid panel and CMP today.        Relevant Orders   Lipid Panel w/o Chol/HDL Ratio   Elevated hemoglobin A1c    Last 6.1% in April 2021, at this time concern for elevation due to symptoms.  Will recheck labs today and initiate medication as needed based on findings.      Relevant Orders   Bayer DCA Hb A1c Waived   Microalbumin, Urine Waived   B12 deficiency    Continue daily supplement and recheck B12 level today.      Relevant Orders   Vitamin  B12      I discussed the assessment and treatment plan with the patient. The patient was provided an opportunity to ask questions and all were answered. The patient agreed with the plan and demonstrated an understanding of the instructions.   The patient was advised to call back or seek an in-person evaluation if the symptoms worsen or if the condition fails to improve as anticipated.   I provided 21+ minutes of time during this encounter.  Follow up plan: Return in about 2 weeks (around 03/22/2020) for Headache and fatigue.

## 2020-03-08 NOTE — Assessment & Plan Note (Signed)
Chronic, ongoing.  Continue current medication regimen and adjust as needed.  Lipid panel and CMP today.    

## 2020-03-08 NOTE — Assessment & Plan Note (Signed)
Continue daily supplement and recheck B12 level today.

## 2020-03-09 LAB — MICROALBUMIN, URINE WAIVED
Creatinine, Urine Waived: 50 mg/dL (ref 10–300)
Microalb, Ur Waived: 10 mg/L (ref 0–19)
Microalb/Creat Ratio: <30 mg/g (ref <30)

## 2020-03-09 LAB — BAYER DCA HB A1C WAIVED: HB A1C (BAYER DCA - WAIVED): 5.7 % (ref <7.0)

## 2020-03-09 NOTE — Progress Notes (Signed)
Please let Ahlia know I am waiting on one more lab to return, but at this time majority of labs have returned normal with exception of B12 level remaining on low side of normal at 372, we like above 300.  I would recommend she take Vitamin B12 1000 MCG daily as this is good for nervous system health, including nerve pain or brain fog/fatigue.  Once last lab returns, I checked to ensure no tick borne illness and am waiting on this, if normal we may consider imaging to further check.  Any questions? Keep being awesome!!  Thank you for allowing me to participate in your care. Kindest regards, Esiah Bazinet

## 2020-03-10 ENCOUNTER — Ambulatory Visit: Payer: Self-pay | Admitting: *Deleted

## 2020-03-10 LAB — CBC WITH DIFFERENTIAL/PLATELET
Basophils Absolute: 0 10*3/uL (ref 0.0–0.2)
Basos: 1 %
EOS (ABSOLUTE): 0.1 10*3/uL (ref 0.0–0.4)
Eos: 1 %
Hematocrit: 43.3 % (ref 34.0–46.6)
Hemoglobin: 14.2 g/dL (ref 11.1–15.9)
Immature Grans (Abs): 0 10*3/uL (ref 0.0–0.1)
Immature Granulocytes: 0 %
Lymphocytes Absolute: 2 10*3/uL (ref 0.7–3.1)
Lymphs: 29 %
MCH: 28.1 pg (ref 26.6–33.0)
MCHC: 32.8 g/dL (ref 31.5–35.7)
MCV: 86 fL (ref 79–97)
Monocytes Absolute: 0.5 10*3/uL (ref 0.1–0.9)
Monocytes: 7 %
Neutrophils Absolute: 4.2 10*3/uL (ref 1.4–7.0)
Neutrophils: 62 %
Platelets: 214 10*3/uL (ref 150–450)
RBC: 5.05 x10E6/uL (ref 3.77–5.28)
RDW: 14.5 % (ref 11.7–15.4)
WBC: 6.7 10*3/uL (ref 3.4–10.8)

## 2020-03-10 LAB — VITAMIN B12: Vitamin B-12: 372 pg/mL (ref 232–1245)

## 2020-03-10 LAB — LYME DISEASE, WESTERN BLOT
IgG P18 Ab.: ABSENT
IgG P23 Ab.: ABSENT
IgG P28 Ab.: ABSENT
IgG P30 Ab.: ABSENT
IgG P39 Ab.: ABSENT
IgG P45 Ab.: ABSENT
IgG P58 Ab.: ABSENT
IgG P66 Ab.: ABSENT
IgG P93 Ab.: ABSENT
IgM P23 Ab.: ABSENT
IgM P39 Ab.: ABSENT
IgM P41 Ab.: ABSENT
Lyme IgG Wb: NEGATIVE
Lyme IgM Wb: NEGATIVE

## 2020-03-10 LAB — LIPID PANEL W/O CHOL/HDL RATIO
Cholesterol, Total: 172 mg/dL (ref 100–199)
HDL: 43 mg/dL (ref >39)
LDL Chol Calc (NIH): 101 mg/dL — ABNORMAL HIGH (ref 0–99)
Triglycerides: 159 mg/dL — ABNORMAL HIGH (ref 0–149)
VLDL Cholesterol Cal: 28 mg/dL (ref 5–40)

## 2020-03-10 LAB — COMPREHENSIVE METABOLIC PANEL
ALT: 17 IU/L (ref 0–32)
AST: 14 IU/L (ref 0–40)
Albumin/Globulin Ratio: 1.6 (ref 1.2–2.2)
Albumin: 4.5 g/dL (ref 3.7–4.7)
Alkaline Phosphatase: 80 IU/L (ref 44–121)
BUN/Creatinine Ratio: 23 (ref 12–28)
BUN: 15 mg/dL (ref 8–27)
Bilirubin Total: 0.3 mg/dL (ref 0.0–1.2)
CO2: 21 mmol/L (ref 20–29)
Calcium: 9.6 mg/dL (ref 8.7–10.3)
Chloride: 102 mmol/L (ref 96–106)
Creatinine, Ser: 0.66 mg/dL (ref 0.57–1.00)
GFR calc Af Amer: 103 mL/min/{1.73_m2} (ref >59)
GFR calc non Af Amer: 89 mL/min/{1.73_m2} (ref >59)
Globulin, Total: 2.9 g/dL (ref 1.5–4.5)
Glucose: 77 mg/dL (ref 65–99)
Potassium: 4 mmol/L (ref 3.5–5.2)
Sodium: 139 mmol/L (ref 134–144)
Total Protein: 7.4 g/dL (ref 6.0–8.5)

## 2020-03-10 LAB — TSH: TSH: 2.84 u[IU]/mL (ref 0.450–4.500)

## 2020-03-10 LAB — B. BURGDORFI ANTIBODIES: Lyme IgG/IgM Ab: <0.91 {ISR} (ref 0.00–0.90)

## 2020-03-10 NOTE — Telephone Encounter (Signed)
Attempted to contact patient call went dead No answer not able to leave message.

## 2020-03-10 NOTE — Telephone Encounter (Signed)
Calling for lab results: She also needs 2 weeks follow-up scheduled please   Venita Lick, NP  03/09/2020 2:23 PM EST      Please let Tabitha Larson know I am waiting on one more lab to return, but at this time majority of labs have returned normal with exception of B12 level remaining on low side of normal at 372, we like above 300. I would recommend she take Vitamin B12 1000 MCG daily as this is good for nervous system health, including nerve pain or brain fog/fatigue. Once last lab returns, I checked to ensure no tick borne illness and am waiting on this, if normal we may consider imaging to further check. Any questions? Keep being awesome!! Thank you for allowing me to participate in your care. Kindest regards, Jolene  Attempted to call patient- left message on voice mail to call back- message in lab result basket too.

## 2020-03-13 NOTE — Progress Notes (Signed)
Patient notified

## 2020-03-21 ENCOUNTER — Encounter: Payer: Self-pay | Admitting: Nurse Practitioner

## 2020-03-21 ENCOUNTER — Ambulatory Visit: Payer: Medicare PPO | Admitting: Nurse Practitioner

## 2020-03-21 ENCOUNTER — Other Ambulatory Visit: Payer: Self-pay

## 2020-03-21 VITALS — BP 117/71 | HR 89 | Temp 98.1°F | Ht 61.02 in | Wt 140.6 lb

## 2020-03-21 DIAGNOSIS — R0989 Other specified symptoms and signs involving the circulatory and respiratory systems: Secondary | ICD-10-CM | POA: Insufficient documentation

## 2020-03-21 DIAGNOSIS — F332 Major depressive disorder, recurrent severe without psychotic features: Secondary | ICD-10-CM

## 2020-03-21 DIAGNOSIS — R531 Weakness: Secondary | ICD-10-CM

## 2020-03-21 DIAGNOSIS — G250 Essential tremor: Secondary | ICD-10-CM | POA: Diagnosis not present

## 2020-03-21 DIAGNOSIS — G43709 Chronic migraine without aura, not intractable, without status migrainosus: Secondary | ICD-10-CM | POA: Diagnosis not present

## 2020-03-21 DIAGNOSIS — Z23 Encounter for immunization: Secondary | ICD-10-CM

## 2020-03-21 MED ORDER — PROPRANOLOL HCL 10 MG PO TABS: 10.0000 mg | ORAL_TABLET | Freq: Every day | ORAL | 4 refills | Status: DC

## 2020-03-21 NOTE — Progress Notes (Signed)
BP 117/71   Pulse 89   Temp 98.1 F (36.7 C) (Oral)   Ht 5' 1.02" (1.55 m)   Wt 140 lb 9.6 oz (63.8 kg)   LMP  (LMP Unknown)   SpO2 98%   BMI 26.55 kg/m    Subjective:    Patient ID: Tabitha Larson, female    DOB: 03-20-1950, 70 y.o.   MRN: [id number]  HPI: Tabitha Larson is a 70 y.o. female  Chief Complaint  Patient presents with  . Headache    Review recent lab work, Patient also states that she is having some nausea today associated with her current headache    HEADACHES: Tabitha Larson is a 70 y.o. female who complains of headaches for 3 month(s) and weakness/tingling in legs -- follow-up today from visit virtual on 03/08/20.  Describes these to frontal area of occiput.  Used to have migraines, but reports these are not exactly like migraines -- used to wake up with migraines and had nausea -- were excruciating, these are not similar.  When she wakes up they are often worse.  Notices them on and off all day, but worse in morning when present.  In morning if HA present she feels weakness in legs with feet tingling, but reports this weakness/tingling in feet is all the time as well. Reports eating a lot of sugar and craving this over past months, with recent A1c 5.7%, lower then previous of 6.1%. She does endorse some polyphagia and polyuria, but not polydipsia. Pain relief: Ibuprofen and a cup of coffee. Precipitating factors: nothing she can think of.   She denies a history of recent head injury.   Recent labs on 03/08/20 overall reassuring, including tick borne disease labs.  B12 continues on lower side of normal at 372 -- has been taking supplement at times, but often forgets.  Has not eye exam in 3 years -- [facility] -- does endorse changes in reading abilities.  Does have ongoing fatigue and concentration issues.  Has had essential tremor bilaterally since childhood -- sister has it as well and takes medication.  Has history of smoking. Prior neurological history: migraine  headaches when younger -- with age these improved Neurologic Review of Systems - no TIA or stroke-like symptoms, no amaurosis, diplopia, abnormal speech, unilateral numbness or weakness. Severity: 3-4/10 at best -- occasionally 8/10 Quality: dull, aching and throbbing Frequency: intermittent Location: frontal Headache duration: 1 hour or less -- once drinks coffee they go away Radiation: no Time of day headache occurs: varies Alleviating factors: Ibuprofen Aggravating factors: unknown Headache status at time of visit: mild at this time, dull and aching Treatments attempted: Treatments attempted: ibuprofen and caffeine  Aura: no Nausea:  yes, occasional Vomiting: no Photophobia:  no Phonophobia:  no Effect on social functioning:  yes Numbers of missed days of school/work each month: none Confusion:  no Gait disturbance/ataxia:  no Behavioral changes:  no Fevers:  no Depression screen [employer] 2/9 03/21/2020 03/08/2020 10/07/2019 09/07/2019 07/26/2019  Decreased Interest 0 0 1 0 0  Down, Depressed, Hopeless 0 1 1 2 1   PHQ - 2 Score 0 1 2 2 1   Altered sleeping - 0 1 0 0  Tired, decreased energy - 2 1 2 3   Change in appetite - 0 3 0 1  Feeling bad or failure about yourself  - 3 2 1 1   Trouble concentrating - - 2 1 1   Moving slowly or fidgety/restless - 2 0 0 0  Suicidal thoughts - 0 0 0 0  PHQ-9 Score - 8 11 6 7   Difficult doing work/chores - - Not difficult at all Somewhat difficult Somewhat difficult  Some recent data might be hidden    Relevant past medical, surgical, family and social history reviewed and updated as indicated. Interim medical history since our last visit reviewed. Allergies and medications reviewed and updated.  Review of Systems  Constitutional: Positive for fatigue. Negative for activity change, appetite change, diaphoresis and fever.  Respiratory: Negative for cough, chest tightness and shortness of breath.   Cardiovascular: Negative for chest pain, palpitations  and leg swelling.  Gastrointestinal: Negative.   Endocrine: Positive for polyphagia and polyuria. Negative for cold intolerance, heat intolerance and polydipsia.  Neurological: Positive for weakness and headaches. Negative for dizziness, tremors, seizures, facial asymmetry, speech difficulty, light-headedness and numbness.  Psychiatric/Behavioral: Negative.     Per HPI unless specifically indicated above     Objective:    BP 117/71   Pulse 89   Temp 98.1 F (36.7 C) (Oral)   Ht 5' 1.02" (1.55 m)   Wt 140 lb 9.6 oz (63.8 kg)   LMP  (LMP Unknown)   SpO2 98%   BMI 26.55 kg/m   Wt Readings from Last 3 Encounters:  03/21/20 140 lb 9.6 oz (63.8 kg)  10/07/19 136 lb 3.2 oz (61.8 kg)  09/07/19 139 lb 6.4 oz (63.2 kg)    Physical Exam Vitals and nursing note reviewed.  Constitutional:      General: She is awake. She is not in acute distress.    Appearance: She is well-developed and well-groomed. She is not ill-appearing.  HENT:     Head: Normocephalic.     Right Ear: Hearing normal.     Left Ear: Hearing normal.  Eyes:     General: Lids are normal.        Right eye: No discharge.        Left eye: No discharge.     Conjunctiva/sclera: Conjunctivae normal.     Pupils: Pupils are equal, round, and reactive to light.  Neck:     Thyroid: No thyromegaly.     Vascular: Carotid bruit (right side noted, left clear) present.  Cardiovascular:     Rate and Rhythm: Normal rate and regular rhythm.     Chest Wall: PMI is not displaced.     Heart sounds: Normal heart sounds. No murmur heard. No gallop.   Pulmonary:     Effort: Pulmonary effort is normal. No accessory muscle usage or respiratory distress.     Breath sounds: Normal breath sounds.  Abdominal:     General: Bowel sounds are normal.     Palpations: Abdomen is soft.  Musculoskeletal:     Cervical back: Normal range of motion and neck supple.     Right lower leg: No edema.     Left lower leg: No edema.  Lymphadenopathy:      Cervical: No cervical adenopathy.  Skin:    General: Skin is warm and dry.  Neurological:     Mental Status: She is alert and oriented to person, place, and time.     Cranial Nerves: Cranial nerves are intact.     Motor: Weakness (mild to bilateral upper and lower extremities) and tremor present.     Coordination: Coordination is intact.     Gait: Gait is intact.     Deep Tendon Reflexes: Reflexes are normal and symmetric.     Reflex Scores:  Brachioradialis reflexes are 2+ on the right side and 2+ on the left side.      Patellar reflexes are 2+ on the right side and 2+ on the left side.    Comments: Tremor noted bilateral upper extremities with no cogwheel or rigidity noted.  Psychiatric:        Attention and Perception: Attention normal.        Mood and Affect: Mood normal.        Speech: Speech normal.        Behavior: Behavior normal. Behavior is cooperative.        Thought Content: Thought content normal.     Results for orders placed or performed in visit on 03/08/20  Comprehensive metabolic panel  Result Value Ref Range   Glucose 77 65 - 99 mg/dL   BUN 15 8 - 27 mg/dL   Creatinine, Ser 0.66 0.57 - 1.00 mg/dL   GFR calc non Af Amer 89 >59 mL/min/1.73   GFR calc Af Amer 103 >59 mL/min/1.73   BUN/Creatinine Ratio 23 12 - 28   Sodium 139 134 - 144 mmol/L   Potassium 4.0 3.5 - 5.2 mmol/L   Chloride 102 96 - 106 mmol/L   CO2 21 20 - 29 mmol/L   Calcium 9.6 8.7 - 10.3 mg/dL   Total Protein 7.4 6.0 - 8.5 g/dL   Albumin 4.5 3.7 - 4.7 g/dL   Globulin, Total 2.9 1.5 - 4.5 g/dL   Albumin/Globulin Ratio 1.6 1.2 - 2.2   Bilirubin Total 0.3 0.0 - 1.2 mg/dL   Alkaline Phosphatase 80 44 - 121 IU/L   AST 14 0 - 40 IU/L   ALT 17 0 - 32 IU/L  Lyme disease, western blot  Result Value Ref Range     IgG P93 Ab. Absent      IgG P66 Ab. Absent      IgG P58 Ab. Absent      IgG P45 Ab. Absent      IgG P41 Ab. Present (A)      IgG P39 Ab. Absent      IgG P30 Ab. Absent      IgG  P28 Ab. Absent      IgG P23 Ab. Absent      IgG P18 Ab. Absent    Lyme IgG Wb Negative      IgM P41 Ab. Absent      IgM P39 Ab. Absent      IgM P23 Ab. Absent    Lyme IgM Wb Negative   B. burgdorfi antibodies  Result Value Ref Range   Lyme IgG/IgM Ab <0.91 0.00 - 0.90 ISR  CBC with Differential/Platelet  Result Value Ref Range   WBC 6.7 3.4 - 10.8 x10E3/uL   RBC 5.05 3.77 - 5.28 x10E6/uL   Hemoglobin 14.2 11.1 - 15.9 g/dL   Hematocrit 43.3 34.0 - 46.6 %   MCV 86 79 - 97 fL   MCH 28.1 26.6 - 33.0 pg   MCHC 32.8 31.5 - 35.7 g/dL   RDW 14.5 11.7 - 15.4 %   Platelets 214 150 - 450 x10E3/uL   Neutrophils 62 Not Estab. %   Lymphs 29 Not Estab. %   Monocytes 7 Not Estab. %   Eos 1 Not Estab. %   Basos 1 Not Estab. %   Neutrophils Absolute 4.2 1.4 - 7.0 x10E3/uL   Lymphocytes Absolute 2.0 0.7 - 3.1 x10E3/uL   Monocytes Absolute 0.5 0.1 - 0.9 x10E3/uL   EOS (  ABSOLUTE) 0.1 0.0 - 0.4 x10E3/uL   Basophils Absolute 0.0 0.0 - 0.2 x10E3/uL   Immature Granulocytes 0 Not Estab. %   Immature Grans (Abs) 0.0 0.0 - 0.1 x10E3/uL  Vitamin B12  Result Value Ref Range   Vitamin B-12 372 232 - 1,245 pg/mL  TSH  Result Value Ref Range   TSH 2.840 0.450 - 4.500 uIU/mL  Lipid Panel w/o Chol/HDL Ratio  Result Value Ref Range   Cholesterol, Total 172 100 - 199 mg/dL   Triglycerides 159 (H) 0 - 149 mg/dL   HDL 43 >39 mg/dL   VLDL Cholesterol Cal 28 5 - 40 mg/dL   LDL Chol Calc (NIH) 101 (H) 0 - 99 mg/dL  Microalbumin, Urine Waived  Result Value Ref Range   Microalb, Ur Waived 10 0 - 19 mg/L   Creatinine, Urine Waived 50 10 - 300 mg/dL   Microalb/Creat Ratio <30 <30 mg/g  Bayer DCA Hb A1c Waived  Result Value Ref Range   HB A1C (BAYER DCA - WAIVED) 5.7 <7.0 %      Assessment & Plan:   Problem List Items Addressed This Visit      Cardiovascular and Mediastinum   Headache, migraine    History of migraines, but current HA she reports are different with more frontal aspect, presenting with  fatigue and some bilateral lower extremity weakness. No amaurosis, diplopia, unilateral numbness/weakness or abnormal speech.  Recent labs unremarkable.  On assessment today right carotid bruit noted, will obtain urgent carotid doppler (scheduled for tomorrow morning) and recommend patient continue statin and start Baby ASA daily.  Obtain MRI brain for further evaluation due to new onset of headaches.  Plan for vascular or neurology referral upon review of imaging.  Return in 2 weeks, sooner if worsening symptoms.  If any red flag symptoms present, she is immediately to go to ER -- patient agrees with this plan.      Relevant Medications   propranolol (INDERAL) 10 MG tablet   Other Relevant Orders   US Carotid Duplex Bilateral   MR Brain W Wo Contrast     Nervous and Auditory   Essential tremor    Ongoing since childhood, familial, sister has as well.  Will start a low dose Propranolol 10 MG daily which may also benefit HA with history of migraines, script sent and educated patient on this.  Obtain MRI brain for further evaluation, refer to headache plan for further.  Return to office in 2 weeks.        Other   Major depression - Primary    Chronic, ongoing.  Denies SI/HI.  Suspect some of fatigue may be related to mood at this time, recommend continue therapy through her church for spiritual guidance. Continue Sertraline 100 MG daily, refills as needed.  Follow-up in 6 months for mood, sooner if worsening.      Weakness    Ongoing with new onset headaches -- no current red flag symptoms.  Weakness is bilateral lower extremity per patient, no unilateral.  Recommend she take Vitamin B12 consistently daily, as levels low normal.  Will obtain MRI brain for further evaluation.  Refer to headache plan for further.   If ongoing discussed with her pursuing CT scan of lungs, due to past history of smoking + repeat colonoscopy.  Return in 2 weeks, sooner if worsening.      Relevant Orders   MR Brain  W Wo Contrast   Right carotid bruit    Noted on  exam today, will obtain urgent imaging doppler -- scheduled for tomorrow morning.  Recommend she start Baby ASA 81 MG daily and continue statin daily.  Refer to headache plan for further.      Relevant Orders   US Carotid Duplex Bilateral   MR Brain W Wo Contrast    Other Visit Diagnoses    Need for influenza vaccination       Flu vaccine today   Relevant Orders   Flu Vaccine QUAD High Dose(Fluad)       Follow up plan: Return in about 2 weeks (around 04/04/2020) for Headache.

## 2020-03-21 NOTE — Assessment & Plan Note (Addendum)
Ongoing with new onset headaches -- no current red flag symptoms.  Weakness is bilateral lower extremity per patient, no unilateral.  Recommend she take Vitamin B12 consistently daily, as levels low normal.  Will obtain MRI brain for further evaluation.  Refer to headache plan for further.   If ongoing discussed with her pursuing CT scan of lungs, due to past history of smoking + repeat colonoscopy.  Return in 2 weeks, sooner if worsening.

## 2020-03-21 NOTE — Assessment & Plan Note (Signed)
Noted on exam today, will obtain urgent imaging doppler -- scheduled for tomorrow morning.  Recommend she start Baby ASA 81 MG daily and continue statin daily.  Refer to headache plan for further.

## 2020-03-21 NOTE — Assessment & Plan Note (Addendum)
History of migraines, but current HA she reports are different with more frontal aspect, presenting with fatigue and some bilateral lower extremity weakness. No amaurosis, diplopia, unilateral numbness/weakness or abnormal speech.  Recent labs unremarkable.  On assessment today right carotid bruit noted, will obtain urgent carotid doppler (scheduled for tomorrow morning) and recommend patient continue statin and start Baby ASA daily.  Obtain MRI brain for further evaluation due to new onset of headaches.  Plan for vascular or neurology referral upon review of imaging.  Return in 2 weeks, sooner if worsening symptoms.  If any red flag symptoms present, she is immediately to go to ER -- patient agrees with this plan.

## 2020-03-21 NOTE — Patient Instructions (Signed)
Chronic Migraine Headache A migraine headache is throbbing pain that is usually on one side of the head. Migraines that keep coming back are called recurring migraines. A migraine is called a chronic migraine if it happens at least 15 days in a month for more than 3 months. Talk with your doctor about what things may bring on (trigger) your migraines. What are the causes? The exact cause of this condition is not known. A migraine may be caused when nerves in the brain become irritated and release chemicals that cause irritation and swelling (inflammation) of blood vessels. The irritation and swelling of the blood vessels causes pain. Migraines may be brought on or caused by:  Smoking.  Foods and drinks, such as: ? Cheese. ? Chocolate. ? Alcohol. ? Caffeine.  Certain substances in some foods or drinks.  Some medicines. Other things that may bring on a migraine include:  Periods, for women.  Stress.  Not enough sleep or too much sleep.  Feeling very tired.  Bright lights or loud noises.  Smells  Weather changes and being at high altitude. What increases the risk? The following factors may make you more likely to have chronic migraine:  Having migraines or family members who have them.  Being very sad (depressed) or feeling worried or nervous (anxious).  Taking a lot of pain medicine.  Having problems sleeping.  Having heart disease, diabetes, or being very overweight (obese). What are the signs or symptoms? Symptoms of this condition include:  Pain that feels like it throbs.  Pain that is usually only on one side of the head. In some cases, the pain may be on both sides of the head or around the head or neck.  Very bad pain that keeps you from doing daily activities.  Pain that gets worse with activity.  Feeling like you may vomit (feeling nauseous) or vomiting.  Pain when you are around bright lights, loud noises, or activity.  Being sensitive to bright  lights, loud noises, or smells.  Feeling dizzy. How is this treated? This condition is treated with:  Medicines. These help to: ? Lessen pain and the feeling like you may vomit. ? Prevent migraines.  Changes to your diet or sleep.  Therapy. This might include: ? Relaxation training. ? Biofeedback. This is a treatment that teaches you to relax, use your brain to lower your heart rate, and control your breathing. ? Cognitive behavioral therapy (CBT). This therapy helps you set goals and follow up on the changes that you make.  Acupuncture.  Using a device that provides electrical stimulation to your nerves, which can help take away pain.  Surgery, if the other treatments do not work. Follow these instructions at home: Medicines  Take over-the-counter and prescription medicines only as told by your doctor.  Ask your doctor if the medicine prescribed to you requires you to avoid driving or using machinery. Lifestyle  Do not use any products that contain nicotine or tobacco, such as cigarettes, e-cigarettes, and chewing tobacco. If you need help quitting, ask your doctor.  Do not drink alcohol.  Get 7-9 hours of sleep each night.  Lower the stress in your life. Ask your doctor about ways to do this.  Stay at a healthy weight. Talk with your doctor if you need help losing weight.  Get regular exercise.   General instructions  Keep a journal to find out if certain things bring on migraines. For example, write down: ? What you eat and drink. ? How   much sleep you get. ? Any change to your diet or medicines.  Lie down in a dark, quiet room when you have a migraine.  Try placing a cool towel over your head when you have a migraine.  Keep lights dim if bright lights bother you or make your migraines worse.  Keep all follow-up visits as told by your doctor. This is important.   Where to find more information  Coalition for Headache and Migraine Patients (CHAMP):  headachemigraine.org  American Migraine Foundation: americanmigrainefoundation.org  National Headache Foundation: headaches.org Contact a doctor if:  Medicine does not help your migraine.  Your pain keeps coming back. Get help right away if:  Your migraine becomes really bad and medicine does not help.  You have a stiff neck and fever.  You have trouble seeing.  Your muscles are weak or you lose control of them.  You lose your balance or have trouble walking.  You feel like you will faint or you faint.  You start having sudden, very bad headaches.  You have a seizure. Summary  A migraine headache is very bad, throbbing pain that is usually on one side of the head.  A chronic migraine is a migraine that happens 15 days in a month for more than 3 months.  Talk with your doctor about what things may bring on your migraines.  Lie down in a dark, quiet room when you have a migraine.  Keep a journal. This can help you find out if certain things make you have migraines. This information is not intended to replace advice given to you by your health care provider. Make sure you discuss any questions you have with your health care provider. Document Revised: 04/07/2019 Document Reviewed: 04/07/2019 Elsevier Patient Education  2021 Elsevier Inc.  

## 2020-03-21 NOTE — Assessment & Plan Note (Addendum)
Ongoing since childhood, familial, sister has as well.  Will start a low dose Propranolol 10 MG daily which may also benefit HA with history of migraines, script sent and educated patient on this.  Obtain MRI brain for further evaluation, refer to headache plan for further.  Return to office in 2 weeks.

## 2020-03-21 NOTE — Assessment & Plan Note (Signed)
Chronic, ongoing.  Denies SI/HI.  Suspect some of fatigue may be related to mood at this time, recommend continue therapy through her church for spiritual guidance. Continue Sertraline 100 MG daily, refills as needed.  Follow-up in 6 months for mood, sooner if worsening.

## 2020-03-22 ENCOUNTER — Ambulatory Visit
Admission: RE | Admit: 2020-03-22 | Discharge: 2020-03-22 | Disposition: A | Discharge status: Home / Self Care | Payer: Medicare PPO | Source: Ambulatory Visit | Attending: Nurse Practitioner | Admitting: Nurse Practitioner

## 2020-03-22 DIAGNOSIS — R0989 Other specified symptoms and signs involving the circulatory and respiratory systems: Secondary | ICD-10-CM | POA: Insufficient documentation

## 2020-03-22 DIAGNOSIS — G43709 Chronic migraine without aura, not intractable, without status migrainosus: Secondary | ICD-10-CM | POA: Diagnosis present

## 2020-03-22 NOTE — Progress Notes (Signed)
Please let Tabitha Larson know her imaging returned and did show moderate plaque build up bilaterally in carotid arteries, no stenosis (narrowing) of arteries or blockage.  This is good news.  I recommend we continue Atorvastatin daily and ensure LDL remains less then 70 to prevent further build-up and blockages + prevent stroke.  We will see what MRI brain shows, which I see is scheduled for end of January.  If any questions let me know.  Have a wonderful day!! Keep being awesome!!  Thank you for allowing me to participate in your care. Kindest regards, Vernica Wachtel

## 2020-03-29 ENCOUNTER — Telehealth: Payer: Self-pay | Admitting: Nurse Practitioner

## 2020-03-29 MED ORDER — ALPRAZOLAM 0.25 MG PO TABS: ORAL_TABLET | ORAL | 0 refills | Status: DC

## 2020-03-29 NOTE — Telephone Encounter (Signed)
Called pt and advised per Jolene xanax has been sent to the pharmacy, pt verbalized understanding

## 2020-03-29 NOTE — Telephone Encounter (Signed)
Please advise pt last seen 1/18 mri scheduled for tomorrow

## 2020-03-29 NOTE — Addendum Note (Signed)
Addended by: Marnee Guarneri T on: 03/29/2020 08:14 AM   Modules accepted: Orders

## 2020-03-29 NOTE — Telephone Encounter (Signed)
Patient called to ask the doctor if she could send something to the pharmacy to calm her during her MRI.  She stated that she is claustrophobic and would do better with some meds to calm her during the procedure.  Please advise and call patient to confirm.  CB# [phone]

## 2020-03-29 NOTE — Telephone Encounter (Signed)
Have sent in Xanax prescription to help with nerves during procedure.

## 2020-03-30 ENCOUNTER — Ambulatory Visit
Admission: RE | Admit: 2020-03-30 | Discharge: 2020-03-30 | Disposition: A | Discharge status: Home / Self Care | Payer: Medicare PPO | Source: Ambulatory Visit | Attending: Nurse Practitioner | Admitting: Nurse Practitioner

## 2020-03-30 ENCOUNTER — Other Ambulatory Visit: Payer: Self-pay

## 2020-03-30 DIAGNOSIS — G43709 Chronic migraine without aura, not intractable, without status migrainosus: Secondary | ICD-10-CM | POA: Diagnosis not present

## 2020-03-30 DIAGNOSIS — R531 Weakness: Secondary | ICD-10-CM | POA: Diagnosis present

## 2020-03-30 DIAGNOSIS — R0989 Other specified symptoms and signs involving the circulatory and respiratory systems: Secondary | ICD-10-CM | POA: Diagnosis present

## 2020-03-30 MED ORDER — GADOBUTROL 1 MMOL/ML IV SOLN
6.0000 mL | Freq: Once | INTRAVENOUS | Status: AC | PRN
  Administered 2020-03-30: 6 mL via INTRAVENOUS

## 2020-03-30 NOTE — Progress Notes (Signed)
Good afternoon, please let Tabitha Larson know her imaging has returned and no bleeding, stroke, or masses noted.  She does have some chronic vascular which are common with aging on imaging.  One thing that was noted is some inflammatory sinus disease, worse to the left side.  This may be some of the issue with her headaches.  She may benefit from a visit with the ear, nose, and throat providers to further assess sinuses and obtain recommendations for this.  Would she like to pursue this referral?  If so I can place.  Thank you.  Have a great day!! Keep being awesome!!  Thank you for allowing me to participate in your care. Kindest regards, Laraine Samet

## 2020-04-06 ENCOUNTER — Ambulatory Visit: Payer: Medicare PPO | Admitting: Nurse Practitioner

## 2020-04-17 ENCOUNTER — Ambulatory Visit: Payer: Medicare PPO | Admitting: Nurse Practitioner

## 2020-04-17 ENCOUNTER — Encounter: Payer: Self-pay | Admitting: Nurse Practitioner

## 2020-04-17 ENCOUNTER — Other Ambulatory Visit: Payer: Self-pay

## 2020-04-17 VITALS — BP 108/69 | HR 62 | Temp 98.1°F | Ht 61.1 in | Wt 140.0 lb

## 2020-04-17 DIAGNOSIS — G43709 Chronic migraine without aura, not intractable, without status migrainosus: Secondary | ICD-10-CM | POA: Diagnosis not present

## 2020-04-17 DIAGNOSIS — Z1211 Encounter for screening for malignant neoplasm of colon: Secondary | ICD-10-CM | POA: Diagnosis not present

## 2020-04-17 NOTE — Assessment & Plan Note (Signed)
Ongoing, recent labs unremarkable with exception of mildly low normal B12.  Imaging showed no acute processes.  At this time HA had improved, will continue Propranolol for essential tremor, which also benefits migraines.  Recommend she maintain a migraine diary and avoid any triggers.  Plan for return in 6 months, sooner if worsening symptoms present.

## 2020-04-17 NOTE — Patient Instructions (Signed)
[facility]  Address: [address], [place], [place] [address]  Phone: [phone]  Migraine Headache A migraine headache is a very strong throbbing pain on one side or both sides of your head. This type of headache can also cause other symptoms. It can last from 4 hours to 3 days. Talk with your doctor about what things may bring on (trigger) this condition. What are the causes? The exact cause of this condition is not known. This condition may be triggered or caused by:  Drinking alcohol.  Smoking.  Taking medicines, such as: ? Medicine used to treat chest pain (nitroglycerin). ? Birth control pills. ? Estrogen. ? Some blood pressure medicines.  Eating or drinking certain products.  Doing physical activity. Other things that may trigger a migraine headache include:  Having a menstrual period.  Pregnancy.  Hunger.  Stress.  Not getting enough sleep or getting too much sleep.  Weather changes.  Tiredness (fatigue). What increases the risk?  Being 16-59 years old.  Being female.  Having a family history of migraine headaches.  Being Caucasian.  Having depression or anxiety.  Being very overweight. What are the signs or symptoms?  A throbbing pain. This pain may: ? Happen in any area of the head, such as on one side or both sides. ? Make it hard to do daily activities. ? Get worse with physical activity. ? Get worse around bright lights or loud noises.  Other symptoms may include: ? Feeling sick to your stomach (nauseous). ? Vomiting. ? Dizziness. ? Being sensitive to bright lights, loud noises, or smells.  Before you get a migraine headache, you may get warning signs (an aura). An aura may include: ? Seeing flashing lights or having blind spots. ? Seeing bright spots, halos, or zigzag lines. ? Having tunnel vision or blurred vision. ? Having numbness or a tingling feeling. ? Having trouble talking. ? Having  weak muscles.  Some people have symptoms after a migraine headache (postdromal phase), such as: ? Tiredness. ? Trouble thinking (concentrating). How is this treated?  Taking medicines that: ? Relieve pain. ? Relieve the feeling of being sick to your stomach. ? Prevent migraine headaches.  Treatment may also include: ? Having acupuncture. ? Avoiding foods that bring on migraine headaches. ? Learning ways to control your body functions (biofeedback). ? Therapy to help you know and deal with negative thoughts (cognitive behavioral therapy). Follow these instructions at home: Medicines  Take over-the-counter and prescription medicines only as told by your doctor.  Ask your doctor if the medicine prescribed to you: ? Requires you to avoid driving or using heavy machinery. ? Can cause trouble pooping (constipation). You may need to take these steps to prevent or treat trouble pooping:  Drink enough fluid to keep your pee (urine) pale yellow.  Take over-the-counter or prescription medicines.  Eat foods that are high in fiber. These include beans, whole grains, and fresh fruits and vegetables.  Limit foods that are high in fat and sugar. These include fried or sweet foods. Lifestyle  Do not drink alcohol.  Do not use any products that contain nicotine or tobacco, such as cigarettes, e-cigarettes, and chewing tobacco. If you need help quitting, ask your doctor.  Get at least 8 hours of sleep every night.  Limit and deal with stress. General instructions  Keep a journal to find out what may bring on your migraine headaches. For example, write down: ? What you eat and drink. ?  How much sleep you get. ? Any change in what you eat or drink. ? Any change in your medicines.  If you have a migraine headache: ? Avoid things that make your symptoms worse, such as bright lights. ? It may help to lie down in a dark, quiet room. ? Do not drive or use heavy machinery. ? Ask your  doctor what activities are safe for you.  Keep all follow-up visits as told by your doctor. This is important.      Contact a doctor if:  You get a migraine headache that is different or worse than others you have had.  You have more than 15 headache days in one month. Get help right away if:  Your migraine headache gets very bad.  Your migraine headache lasts longer than 72 hours.  You have a fever.  You have a stiff neck.  You have trouble seeing.  Your muscles feel weak or like you cannot control them.  You start to lose your balance a lot.  You start to have trouble walking.  You pass out (faint).  You have a seizure. Summary  A migraine headache is a very strong throbbing pain on one side or both sides of your head. These headaches can also cause other symptoms.  This condition may be treated with medicines and changes to your lifestyle.  Keep a journal to find out what may bring on your migraine headaches.  Contact a doctor if you get a migraine headache that is different or worse than others you have had.  Contact your doctor if you have more than 15 headache days in a month. This information is not intended to replace advice given to you by your health care provider. Make sure you discuss any questions you have with your health care provider. Document Revised: 06/12/2018 Document Reviewed: 04/02/2018 Elsevier Patient Education  [place].

## 2020-04-17 NOTE — Progress Notes (Signed)
BP 108/69   Pulse 62 Comment: apical  Temp 98.1 F (36.7 C) (Oral)   Ht 5' 1.1" (1.552 m)   Wt 140 lb (63.5 kg)   LMP  (LMP Unknown)   SpO2 98%   BMI 26.36 kg/m    Subjective:    Patient ID: Tabitha Larson, female    DOB: 1950-09-14, 70 y.o.   MRN: [id number]  HPI: Tabitha Larson is a 70 y.o. female  Chief Complaint  Patient presents with  . F/U Migraine    Patent states that it is some better   MIGRAINES At this time reports headaches are improving.  Is taking Propranolol for benign essential tremor at baseline.  Less frequent in nature.  Recent carotid imaging showed plaque bilaterally, but no significant stenosis and MRI brain no acute findings, did note paranasal sinus disease -- more to left. Duration: chronic Onset: gradual Severity: mild Quality: dull, aching and throbbing Frequency: intermittent Location:  Headache duration: Radiation: no Time of day headache occurs:  Alleviating factors:  Aggravating factors:  Headache status at time of visit: asymptomatic Treatments attempted: Treatments attempted: propranolol   Aura: no Nausea:  yes Vomiting: no Photophobia:  no Phonophobia:  no Effect on social functioning:  no Numbers of missed days of school/work each month: none Confusion:  no Gait disturbance/ataxia:  no Behavioral changes:  no Fevers:  no  Relevant past medical, surgical, family and social history reviewed and updated as indicated. Interim medical history since our last visit reviewed. Allergies and medications reviewed and updated.  Review of Systems  Constitutional: Negative for activity change, appetite change, diaphoresis, fatigue and fever.  Respiratory: Negative for cough, chest tightness and shortness of breath.   Cardiovascular: Negative for chest pain, palpitations and leg swelling.  Gastrointestinal: Negative.   Endocrine: Negative for cold intolerance, heat intolerance, polydipsia, polyphagia and polyuria.  Neurological:  Negative for dizziness, tremors, seizures, facial asymmetry, speech difficulty, weakness, light-headedness, numbness and headaches.  Psychiatric/Behavioral: Negative.     Per HPI unless specifically indicated above     Objective:    BP 108/69   Pulse 62 Comment: apical  Temp 98.1 F (36.7 C) (Oral)   Ht 5' 1.1" (1.552 m)   Wt 140 lb (63.5 kg)   LMP  (LMP Unknown)   SpO2 98%   BMI 26.36 kg/m   Wt Readings from Last 3 Encounters:  04/17/20 140 lb (63.5 kg)  03/21/20 140 lb 9.6 oz (63.8 kg)  10/07/19 136 lb 3.2 oz (61.8 kg)    Physical Exam Vitals and nursing note reviewed.  Constitutional:      General: She is awake. She is not in acute distress.    Appearance: She is well-developed and well-groomed. She is not ill-appearing.  HENT:     Head: Normocephalic.     Right Ear: Hearing normal.     Left Ear: Hearing normal.  Eyes:     General: Lids are normal.        Right eye: No discharge.        Left eye: No discharge.     Conjunctiva/sclera: Conjunctivae normal.     Pupils: Pupils are equal, round, and reactive to light.  Neck:     Thyroid: No thyromegaly.     Vascular: No carotid bruit.  Cardiovascular:     Rate and Rhythm: Normal rate and regular rhythm.     Heart sounds: Normal heart sounds. No murmur heard. No gallop.   Pulmonary:  Effort: Pulmonary effort is normal. No accessory muscle usage or respiratory distress.     Breath sounds: Normal breath sounds.  Abdominal:     General: Bowel sounds are normal.     Palpations: Abdomen is soft.  Musculoskeletal:     Cervical back: Normal range of motion and neck supple.     Right lower leg: No edema.     Left lower leg: No edema.  Lymphadenopathy:     Cervical: No cervical adenopathy.  Skin:    General: Skin is warm and dry.  Neurological:     Mental Status: She is alert and oriented to person, place, and time.     Cranial Nerves: Cranial nerves are intact.     Motor: Motor function is intact.      Coordination: Coordination is intact.     Gait: Gait is intact.     Deep Tendon Reflexes: Reflexes are normal and symmetric.     Reflex Scores:      Brachioradialis reflexes are 2+ on the right side and 2+ on the left side.      Patellar reflexes are 2+ on the right side and 2+ on the left side. Psychiatric:        Attention and Perception: Attention normal.        Mood and Affect: Mood normal.        Speech: Speech normal.        Behavior: Behavior normal. Behavior is cooperative.        Thought Content: Thought content normal.     Results for orders placed or performed in visit on 03/08/20  Comprehensive metabolic panel  Result Value Ref Range   Glucose 77 65 - 99 mg/dL   BUN 15 8 - 27 mg/dL   Creatinine, Ser 0.66 0.57 - 1.00 mg/dL   GFR calc non Af Amer 89 >59 mL/min/1.73   GFR calc Af Amer 103 >59 mL/min/1.73   BUN/Creatinine Ratio 23 12 - 28   Sodium 139 134 - 144 mmol/L   Potassium 4.0 3.5 - 5.2 mmol/L   Chloride 102 96 - 106 mmol/L   CO2 21 20 - 29 mmol/L   Calcium 9.6 8.7 - 10.3 mg/dL   Total Protein 7.4 6.0 - 8.5 g/dL   Albumin 4.5 3.7 - 4.7 g/dL   Globulin, Total 2.9 1.5 - 4.5 g/dL   Albumin/Globulin Ratio 1.6 1.2 - 2.2   Bilirubin Total 0.3 0.0 - 1.2 mg/dL   Alkaline Phosphatase 80 44 - 121 IU/L   AST 14 0 - 40 IU/L   ALT 17 0 - 32 IU/L  Lyme disease, western blot  Result Value Ref Range     IgG P93 Ab. Absent      IgG P66 Ab. Absent      IgG P58 Ab. Absent      IgG P45 Ab. Absent      IgG P41 Ab. Present (A)      IgG P39 Ab. Absent      IgG P30 Ab. Absent      IgG P28 Ab. Absent      IgG P23 Ab. Absent      IgG P18 Ab. Absent    Lyme IgG Wb Negative      IgM P41 Ab. Absent      IgM P39 Ab. Absent      IgM P23 Ab. Absent    Lyme IgM Wb Negative   B. burgdorfi antibodies  Result Value Ref Range  Lyme IgG/IgM Ab <0.91 0.00 - 0.90 ISR  CBC with Differential/Platelet  Result Value Ref Range   WBC 6.7 3.4 - 10.8 x10E3/uL   RBC 5.05 3.77 - 5.28  x10E6/uL   Hemoglobin 14.2 11.1 - 15.9 g/dL   Hematocrit 43.3 34.0 - 46.6 %   MCV 86 79 - 97 fL   MCH 28.1 26.6 - 33.0 pg   MCHC 32.8 31.5 - 35.7 g/dL   RDW 14.5 11.7 - 15.4 %   Platelets 214 150 - 450 x10E3/uL   Neutrophils 62 Not Estab. %   Lymphs 29 Not Estab. %   Monocytes 7 Not Estab. %   Eos 1 Not Estab. %   Basos 1 Not Estab. %   Neutrophils Absolute 4.2 1.4 - 7.0 x10E3/uL   Lymphocytes Absolute 2.0 0.7 - 3.1 x10E3/uL   Monocytes Absolute 0.5 0.1 - 0.9 x10E3/uL   EOS (ABSOLUTE) 0.1 0.0 - 0.4 x10E3/uL   Basophils Absolute 0.0 0.0 - 0.2 x10E3/uL   Immature Granulocytes 0 Not Estab. %   Immature Grans (Abs) 0.0 0.0 - 0.1 x10E3/uL  Vitamin B12  Result Value Ref Range   Vitamin B-12 372 232 - 1,245 pg/mL  TSH  Result Value Ref Range   TSH 2.840 0.450 - 4.500 uIU/mL  Lipid Panel w/o Chol/HDL Ratio  Result Value Ref Range   Cholesterol, Total 172 100 - 199 mg/dL   Triglycerides 159 (H) 0 - 149 mg/dL   HDL 43 >39 mg/dL   VLDL Cholesterol Cal 28 5 - 40 mg/dL   LDL Chol Calc (NIH) 101 (H) 0 - 99 mg/dL  Microalbumin, Urine Waived  Result Value Ref Range   Microalb, Ur Waived 10 0 - 19 mg/L   Creatinine, Urine Waived 50 10 - 300 mg/dL   Microalb/Creat Ratio <30 <30 mg/g  Bayer DCA Hb A1c Waived  Result Value Ref Range   HB A1C (BAYER DCA - WAIVED) 5.7 <7.0 %      Assessment & Plan:   Problem List Items Addressed This Visit      Cardiovascular and Mediastinum   Headache, migraine - Primary    Ongoing, recent labs unremarkable with exception of mildly low normal B12.  Imaging showed no acute processes.  At this time HA had improved, will continue Propranolol for essential tremor, which also benefits migraines.  Recommend she maintain a migraine diary and avoid any triggers.  Plan for return in 6 months, sooner if worsening symptoms present.         Other Visit Diagnoses    Colon cancer screening       GI referral in place   Relevant Orders   Ambulatory referral to  Gastroenterology       Follow up plan: Return in about 6 months (around 10/15/2020) for Depression, HTN/HLD, .

## 2020-05-01 ENCOUNTER — Other Ambulatory Visit: Payer: Self-pay

## 2020-05-01 ENCOUNTER — Telehealth (INDEPENDENT_AMBULATORY_CARE_PROVIDER_SITE_OTHER): Payer: Self-pay | Admitting: Gastroenterology

## 2020-05-01 DIAGNOSIS — Z86010 Personal history of colonic polyps: Secondary | ICD-10-CM

## 2020-05-01 MED ORDER — PEG 3350-KCL-NA BICARB-NACL 420 G PO SOLR: 4000.0000 mL | Freq: Once | ORAL | 0 refills | Status: AC

## 2020-05-01 NOTE — Progress Notes (Signed)
Gastroenterology Pre-Procedure Review  Request Date: Tuesday 05/16/20 Requesting Physician: Dr. Allen Norris  PATIENT REVIEW QUESTIONS: The patient responded to the following health history questions as indicated:    1. Are you having any GI issues? no 2. Do you have a personal history of Polyps? yes (07/28/15 colonoscopy performed by Dr. Allen Norris noted colon polyps) 3. Do you have a family history of Colon Cancer or Polyps? yes (sister had colon cancer) 4. Diabetes Mellitus? no 5. Joint replacements in the past 12 months?no 6. Major health problems in the past 3 months?no 7. Any artificial heart valves, MVP, or defibrillator?no    MEDICATIONS & ALLERGIES:    Patient reports the following regarding taking any anticoagulation/antiplatelet therapy:   Plavix, Coumadin, Eliquis, Xarelto, Lovenox, Pradaxa, Brilinta, or Effient? no Aspirin? no  Patient confirms/reports the following medications:  Current Outpatient Medications  Medication Sig Dispense Refill  . amLODipine (NORVASC) 5 MG tablet Take 1 tablet by mouth once daily 90 tablet 3  . atorvastatin (LIPITOR) 40 MG tablet Take 1 tablet by mouth once daily 90 tablet 3  . hydrochlorothiazide (HYDRODIURIL) 25 MG tablet Take 1 tablet by mouth once daily 90 tablet 3  . Magnesium 100 MG CAPS Take 1 capsule by mouth daily.     . pantoprazole (PROTONIX) 20 MG tablet Take 1 tablet (20 mg total) by mouth daily. 90 tablet 4  . propranolol (INDERAL) 10 MG tablet Take 1 tablet (10 mg total) by mouth daily. 30 tablet 4  . sertraline (ZOLOFT) 100 MG tablet Take 1 tablet by mouth once daily 90 tablet 0  . vitamin B-12 (CYANOCOBALAMIN) 500 MCG tablet Take 500 mcg by mouth daily.     No current facility-administered medications for this visit.    Patient confirms/reports the following allergies:  No Known Allergies  No orders of the defined types were placed in this encounter.   AUTHORIZATION INFORMATION Primary Insurance: 1D#: Group #:  Secondary  Insurance: 1D#: Group #:  SCHEDULE INFORMATION: Date: 05/16/20 Time: Location:[facility]

## 2020-05-10 ENCOUNTER — Other Ambulatory Visit: Payer: Self-pay | Admitting: Nurse Practitioner

## 2020-05-12 ENCOUNTER — Other Ambulatory Visit: Payer: Self-pay

## 2020-05-12 ENCOUNTER — Other Ambulatory Visit
Admission: RE | Admit: 2020-05-12 | Discharge: 2020-05-12 | Disposition: A | Discharge status: Home / Self Care | Payer: Medicare PPO | Source: Ambulatory Visit | Attending: Gastroenterology | Admitting: Gastroenterology

## 2020-05-12 DIAGNOSIS — Z01812 Encounter for preprocedural laboratory examination: Secondary | ICD-10-CM | POA: Insufficient documentation

## 2020-05-12 DIAGNOSIS — Z20822 Contact with and (suspected) exposure to covid-19: Secondary | ICD-10-CM | POA: Diagnosis not present

## 2020-05-12 LAB — SARS CORONAVIRUS 2 (TAT 6-24 HRS): SARS Coronavirus 2: NEGATIVE

## 2020-05-16 ENCOUNTER — Ambulatory Visit: Payer: Medicare PPO | Admitting: Anesthesiology

## 2020-05-16 ENCOUNTER — Other Ambulatory Visit: Payer: Self-pay

## 2020-05-16 ENCOUNTER — Encounter: Payer: Self-pay | Admitting: Gastroenterology

## 2020-05-16 ENCOUNTER — Ambulatory Visit
Admission: RE | Admit: 2020-05-16 | Discharge: 2020-05-16 | Disposition: A | Discharge status: Home / Self Care | Payer: Medicare PPO | Attending: Gastroenterology | Admitting: Gastroenterology

## 2020-05-16 ENCOUNTER — Encounter
Admission: RE | Disposition: A | Discharge status: Home / Self Care | Payer: Self-pay | Source: Home / Self Care | Attending: Gastroenterology

## 2020-05-16 DIAGNOSIS — Z860100 Personal history of colon polyps, unspecified: Secondary | ICD-10-CM

## 2020-05-16 DIAGNOSIS — K635 Polyp of colon: Secondary | ICD-10-CM

## 2020-05-16 DIAGNOSIS — Z87891 Personal history of nicotine dependence: Secondary | ICD-10-CM | POA: Insufficient documentation

## 2020-05-16 DIAGNOSIS — Z79899 Other long term (current) drug therapy: Secondary | ICD-10-CM | POA: Insufficient documentation

## 2020-05-16 DIAGNOSIS — D125 Benign neoplasm of sigmoid colon: Secondary | ICD-10-CM | POA: Insufficient documentation

## 2020-05-16 DIAGNOSIS — Z1211 Encounter for screening for malignant neoplasm of colon: Secondary | ICD-10-CM | POA: Diagnosis not present

## 2020-05-16 HISTORY — PX: COLONOSCOPY WITH PROPOFOL: SHX5780

## 2020-05-16 SURGERY — COLONOSCOPY WITH PROPOFOL
Anesthesia: General

## 2020-05-16 MED ORDER — PROPOFOL 10 MG/ML IV BOLUS
INTRAVENOUS | Status: AC
  Filled 2020-05-16: qty 20

## 2020-05-16 MED ORDER — PROPOFOL 500 MG/50ML IV EMUL
INTRAVENOUS | Status: DC | PRN
  Administered 2020-05-16: 140 ug/kg/min via INTRAVENOUS

## 2020-05-16 MED ORDER — LIDOCAINE HCL (PF) 2 % IJ SOLN
INTRAMUSCULAR | Status: AC
  Filled 2020-05-16: qty 5

## 2020-05-16 MED ORDER — PHENYLEPHRINE HCL (PRESSORS) 10 MG/ML IV SOLN
INTRAVENOUS | Status: DC | PRN
  Administered 2020-05-16: 100 ug via INTRAVENOUS
  Administered 2020-05-16: 50 ug via INTRAVENOUS

## 2020-05-16 MED ORDER — PROPOFOL 10 MG/ML IV BOLUS
INTRAVENOUS | Status: DC | PRN
  Administered 2020-05-16: 40 mg via INTRAVENOUS
  Administered 2020-05-16 (×2): 20 mg via INTRAVENOUS

## 2020-05-16 MED ORDER — GLYCOPYRROLATE 0.2 MG/ML IJ SOLN
INTRAMUSCULAR | Status: DC | PRN
  Administered 2020-05-16: .2 mg via INTRAVENOUS

## 2020-05-16 MED ORDER — SODIUM CHLORIDE 0.9 % IV SOLN: INTRAVENOUS | Status: DC

## 2020-05-16 MED ORDER — LIDOCAINE HCL (CARDIAC) PF 100 MG/5ML IV SOSY
PREFILLED_SYRINGE | INTRAVENOUS | Status: DC | PRN
  Administered 2020-05-16: 100 mg via INTRAVENOUS

## 2020-05-16 NOTE — Anesthesia Postprocedure Evaluation (Signed)
Anesthesia Post Note  Patient: Tabitha Larson  Procedure(s) Performed: COLONOSCOPY WITH PROPOFOL (N/A )  Patient location during evaluation: Endoscopy Anesthesia Type: General Level of consciousness: awake and alert Pain management: pain level controlled Vital Signs Assessment: post-procedure vital signs reviewed and stable Respiratory status: spontaneous breathing, nonlabored ventilation, respiratory function stable and patient connected to nasal cannula oxygen Cardiovascular status: blood pressure returned to baseline and stable Postop Assessment: no apparent nausea or vomiting Anesthetic complications: no   No complications documented.   Last Vitals:  Vitals:   05/16/20 1145 05/16/20 1155  BP: 126/76 (!) 143/82  Pulse: 65 61  Resp: 18 16  Temp:    SpO2: 99% 99%    Last Pain:  Vitals:   05/16/20 1155  TempSrc:   PainSc: 0-No pain                 Precious Haws Baran Kuhrt

## 2020-05-16 NOTE — Transfer of Care (Signed)
Immediate Anesthesia Transfer of Care Note  Patient: Tabitha Larson  Procedure(s) Performed: COLONOSCOPY WITH PROPOFOL (N/A )  Patient Location: Endoscopy Unit  Anesthesia Type:General  Level of Consciousness: drowsy  Airway & Oxygen Therapy: Patient Spontanous Breathing  Post-op Assessment: Report given to RN and Post -op Vital signs reviewed and stable  Post vital signs: Reviewed and stable  Last Vitals:  Vitals Value Taken Time  BP 147/77   Temp 36.4 C 05/16/20 1125  Pulse 59 05/16/20 1125  Resp 10 05/16/20 1125  SpO2 96 % 05/16/20 1125  Vitals shown include unvalidated device data.  Last Pain:  Vitals:   05/16/20 1125  TempSrc: Temporal  PainSc:          Complications: No complications documented.

## 2020-05-16 NOTE — Anesthesia Preprocedure Evaluation (Signed)
Anesthesia Evaluation  Patient identified by MRN, date of birth, ID band Patient awake    Reviewed: Allergy & Precautions, H&P , NPO status , Patient's Chart, lab work & pertinent test results  History of Anesthesia Complications (+) PONV and history of anesthetic complications  Airway Mallampati: III  TM Distance: >3 FB Neck ROM: full    Dental  (+) Chipped   Pulmonary neg shortness of breath, former smoker,    Pulmonary exam normal        Cardiovascular Exercise Tolerance: Good hypertension, (-) angina(-) Past MI negative cardio ROS Normal cardiovascular exam     Neuro/Psych  Headaches, PSYCHIATRIC DISORDERS    GI/Hepatic Neg liver ROS, GERD  Medicated and Controlled,  Endo/Other  negative endocrine ROS  Renal/GU negative Renal ROS  negative genitourinary   Musculoskeletal   Abdominal   Peds  Hematology negative hematology ROS (+)   Anesthesia Other Findings Past Medical History: No date: Ankle fracture No date: Anxiety No date: Depression No date: GERD (gastroesophageal reflux disease) No date: Hyperlipidemia No date: Hypertension No date: Migraine No date: Osteoporosis No date: PONV (postoperative nausea and vomiting)     Comment:  after ankle repair  No date: Wears dentures     Comment:  full upper and lower  Past Surgical History: No date: ANKLE FRACTURE SURGERY 01/2014: CATARACT EXTRACTION; Right 03/26/2016: CATARACT EXTRACTION W/PHACO; Left     Comment:  Procedure: CATARACT EXTRACTION PHACO AND INTRAOCULAR               LENS PLACEMENT ([place])  left;  Surgeon: Eulogio Bear,               MD;  Location: [facility];  Service:               Ophthalmology;  Laterality: Left; 02/04/2014: COLONOSCOPY 07/28/2015: COLONOSCOPY WITH PROPOFOL; N/A     Comment:  Procedure: COLONOSCOPY WITH PROPOFOL;  Surgeon: Lucilla Lame, MD;  Location: [facility];  Service:                Endoscopy;  Laterality: N/A;  No anesthesia 12/17/2018: ESOPHAGOGASTRODUODENOSCOPY (EGD) WITH PROPOFOL; N/A     Comment:  Procedure: ESOPHAGOGASTRODUODENOSCOPY (EGD) WITH               Dilation;  Surgeon: Lucilla Lame, MD;  Location: [facility];  Service: Endoscopy;  Laterality: N/A;                requests mid-morning arrival No date: MANDIBLE SURGERY 07/28/2015: POLYPECTOMY     Comment:  Procedure: POLYPECTOMY;  Surgeon: Lucilla Lame, MD;                Location: [facility];  Service: Endoscopy;;  BMI    Body Mass Index: 25.61 kg/m      Reproductive/Obstetrics negative OB ROS                             Anesthesia Physical Anesthesia Plan  ASA: III  Anesthesia Plan: General   Post-op Pain Management:    Induction: Intravenous  PONV Risk Score and Plan: Propofol infusion and TIVA  Airway Management Planned: Natural Airway and Nasal Cannula  Additional Equipment:   Intra-op Plan:   Post-operative Plan:   Informed Consent:  I have reviewed the patients History and Physical, chart, labs and discussed the procedure including the risks, benefits and alternatives for the proposed anesthesia with the patient or authorized representative who has indicated his/her understanding and acceptance.     Dental Advisory Given  Plan Discussed with: Anesthesiologist, CRNA and Surgeon  Anesthesia Plan Comments: (Patient consented for risks of anesthesia including but not limited to:  - adverse reactions to medications - risk of airway placement if required - damage to eyes, teeth, lips or other oral mucosa - nerve damage due to positioning  - sore throat or hoarseness - Damage to heart, brain, nerves, lungs, other parts of body or loss of life  Patient voiced understanding.)        Anesthesia Quick Evaluation

## 2020-05-16 NOTE — Op Note (Signed)
[facility] Gastroenterology Patient Name: Tabitha Larson Procedure Date: 05/16/2020 11:04 AM MRN: [id number] Account #: [id number] Date of Birth: 11/13/1950 Admit Type: Outpatient Age: 70 Room: [facility] ENDO ROOM 4 Gender: Female Note Status: Finalized Procedure:             Colonoscopy Indications:           High risk colon cancer surveillance: Personal history                         of colonic polyps Providers:             Lucilla Lame MD, MD Referring MD:          Franchot Gallo (Referring MD) Medicines:             Propofol per Anesthesia Complications:         No immediate complications. Procedure:             Pre-Anesthesia Assessment:                        - Prior to the procedure, a History and Physical was                         performed, and patient medications and allergies were                         reviewed. The patient's tolerance of previous                         anesthesia was also reviewed. The risks and benefits                         of the procedure and the sedation options and risks                         were discussed with the patient. All questions were                         answered, and informed consent was obtained. Prior                         Anticoagulants: The patient has taken no previous                         anticoagulant or antiplatelet agents. ASA Grade                         Assessment: II - A patient with mild systemic disease.                         After reviewing the risks and benefits, the patient                         was deemed in satisfactory condition to undergo the                         procedure.  After obtaining informed consent, the colonoscope was                         passed under direct vision. Throughout the procedure,                         the patient's blood pressure, pulse, and oxygen                         saturations were monitored continuously. The                          Colonoscope was introduced through the anus and                         advanced to the the cecum, identified by appendiceal                         orifice and ileocecal valve. The colonoscopy was                         performed without difficulty. The patient tolerated                         the procedure well. The quality of the bowel                         preparation was good. Findings:      The perianal and digital rectal examinations were normal.      A 4 mm polyp was found in the sigmoid colon. The polyp was sessile. The       polyp was removed with a cold biopsy forceps. Resection and retrieval       were complete. Impression:            - One 4 mm polyp in the sigmoid colon, removed with a                         cold biopsy forceps. Resected and retrieved. Recommendation:        - Discharge patient to home.                        - Resume previous diet.                        - Continue present medications.                        - Await pathology results.                        - Repeat colonoscopy in 5 years for surveillance. Procedure Code(s):     --- Professional ---                        [id number], Colonoscopy, flexible; with biopsy, single or                         multiple Diagnosis Code(s):     --- Professional ---  Z86.010, Personal history of colonic polyps                        K63.5, Polyp of colon CPT copyright 2019 American Medical Association. All rights reserved. The codes documented in this report are preliminary and upon coder review may  be revised to meet current compliance requirements. Lucilla Lame MD, MD 05/16/2020 11:24:07 AM This report has been signed electronically. Number of Addenda: 0 Note Initiated On: 05/16/2020 11:04 AM Scope Withdrawal Time: 0 hours 8 minutes 30 seconds  Total Procedure Duration: 0 hours 13 minutes 35 seconds  Estimated Blood Loss:  Estimated blood loss: none.      [facility]

## 2020-05-16 NOTE — Anesthesia Procedure Notes (Signed)
Procedure Name: MAC Date/Time: 05/16/2020 11:12 AM Performed by: Lily Peer, Jamari Diana, CRNA Pre-anesthesia Checklist: Patient identified, Emergency Drugs available, Suction available, Patient being monitored and Timeout performed Oxygen Delivery Method: Nasal cannula Induction Type: IV induction

## 2020-05-16 NOTE — H&P (Signed)
Lucilla Lame, MD [facility] [address], [place], [place] [address] Phone:[phone] Fax : [phone]  Primary Care Physician:  Venita Lick, NP Primary Gastroenterologist:  Dr. Allen Norris  Pre-Procedure History & Physical: HPI:  Tabitha Larson is a 70 y.o. female is here for an colonoscopy.   Past Medical History:  Diagnosis Date  . Ankle fracture   . Anxiety   . Depression   . GERD (gastroesophageal reflux disease)   . Hyperlipidemia   . Hypertension   . Migraine   . Osteoporosis   . PONV (postoperative nausea and vomiting)    after ankle repair   . Wears dentures    full upper and lower    Past Surgical History:  Procedure Laterality Date  . ANKLE FRACTURE SURGERY    . CATARACT EXTRACTION Right 01/2014  . CATARACT EXTRACTION W/PHACO Left 03/26/2016   Procedure: CATARACT EXTRACTION PHACO AND INTRAOCULAR LENS PLACEMENT ([place])  left;  Surgeon: Eulogio Bear, MD;  Location: [place];  Service: Ophthalmology;  Laterality: Left;  . COLONOSCOPY  02/04/2014  . COLONOSCOPY WITH PROPOFOL N/A 07/28/2015   Procedure: COLONOSCOPY WITH PROPOFOL;  Surgeon: Lucilla Lame, MD;  Location: [place];  Service: Endoscopy;  Laterality: N/A;  No anesthesia  . ESOPHAGOGASTRODUODENOSCOPY (EGD) WITH PROPOFOL N/A 12/17/2018   Procedure: ESOPHAGOGASTRODUODENOSCOPY (EGD) WITH Dilation;  Surgeon: Lucilla Lame, MD;  Location: [place];  Service: Endoscopy;  Laterality: N/A;  requests mid-morning arrival  . MANDIBLE SURGERY    . POLYPECTOMY  07/28/2015   Procedure: POLYPECTOMY;  Surgeon: Lucilla Lame, MD;  Location: [place];  Service: Endoscopy;;    Prior to Admission medications   Medication Sig Start Date End Date Taking? Authorizing Provider  amLODipine (NORVASC) 5 MG tablet Take 1 tablet by mouth once daily 05/10/20  Yes Cannady, Jolene T, NP  atorvastatin (LIPITOR) 40 MG tablet Take 1 tablet by mouth once daily 05/10/20  Yes Cannady, Jolene T,  NP  hydrochlorothiazide (HYDRODIURIL) 25 MG tablet Take 1 tablet by mouth once daily 03/01/19  Yes Cannady, Jolene T, NP  pantoprazole (PROTONIX) 20 MG tablet Take 1 tablet (20 mg total) by mouth daily. 06/14/19  Yes Cannady, Jolene T, NP  propranolol (INDERAL) 10 MG tablet Take 1 tablet (10 mg total) by mouth daily. 03/21/20  Yes Cannady, Henrine Screws T, NP  sertraline (ZOLOFT) 100 MG tablet Take 1 tablet by mouth once daily 02/29/20  Yes Cannady, Jolene T, NP  Magnesium 100 MG CAPS Take 1 capsule by mouth daily.  Patient not taking: Reported on 05/16/2020    [provider]  vitamin B-12 (CYANOCOBALAMIN) 500 MCG tablet Take 500 mcg by mouth daily. Patient not taking: Reported on 05/16/2020    [provider]    Allergies as of 05/01/2020  . (No Known Allergies)    Family History  Problem Relation Age of Onset  . Stroke Mother   . Prostate cancer Father   . Colon cancer Sister   . Hypertension Sister   . Hyperlipidemia Sister   . Breast cancer Neg Hx     Social History   Socioeconomic History  . Marital status: Divorced    Spouse name: Not on file  . Number of children: Not on file  . Years of education: Not on file  . Highest education level: High school graduate  Occupational History  . Occupation: retired  Tobacco Use  . Smoking status: Former Smoker    Types: Cigarettes    Quit  date: 01/05/1984    Years since quitting: 36.3  . Smokeless tobacco: Never Used  Vaping Use  . Vaping Use: Never used  Substance and Sexual Activity  . Alcohol use: No    Alcohol/week: 0.0 standard drinks  . Drug use: No  . Sexual activity: Not Currently    Birth control/protection: Post-menopausal  Other Topics Concern  . Not on file  Social History Narrative   Niece lives in area   No kids   Has friends close by   [employer] live with her currently    Social Determinants of Health   Financial Resource Strain: Not on file  Food Insecurity: Not on file  Transportation  Needs: Not on file  Physical Activity: Not on file  Stress: Not on file  Social Connections: Socially Isolated  . Frequency of Communication with Friends and Family: More than three times a week  . Frequency of Social Gatherings with Friends and Family: More than three times a week  . Attends Religious Services: Never  . Active Member of Clubs or Organizations: No  . Attends Archivist Meetings: Never  . Marital Status: Widowed  Human resources officer Violence: Not on file    Review of Systems: See HPI, otherwise negative ROS  Physical Exam: BP 126/75   Pulse (!) 58   Temp (!) 97 F (36.1 C) (Temporal)   Resp 18   Ht 5\' 2"  (1.575 m)   Wt 63.5 kg   LMP  (LMP Unknown)   SpO2 99%   BMI 25.61 kg/m  General:   Alert,  pleasant and cooperative in NAD Head:  Normocephalic and atraumatic. Neck:  Supple; no masses or thyromegaly. Lungs:  Clear throughout to auscultation.    Heart:  Regular rate and rhythm. Abdomen:  Soft, nontender and nondistended. Normal bowel sounds, without guarding, and without rebound.   Neurologic:  Alert and  oriented x4;  grossly normal neurologically.  Impression/Plan: Tabitha Larson is here for an colonoscopy to be performed for history of colon polyps  Risks, benefits, limitations, and alternatives regarding  colonoscopy have been reviewed with the patient.  Questions have been answered.  All parties agreeable.   Lucilla Lame, MD  05/16/2020, 10:55 AM

## 2020-05-17 ENCOUNTER — Encounter: Payer: Self-pay | Admitting: Gastroenterology

## 2020-05-17 LAB — SURGICAL PATHOLOGY

## 2020-05-18 ENCOUNTER — Encounter: Payer: Self-pay | Admitting: Gastroenterology

## 2020-06-01 ENCOUNTER — Telehealth: Payer: Self-pay | Admitting: Nurse Practitioner

## 2020-06-01 NOTE — Telephone Encounter (Signed)
Copied from [place] [phone]. Topic: Medicare AWV >> Jun 01, 2020  1:46 PM Weston Anna wrote: Reason for CRM:   LM re: AWVS on April 13th has been rescheduled to do by phone on June 16, 2020 at 9:00am.

## 2020-06-02 ENCOUNTER — Other Ambulatory Visit: Payer: Self-pay | Admitting: Nurse Practitioner

## 2020-06-06 ENCOUNTER — Telehealth: Payer: Self-pay | Admitting: Nurse Practitioner

## 2020-06-06 NOTE — Telephone Encounter (Signed)
Copied from [place] [id number]. Topic: Medicare AWV >> Jun 06, 2020 11:14 AM Cher Nakai R wrote: Reason for CRM:  06/06/20 LM re: AWVS on April 15th has been rescheduled to do by phone on June 23, 2020 at 9:00am due to office being closed for Good Friday

## 2020-06-14 ENCOUNTER — Ambulatory Visit: Payer: Medicare PPO

## 2020-06-16 ENCOUNTER — Ambulatory Visit: Payer: Medicare PPO

## 2020-06-23 ENCOUNTER — Ambulatory Visit (INDEPENDENT_AMBULATORY_CARE_PROVIDER_SITE_OTHER): Payer: Medicare PPO

## 2020-06-23 VITALS — Ht 62.0 in | Wt 140.0 lb

## 2020-06-23 DIAGNOSIS — Z0000 Encounter for general adult medical examination without abnormal findings: Secondary | ICD-10-CM

## 2020-06-23 NOTE — Progress Notes (Signed)
I connected with Tabitha Larson today by telephone and verified that I am speaking with the correct person using two identifiers. Location patient: home Location provider: work Persons participating in the virtual visit: Tabitha Larson, [place] LPN.   I discussed the limitations, risks, security and privacy concerns of performing an evaluation and management service by telephone and the availability of in person appointments. I also discussed with the patient that there may be a patient responsible charge related to this service. The patient expressed understanding and verbally consented to this telephonic visit.    Interactive audio and video telecommunications were attempted between this provider and patient, however failed, due to patient having technical difficulties OR patient did not have access to video capability.  We continued and completed visit with audio only.     Vital signs may be patient reported or missing.  Subjective:   Tabitha Larson is a 70 y.o. female who presents for Medicare Annual (Subsequent) preventive examination.  Review of Systems     Cardiac Risk Factors include: advanced age (>1men, >58 women);hypertension;sedentary lifestyle     Objective:    Today's Vitals   06/23/20 0856  Weight: 140 lb (63.5 kg)  Height: 5\' 2"  (1.575 m)   Body mass index is 25.61 kg/m.  Advanced Directives 06/23/2020 05/16/2020 06/14/2019 01/11/2019 12/17/2018 05/18/2018 05/08/2017  Does Patient Have a Medical Advance Directive? Yes Yes Yes No No - No  Type of Paramedic of [place];Living will [place];Living will - - - - -  Does patient want to make changes to medical advance directive? - - Yes (MAU/Ambulatory/Procedural Areas - Information given) - - - -  Copy of [place] in Chart? No - copy requested No - copy requested - - - - -  Would patient like information on creating a medical advance directive? - - -  No - Patient declined Yes (MAU/Ambulatory/Procedural Areas - Information given) No - Patient declined Yes (MAU/Ambulatory/Procedural Areas - Information given)    Current Medications (verified) Outpatient Encounter Medications as of 06/23/2020  Medication Sig  . amLODipine (NORVASC) 5 MG tablet Take 1 tablet by mouth once daily  . atorvastatin (LIPITOR) 40 MG tablet Take 1 tablet by mouth once daily  . hydrochlorothiazide (HYDRODIURIL) 25 MG tablet Take 1 tablet by mouth once daily  . pantoprazole (PROTONIX) 20 MG tablet Take 1 tablet (20 mg total) by mouth daily.  . sertraline (ZOLOFT) 100 MG tablet Take 1 tablet by mouth once daily  . Magnesium 100 MG CAPS Take 1 capsule by mouth daily.  (Patient not taking: No sig reported)  . propranolol (INDERAL) 10 MG tablet Take 1 tablet (10 mg total) by mouth daily. (Patient not taking: Reported on 06/23/2020)  . vitamin B-12 (CYANOCOBALAMIN) 500 MCG tablet Take 500 mcg by mouth daily. (Patient not taking: No sig reported)   No facility-administered encounter medications on file as of 06/23/2020.    Allergies (verified) Patient has no known allergies.   History: Past Medical History:  Diagnosis Date  . Ankle fracture   . Anxiety   . Depression   . GERD (gastroesophageal reflux disease)   . Hyperlipidemia   . Hypertension   . Migraine   . Osteoporosis   . PONV (postoperative nausea and vomiting)    after ankle repair   . Wears dentures    full upper and lower   Past Surgical History:  Procedure Laterality Date  . ANKLE FRACTURE SURGERY    .  CATARACT EXTRACTION Right 01/2014  . CATARACT EXTRACTION W/PHACO Left 03/26/2016   Procedure: CATARACT EXTRACTION PHACO AND INTRAOCULAR LENS PLACEMENT ([facility])  left;  Surgeon: Nevada Crane, MD;  Location[facility];  Service: Ophthalmology;  Laterality: Left;  . COLONOSCOPY  02/04/2014  . COLONOSCOPY WITH PROPOFOL N/A 07/28/2015   Procedure: COLONOSCOPY WITH PROPOFOL;  Surgeon: Midge Minium, MD;  Location: [facility];  Service: Endoscopy;  Laterality: N/A;  No anesthesia  . COLONOSCOPY WITH PROPOFOL N/A 05/16/2020   Procedure: COLONOSCOPY WITH PROPOFOL;  Surgeon: Midge Minium, MD;  Location: [facility] ENDOSCOPY;  Service: Endoscopy;  Laterality: N/A;  . ESOPHAGOGASTRODUODENOSCOPY (EGD) WITH PROPOFOL N/A 12/17/2018   Procedure: ESOPHAGOGASTRODUODENOSCOPY (EGD) WITH Dilation;  Surgeon: Midge Minium, MD;  Location: [facility];  Service: Endoscopy;  Laterality: N/A;  requests mid-morning arrival  . MANDIBLE SURGERY    . POLYPECTOMY  07/28/2015   Procedure: POLYPECTOMY;  Surgeon: Midge Minium, MD;  Location: [facility];  Service: Endoscopy;;   Family History  Problem Relation Age of Onset  . Stroke Mother   . Prostate cancer Father   . Colon cancer Sister   . Hypertension Sister   . Hyperlipidemia Sister   . Breast cancer Neg Hx    Social History   Socioeconomic History  . Marital status: Divorced    Spouse name: Not on file  . Number of children: Not on file  . Years of education: Not on file  . Highest education level: High school graduate  Occupational History  . Occupation: retired  Tobacco Use  . Smoking status: Former Smoker    Types: Cigarettes    Quit date: 01/05/1984    Years since quitting: 36.4  . Smokeless tobacco: Never Used  Vaping Use  . Vaping Use: Never used  Substance and Sexual Activity  . Alcohol use: No    Alcohol/week: 0.0 standard drinks  . Drug use: No  . Sexual activity: Not Currently    Birth control/protection: Post-menopausal  Other Topics Concern  . Not on file  Social History Narrative   Niece lives in area   No kids   Has friends close by   [place] live with her currently    Social Determinants of Health   Financial Resource Strain: Low Risk   . Difficulty of Paying Living Expenses: Not hard at all  Food Insecurity: No Food Insecurity  . Worried About Programme researcher, broadcasting/film/video in the Last Year: Never true   . Ran Out of Food in the Last Year: Never true  Transportation Needs: No Transportation Needs  . Lack of Transportation (Medical): No  . Lack of Transportation (Non-Medical): No  Physical Activity: Inactive  . Days of Exercise per Week: 0 days  . Minutes of Exercise per Session: 0 min  Stress: No Stress Concern Present  . Feeling of Stress : Only a little  Social Connections: Not on file    Tobacco Counseling Counseling given: Not Answered   Clinical Intake:  Pre-visit preparation completed: Yes  Pain : No/denies pain     Nutritional Status: BMI 25 -29 Overweight Nutritional Risks: Nausea/ vomitting/ diarrhea (nausea) Diabetes: No  How often do you need to have someone help you when you read instructions, pamphlets, or other written materials from your doctor or pharmacy?: 1 - Never What is the last grade level you completed in school?: 12th grade  Diabetic? no  Interpreter Needed?: No  Information entered by :: NAllen LPN   Activities of Daily  Living In your present state of health, do you have any difficulty performing the following activities: 06/23/2020  Hearing? N  Vision? N  Difficulty concentrating or making decisions? N  Walking or climbing stairs? N  Dressing or bathing? N  Doing errands, shopping? N  Preparing Food and eating ? N  Using the Toilet? N  In the past six months, have you accidently leaked urine? Y  Do you have problems with loss of bowel control? N  Managing your Medications? N  Managing your Finances? N  Housekeeping or managing your Housekeeping? N  Some recent data might be hidden    Patient Care Team: Marjie Skiff, NP as PCP - General (Nurse Practitioner) Nevada Crane, MD as Consulting Physician (Ophthalmology)  Indicate any recent Medical Services you may have received from other than [facility] providers in the past year (date may be approximate).     Assessment:   This is a routine wellness examination for  [place].  Hearing/Vision screen  Hearing Screening   125Hz  250Hz  500Hz  1000Hz  2000Hz  3000Hz  4000Hz  6000Hz  8000Hz   Right ear:           Left ear:           Vision Screening Comments: No regular eye exams, [facility]  Dietary issues and exercise activities discussed: Current Exercise Habits: The patient does not participate in regular exercise at present  Goals    . DIET - INCREASE WATER INTAKE     Recommend drinking at least 6-8 glasses of water     . Patient Stated     06/23/2020, decrease sugar intake, eat healthier      Depression Screen PHQ 2/9 Scores 06/23/2020 04/17/2020 03/21/2020 03/08/2020 10/07/2019 09/07/2019 07/26/2019  PHQ - 2 Score 3 0 0 1 2 2 1   PHQ- 9 Score 7 0 - 8 11 6 7     Fall Risk Fall Risk  06/23/2020 04/17/2020 03/21/2020 03/08/2020 06/14/2019  Falls in the past year? 0 0 0 0 0  Number falls in past yr: - - - - -  Injury with Fall? - - - - -  Risk for fall due to : Medication side effect - - - -  Follow up Falls evaluation completed;Education provided;Falls prevention discussed - - - -    FALL RISK PREVENTION PERTAINING TO THE HOME:  Any stairs in or around the home? Yes  If so, are there any without handrails? No  Home free of loose throw rugs in walkways, pet beds, electrical cords, etc? Yes  Adequate lighting in your home to reduce risk of falls? Yes   ASSISTIVE DEVICES UTILIZED TO PREVENT FALLS:  Life alert? No  Use of a cane, walker or w/c? No  Grab bars in the bathroom? No  Shower chair or bench in shower? No  Elevated toilet seat or a handicapped toilet? No   TIMED UP AND GO:  Was the test performed? No   Cognitive Function:     6CIT Screen 06/23/2020 06/14/2019 05/18/2018 05/08/2017  What Year? 0 points 0 points 0 points 0 points  What month? 0 points 0 points 0 points 0 points  What time? 0 points 0 points 0 points 0 points  Count back from 20 0 points 0 points 0 points 0 points  Months in reverse 0 points 0 points 0 points 0 points   Repeat phrase 0 points 0 points 0 points 0 points  Total Score 0 0 0 0    Immunizations Immunization History  Administered Date(s) Administered  . DTaP 08/27/2010  . Fluad Quad(high Dose 65+) 11/11/2018, 03/21/2020  . Influenza, High Dose Seasonal PF 11/11/2016, 01/08/2018  . Influenza,inj,Quad PF,6+ Mos 01/24/2015  . Influenza-Unspecified 02/11/2014, 12/12/2015  . PFIZER(Purple Top)SARS-COV-2 Vaccination 05/31/2019, 06/29/2019, 12/31/2019  . Pneumococcal Conjugate-13 02/11/2014  . Pneumococcal Polysaccharide-23 05/01/2015  . Tdap 08/27/2010  . Zoster 09/11/2011    TDAP status: Up to date  Flu Vaccine status: Up to date  Pneumococcal vaccine status: Up to date  Covid-19 vaccine status: Completed vaccines  Qualifies for Shingles Vaccine? Yes   Zostavax completed Yes   Shingrix Completed?: No.    Education has been provided regarding the importance of this vaccine. Patient has been advised to call insurance company to determine out of pocket expense if they have not yet received this vaccine. Advised may also receive vaccine at local pharmacy or Health Dept. Verbalized acceptance and understanding.  Screening Tests Health Maintenance  Topic Date Due  . MAMMOGRAM  05/28/2019  . TETANUS/TDAP  08/26/2020  . INFLUENZA VACCINE  10/02/2020  . COLONOSCOPY (Pts 45-71yrs Insurance coverage will need to be confirmed)  05/16/2025  . DEXA SCAN  Completed  . COVID-19 Vaccine  Completed  . Hepatitis C Screening  Completed  . PNA vac Low Risk Adult  Completed  . HPV VACCINES  Aged Out    Health Maintenance  Health Maintenance Due  Topic Date Due  . MAMMOGRAM  05/28/2019    Colorectal cancer screening: Type of screening: Colonoscopy. Completed 05/16/2020. Repeat every 5 years  Mammogram status: patient to schedule  Bone Density status: Completed 05/27/2017.   Lung Cancer Screening: (Low Dose CT Chest recommended if Age 79-80 years, 30 pack-year currently smoking OR have quit  w/in 15years.) does not qualify.   Lung Cancer Screening Referral: no  Additional Screening:  Hepatitis C Screening: does qualify; Completed 05/01/2015  Vision Screening: Recommended annual ophthalmology exams for early detection of glaucoma and other disorders of the eye. Is the patient up to date with their annual eye exam?  No  Who is the provider or what is the name of the office in which the patient attends annual eye exams? [facility] If pt is not established with a provider, would they like to be referred to a provider to establish care? No .   Dental Screening: Recommended annual dental exams for proper oral hygiene  Community Resource Referral / Chronic Care Management: CRR required this visit?  No   CCM required this visit?  No      Plan:     I have personally reviewed and noted the following in the patient's chart:   . Medical and social history . Use of alcohol, tobacco or illicit drugs  . Current medications and supplements . Functional ability and status . Nutritional status . Physical activity . Advanced directives . List of other physicians . Hospitalizations, surgeries, and ER visits in previous 12 months . Vitals . Screenings to include cognitive, depression, and falls . Referrals and appointments  In addition, I have reviewed and discussed with patient certain preventive protocols, quality metrics, and best practice recommendations. A written personalized care plan for preventive services as well as general preventive health recommendations were provided to patient.     Kellie Simmering, LPN   04/13/4707   Nurse Notes:

## 2020-06-23 NOTE — Patient Instructions (Signed)
Tabitha Larson , Thank you for taking time to come for your Medicare Wellness Visit. I appreciate your ongoing commitment to your health goals. Please review the following plan we discussed and let me know if I can assist you in the future.   Screening recommendations/referrals: Colonoscopy: completed 05/16/2020, due 05/16/2025 Mammogram: patient to schedule Bone Density: completed 05/27/2017 Recommended yearly ophthalmology/optometry visit for glaucoma screening and checkup Recommended yearly dental visit for hygiene and checkup  Vaccinations: Influenza vaccine: completed 03/21/2020 Pneumococcal vaccine: completed 05/01/2015 Tdap vaccine: completed 08/27/2010, due 08/26/2020 Shingles vaccine: discussed   Covid-19: 12/31/2019, 06/29/2019, 05/31/2019  Advanced directives: Please bring a copy of your POA (Power of Attorney) and/or Living Will to your next appointment.   Conditions/risks identified: none  Next appointment: Follow up in one year for your annual wellness visit    Preventive Care 65 Years and Older, Female Preventive care refers to lifestyle choices and visits with your health care provider that can promote health and wellness. What does preventive care include?  A yearly physical exam. This is also called an annual well check.  Dental exams once or twice a year.  Routine eye exams. Ask your health care provider how often you should have your eyes checked.  Personal lifestyle choices, including:  Daily care of your teeth and gums.  Regular physical activity.  Eating a healthy diet.  Avoiding tobacco and drug use.  Limiting alcohol use.  Practicing safe sex.  Taking low-dose aspirin every day.  Taking vitamin and mineral supplements as recommended by your health care provider. What happens during an annual well check? The services and screenings done by your health care provider during your annual well check will depend on your age, overall health, lifestyle risk  factors, and family history of disease. Counseling  Your health care provider may ask you questions about your:  Alcohol use.  Tobacco use.  Drug use.  Emotional well-being.  Home and relationship well-being.  Sexual activity.  Eating habits.  History of falls.  Memory and ability to understand (cognition).  Work and work Statistician.  Reproductive health. Screening  You may have the following tests or measurements:  Height, weight, and BMI.  Blood pressure.  Lipid and cholesterol levels. These may be checked every 5 years, or more frequently if you are over 3 years old.  Skin check.  Lung cancer screening. You may have this screening every year starting at age 72 if you have a 30-pack-year history of smoking and currently smoke or have quit within the past 15 years.  Fecal occult blood test (FOBT) of the stool. You may have this test every year starting at age 3.  Flexible sigmoidoscopy or colonoscopy. You may have a sigmoidoscopy every 5 years or a colonoscopy every 10 years starting at age 31.  Hepatitis C blood test.  Hepatitis B blood test.  Sexually transmitted disease (STD) testing.  Diabetes screening. This is done by checking your blood sugar (glucose) after you have not eaten for a while (fasting). You may have this done every 1-3 years.  Bone density scan. This is done to screen for osteoporosis. You may have this done starting at age 63.  Mammogram. This may be done every 1-2 years. Talk to your health care provider about how often you should have regular mammograms. Talk with your health care provider about your test results, treatment options, and if necessary, the need for more tests. Vaccines  Your health care provider may recommend certain vaccines, such as:  Influenza vaccine. This is recommended every year.  Tetanus, diphtheria, and acellular pertussis (Tdap, Td) vaccine. You may need a Td booster every 10 years.  Zoster vaccine. You  may need this after age 79.  Pneumococcal 13-valent conjugate (PCV13) vaccine. One dose is recommended after age 25.  Pneumococcal polysaccharide (PPSV23) vaccine. One dose is recommended after age 69. Talk to your health care provider about which screenings and vaccines you need and how often you need them. This information is not intended to replace advice given to you by your health care provider. Make sure you discuss any questions you have with your health care provider. Document Released: 03/17/2015 Document Revised: 11/08/2015 Document Reviewed: 12/20/2014 Elsevier Interactive Patient Education  2017 [place] Prevention in the Home Falls can cause injuries. They can happen to people of all ages. There are many things you can do to make your home safe and to help prevent falls. What can I do on the outside of my home?  Regularly fix the edges of walkways and driveways and fix any cracks.  Remove anything that might make you trip as you walk through a door, such as a raised step or threshold.  Trim any bushes or trees on the path to your home.  Use bright outdoor lighting.  Clear any walking paths of anything that might make someone trip, such as rocks or tools.  Regularly check to see if handrails are loose or broken. Make sure that both sides of any steps have handrails.  Any raised decks and porches should have guardrails on the edges.  Have any leaves, snow, or ice cleared regularly.  Use sand or salt on walking paths during winter.  Clean up any spills in your garage right away. This includes oil or grease spills. What can I do in the bathroom?  Use night lights.  Install grab bars by the toilet and in the tub and shower. Do not use towel bars as grab bars.  Use non-skid mats or decals in the tub or shower.  If you need to sit down in the shower, use a plastic, non-slip stool.  Keep the floor dry. Clean up any water that spills on the floor as soon as  it happens.  Remove soap buildup in the tub or shower regularly.  Attach bath mats securely with double-sided non-slip rug tape.  Do not have throw rugs and other things on the floor that can make you trip. What can I do in the bedroom?  Use night lights.  Make sure that you have a light by your bed that is easy to reach.  Do not use any sheets or blankets that are too big for your bed. They should not hang down onto the floor.  Have a firm chair that has side arms. You can use this for support while you get dressed.  Do not have throw rugs and other things on the floor that can make you trip. What can I do in the kitchen?  Clean up any spills right away.  Avoid walking on wet floors.  Keep items that you use a lot in easy-to-reach places.  If you need to reach something above you, use a strong step stool that has a grab bar.  Keep electrical cords out of the way.  Do not use floor polish or wax that makes floors slippery. If you must use wax, use non-skid floor wax.  Do not have throw rugs and other things on the floor that  can make you trip. What can I do with my stairs?  Do not leave any items on the stairs.  Make sure that there are handrails on both sides of the stairs and use them. Fix handrails that are broken or loose. Make sure that handrails are as long as the stairways.  Check any carpeting to make sure that it is firmly attached to the stairs. Fix any carpet that is loose or worn.  Avoid having throw rugs at the top or bottom of the stairs. If you do have throw rugs, attach them to the floor with carpet tape.  Make sure that you have a light switch at the top of the stairs and the bottom of the stairs. If you do not have them, ask someone to add them for you. What else can I do to help prevent falls?  Wear shoes that:  Do not have high heels.  Have rubber bottoms.  Are comfortable and fit you well.  Are closed at the toe. Do not wear sandals.  If  you use a stepladder:  Make sure that it is fully opened. Do not climb a closed stepladder.  Make sure that both sides of the stepladder are locked into place.  Ask someone to hold it for you, if possible.  Clearly mark and make sure that you can see:  Any grab bars or handrails.  First and last steps.  Where the edge of each step is.  Use tools that help you move around (mobility aids) if they are needed. These include:  Canes.  Walkers.  Scooters.  Crutches.  Turn on the lights when you go into a dark area. Replace any light bulbs as soon as they burn out.  Set up your furniture so you have a clear path. Avoid moving your furniture around.  If any of your floors are uneven, fix them.  If there are any pets around you, be aware of where they are.  Review your medicines with your doctor. Some medicines can make you feel dizzy. This can increase your chance of falling. Ask your doctor what other things that you can do to help prevent falls. This information is not intended to replace advice given to you by your health care provider. Make sure you discuss any questions you have with your health care provider. Document Released: 12/15/2008 Document Revised: 07/27/2015 Document Reviewed: 03/25/2014 Elsevier Interactive Patient Education  2017 [employer].

## 2020-07-25 ENCOUNTER — Other Ambulatory Visit: Payer: Self-pay | Admitting: Nurse Practitioner

## 2020-07-25 NOTE — Telephone Encounter (Signed)
Scheduled 8/15

## 2020-07-27 ENCOUNTER — Other Ambulatory Visit: Payer: Self-pay

## 2020-07-27 DIAGNOSIS — Z1231 Encounter for screening mammogram for malignant neoplasm of breast: Secondary | ICD-10-CM

## 2020-08-21 ENCOUNTER — Ambulatory Visit: Payer: Medicare PPO | Admitting: Nurse Practitioner

## 2020-08-21 ENCOUNTER — Other Ambulatory Visit: Payer: Self-pay

## 2020-08-21 ENCOUNTER — Encounter: Payer: Self-pay | Admitting: Nurse Practitioner

## 2020-08-21 ENCOUNTER — Other Ambulatory Visit: Payer: Self-pay | Admitting: Nurse Practitioner

## 2020-08-21 VITALS — BP 119/67 | HR 60 | Temp 98.0°F | Wt 141.4 lb

## 2020-08-21 DIAGNOSIS — N644 Mastodynia: Secondary | ICD-10-CM | POA: Diagnosis not present

## 2020-08-21 NOTE — Telephone Encounter (Signed)
Notes to clinic: Patient has appt today    Requested Prescriptions  Pending Prescriptions Disp Refills   atorvastatin (LIPITOR) 40 MG tablet [Pharmacy Med Name: Atorvastatin Calcium 40 MG Oral Tablet] 90 tablet 0    Sig: Take 1 tablet by mouth once daily      Cardiovascular:  Antilipid - Statins Failed - 08/21/2020  8:25 AM      Failed - LDL in normal range and within 360 days    LDL Chol Calc (NIH)  Date Value Ref Range Status  03/08/2020 101 (H) 0 - 99 mg/dL Final          Failed - Triglycerides in normal range and within 360 days    Triglycerides  Date Value Ref Range Status  03/08/2020 159 (H) 0 - 149 mg/dL Final   Triglycerides Piccolo,Waived  Date Value Ref Range Status  01/24/2015 133 <150 mg/dL Final    Comment:                            Normal                   <150                         Borderline High     150 - 199                         High                200 - 499                         Very High                >499           Passed - Total Cholesterol in normal range and within 360 days    Cholesterol, Total  Date Value Ref Range Status  03/08/2020 172 100 - 199 mg/dL Final   Cholesterol Piccolo, Waived  Date Value Ref Range Status  01/24/2015 185 <200 mg/dL Final    Comment:                            Desirable                <200                         Borderline High      200- 239                         High                     >239           Passed - HDL in normal range and within 360 days    HDL  Date Value Ref Range Status  03/08/2020 43 >39 mg/dL Final          Passed - Patient is not pregnant      Passed - Valid encounter within last 12 months    Recent Outpatient Visits           4 months ago Chronic migraine without aura without status migrainosus, not intractable  [place], [place] T, NP   5 months ago Severe episode of recurrent major depressive disorder, without psychotic features ([place])    [place], Jolene T, NP   5 months ago Chronic migraine without aura without status migrainosus, not intractable   [place], Jolene T, NP   10 months ago Severe episode of recurrent major depressive disorder, without psychotic features ([place])   [place] Cannady, Jolene T, NP   11 months ago Severe episode of recurrent major depressive disorder, without psychotic features ([place])   [place], [place] T, NP       Future Appointments             Today Cannady, Barbaraann Faster, NP [place]   In 1 month [place], [place] T, NP [facility], [facility]   In 10 months  [facility]               amLODipine (NORVASC) 5 MG tablet [employer] Med Name: amLODIPine Besylate 5 MG Oral Tablet] 90 tablet 0    Sig: Take 1 tablet by mouth once daily      Cardiovascular:  Calcium Channel Blockers Failed - 08/21/2020  8:25 AM      Failed - Last BP in normal range    BP Readings from Last 1 Encounters:  05/16/20 (!) 143/82          Passed - Valid encounter within last 6 months    Recent Outpatient Visits           4 months ago Chronic migraine without aura without status migrainosus, not intractable   [place], Jolene T, NP   5 months ago Severe episode of recurrent major depressive disorder, without psychotic features ([place])   [place], Jolene T, NP   5 months ago Chronic migraine without aura without status migrainosus, not intractable   [place], Jolene T, NP   10 months ago Severe episode of recurrent major depressive disorder, without psychotic features ([place])   [place] Cannady, Jolene T, NP   11 months ago Severe episode of recurrent major depressive disorder, without psychotic features ([place])   [place], Barbaraann Faster, NP       Future Appointments              Today Venita Lick, NP [place], [facility]   In 1 month Cannady, Barbaraann Faster, NP [facility], [facility]   In 10 months  [facility]

## 2020-08-21 NOTE — Telephone Encounter (Signed)
Scheduled today

## 2020-08-21 NOTE — Patient Instructions (Signed)
[facility]  Address: [address], [place], [place] [address]  Phone: [phone]  

## 2020-08-21 NOTE — Progress Notes (Signed)
Acute Office Visit  Subjective:    Patient ID: Tabitha Larson, female    DOB: [birth year], 70 y.o.   MRN: [id number]  Chief Complaint  Patient presents with   Breast Pain    Patient states she has been having left breast pain on the side of her breast for the last few months     HPI Patient is in today for pain in left breast for the last few months.  BREAST PAIN  Duration :months Location: left, lateral Onset: gradual Severity: 4/10 Quality: dull Frequency: intermittent Redness: no Swelling: no Trauma: no trauma Breastfeeding: no Associated with menstral cycle: no Nipple discharge: no Breast lump: no Status: fluctuating Treatments attempted: none Previous mammogram: yes, however it has been a while   Past Medical History:  Diagnosis Date   Ankle fracture    Anxiety    Depression    GERD (gastroesophageal reflux disease)    Hyperlipidemia    Hypertension    Migraine    Osteoporosis    PONV (postoperative nausea and vomiting)    after ankle repair    Wears dentures    full upper and lower    Past Surgical History:  Procedure Laterality Date   ANKLE FRACTURE SURGERY     CATARACT EXTRACTION Right 01/2014   CATARACT EXTRACTION W/PHACO Left 03/26/2016   Procedure: CATARACT EXTRACTION PHACO AND INTRAOCULAR LENS PLACEMENT ([place])  left;  Surgeon: Eulogio Bear, MD;  Location: [place];  Service: Ophthalmology;  Laterality: Left;   COLONOSCOPY  02/04/2014   COLONOSCOPY WITH PROPOFOL N/A 07/28/2015   Procedure: COLONOSCOPY WITH PROPOFOL;  Surgeon: Lucilla Lame, MD;  Location: [place];  Service: Endoscopy;  Laterality: N/A;  No anesthesia   COLONOSCOPY WITH PROPOFOL N/A 05/16/2020   Procedure: COLONOSCOPY WITH PROPOFOL;  Surgeon: Lucilla Lame, MD;  Location: [facility] ENDOSCOPY;  Service: Endoscopy;  Laterality: N/A;   ESOPHAGOGASTRODUODENOSCOPY (EGD) WITH PROPOFOL N/A 12/17/2018   Procedure: ESOPHAGOGASTRODUODENOSCOPY (EGD) WITH Dilation;   Surgeon: Lucilla Lame, MD;  Location: [place];  Service: Endoscopy;  Laterality: N/A;  requests mid-morning arrival   MANDIBLE SURGERY     POLYPECTOMY  07/28/2015   Procedure: POLYPECTOMY;  Surgeon: Lucilla Lame, MD;  Location: [place];  Service: Endoscopy;;    Family History  Problem Relation Age of Onset   Stroke Mother    Prostate cancer Father    Colon cancer Sister    Hypertension Sister    Hyperlipidemia Sister    Breast cancer Neg Hx     Social History   Socioeconomic History   Marital status: Divorced    Spouse name: Not on file   Number of children: Not on file   Years of education: Not on file   Highest education level: High school graduate  Occupational History   Occupation: retired  Tobacco Use   Smoking status: Former    Pack years: 0.00    Types: Cigarettes    Quit date: 01/05/1984    Years since quitting: 36.6   Smokeless tobacco: Never  Vaping Use   Vaping Use: Never used  Substance and Sexual Activity   Alcohol use: No    Alcohol/week: 0.0 standard drinks   Drug use: No   Sexual activity: Not Currently    Birth control/protection: Post-menopausal  Other Topics Concern   Not on file  Social History Narrative   Niece lives in area   No kids   Has friends close by   [place] live with  her currently    Social Determinants of Health   Financial Resource Strain: Low Risk    Difficulty of Paying Living Expenses: Not hard at all  Food Insecurity: No Food Insecurity   Worried About Charity fundraiser in the Last Year: Never true   Arboriculturist in the Last Year: Never true  Transportation Needs: No Transportation Needs   Lack of Transportation (Medical): No   Lack of Transportation (Non-Medical): No  Physical Activity: Inactive   Days of Exercise per Week: 0 days   Minutes of Exercise per Session: 0 min  Stress: No Stress Concern Present   Feeling of Stress : Only a little  Social Connections: Not on file  Intimate  Partner Violence: Not on file    Outpatient Medications Prior to Visit  Medication Sig Dispense Refill   amLODipine (NORVASC) 5 MG tablet Take 1 tablet by mouth once daily 90 tablet 0   atorvastatin (LIPITOR) 40 MG tablet Take 1 tablet by mouth once daily 90 tablet 0   hydrochlorothiazide (HYDRODIURIL) 25 MG tablet Take 1 tablet by mouth once daily 90 tablet 3   pantoprazole (PROTONIX) 20 MG tablet Take 1 tablet by mouth once daily 90 tablet 4   sertraline (ZOLOFT) 100 MG tablet Take 1 tablet by mouth once daily 90 tablet 0   propranolol (INDERAL) 10 MG tablet Take 1 tablet (10 mg total) by mouth daily. (Patient not taking: No sig reported) 30 tablet 4   Magnesium 100 MG CAPS Take 1 capsule by mouth daily.  (Patient not taking: No sig reported)     vitamin B-12 (CYANOCOBALAMIN) 500 MCG tablet Take 500 mcg by mouth daily. (Patient not taking: No sig reported)     No facility-administered medications prior to visit.    No Known Allergies  Review of Systems  Constitutional:  Positive for fatigue.  Respiratory: Negative.    Cardiovascular: Negative.   Gastrointestinal: Negative.   Genitourinary: Negative.   Musculoskeletal:  Positive for back pain.  Skin: Negative.   Neurological: Negative.       Objective:    Physical Exam Vitals and nursing note reviewed. Exam conducted with a chaperone present.  Constitutional:      General: She is not in acute distress.    Appearance: Normal appearance.  HENT:     Head: Normocephalic.  Eyes:     Conjunctiva/sclera: Conjunctivae normal.  Cardiovascular:     Rate and Rhythm: Normal rate and regular rhythm.     Pulses: Normal pulses.     Heart sounds: Normal heart sounds.  Pulmonary:     Effort: Pulmonary effort is normal.     Breath sounds: Normal breath sounds.  Chest:     Chest wall: No mass, swelling or tenderness.  Breasts:    Right: Normal. No mass, nipple discharge, skin change, tenderness or axillary adenopathy.     Left:  Normal. No mass, nipple discharge, tenderness or axillary adenopathy.  Abdominal:     Palpations: Abdomen is soft.     Tenderness: There is no abdominal tenderness.  Musculoskeletal:     Cervical back: Normal range of motion.  Lymphadenopathy:     Upper Body:     Right upper body: No axillary adenopathy.     Left upper body: No axillary adenopathy.  Skin:    General: Skin is warm.  Neurological:     General: No focal deficit present.     Mental Status: She is alert and oriented to person,  place, and time.  Psychiatric:        Mood and Affect: Mood normal.        Behavior: Behavior normal.        Thought Content: Thought content normal.        Judgment: Judgment normal.    BP 119/67   Pulse 60   Temp 98 F (36.7 C) (Oral)   Wt 141 lb 6.4 oz (64.1 kg)   LMP  (LMP Unknown)   SpO2 98%   BMI 25.86 kg/m  Wt Readings from Last 3 Encounters:  08/21/20 141 lb 6.4 oz (64.1 kg)  06/23/20 140 lb (63.5 kg)  05/16/20 140 lb (63.5 kg)    Health Maintenance Due  Topic Date Due   Zoster Vaccines- Shingrix (1 of 2) Never done   MAMMOGRAM  05/28/2019   COVID-19 Vaccine (4 - Booster for Pfizer series) 05/01/2020    There are no preventive care reminders to display for this patient.   Lab Results  Component Value Date   TSH 2.840 03/08/2020   Lab Results  Component Value Date   WBC 6.7 03/08/2020   HGB 14.2 03/08/2020   HCT 43.3 03/08/2020   MCV 86 03/08/2020   PLT 214 03/08/2020   Lab Results  Component Value Date   NA 139 03/08/2020   K 4.0 03/08/2020   CO2 21 03/08/2020   GLUCOSE 77 03/08/2020   BUN 15 03/08/2020   CREATININE 0.66 03/08/2020   BILITOT 0.3 03/08/2020   ALKPHOS 80 03/08/2020   AST 14 03/08/2020   ALT 17 03/08/2020   PROT 7.4 03/08/2020   ALBUMIN 4.5 03/08/2020   CALCIUM 9.6 03/08/2020   Lab Results  Component Value Date   CHOL 172 03/08/2020   Lab Results  Component Value Date   HDL 43 03/08/2020   Lab Results  Component Value Date    LDLCALC 101 (H) 03/08/2020   Lab Results  Component Value Date   TRIG 159 (H) 03/08/2020   Lab Results  Component Value Date   CHOLHDL 4.0 01/08/2018   Lab Results  Component Value Date   HGBA1C 5.7 03/08/2020       Assessment & Plan:   Problem List Items Addressed This Visit   None Visit Diagnoses     Breast pain, left    -  Primary   No masses felt on palpation. Will order bilateral diagnostic mammogram and left breast ultrasound. Will follow-up based on results.    Relevant Orders   MM DIAG BREAST TOMO BILATERAL   US BREAST LTD UNI LEFT INC AXILLA        No orders of the defined types were placed in this encounter.    Charyl Dancer, NP

## 2020-08-27 ENCOUNTER — Other Ambulatory Visit: Payer: Self-pay | Admitting: Nurse Practitioner

## 2020-08-27 NOTE — Telephone Encounter (Signed)
Requested Prescriptions  Pending Prescriptions Disp Refills  . amLODipine (NORVASC) 5 MG tablet [Pharmacy Med Name: amLODIPine Besylate 5 MG Oral Tablet] 90 tablet 0    Sig: Take 1 tablet by mouth once daily     Cardiovascular:  Calcium Channel Blockers Passed - 08/27/2020 12:42 PM      Passed - Last BP in normal range    BP Readings from Last 1 Encounters:  08/21/20 119/67         Passed - Valid encounter within last 6 months    Recent Outpatient Visits          6 days ago Breast pain, left   [facility] McElwee, Lauren A, NP   4 months ago Chronic migraine without aura without status migrainosus, not intractable   [place], Jolene T, NP   5 months ago Severe episode of recurrent major depressive disorder, without psychotic features ([place])   [place], Jolene T, NP   5 months ago Chronic migraine without aura without status migrainosus, not intractable   [place], Jolene T, NP   10 months ago Severe episode of recurrent major depressive disorder, without psychotic features ([place])   [place], Barbaraann Faster, NP      Future Appointments            In 45 month Cannady, Barbaraann Faster, NP [facility]   In 10 months  [facility]           . atorvastatin (LIPITOR) 40 MG tablet [Pharmacy Med Name: Atorvastatin Calcium 40 MG Oral Tablet] 90 tablet 0    Sig: Take 1 tablet by mouth once daily     Cardiovascular:  Antilipid - Statins Failed - 08/27/2020 12:42 PM      Failed - LDL in normal range and within 360 days    LDL Chol Calc (NIH)  Date Value Ref Range Status  03/08/2020 101 (H) 0 - 99 mg/dL Final         Failed - Triglycerides in normal range and within 360 days    Triglycerides  Date Value Ref Range Status  03/08/2020 159 (H) 0 - 149 mg/dL Final   Triglycerides Piccolo,Waived  Date Value Ref Range Status  01/24/2015 133 <150 mg/dL Final     Comment:                            Normal                   <150                         Borderline High     150 - 199                         High                200 - 499                         Very High                >499          Passed - Total Cholesterol in normal range and within 360 days    Cholesterol, Total  Date Value Ref Range Status  03/08/2020 172 100 - 199 mg/dL Final   Cholesterol Piccolo, Waived  Date Value Ref Range Status  01/24/2015 185 <200 mg/dL Final    Comment:                            Desirable                <200                         Borderline High      200- 239                         High                     >239          Passed - HDL in normal range and within 360 days    HDL  Date Value Ref Range Status  03/08/2020 43 >39 mg/dL Final         Passed - Patient is not pregnant      Passed - Valid encounter within last 12 months    Recent Outpatient Visits          6 days ago Breast pain, left   [facility] McElwee, Lauren A, NP   4 months ago Chronic migraine without aura without status migrainosus, not intractable   [place], Jolene T, NP   5 months ago Severe episode of recurrent major depressive disorder, without psychotic features ([place])   [place], Jolene T, NP   5 months ago Chronic migraine without aura without status migrainosus, not intractable   [place], Jolene T, NP   10 months ago Severe episode of recurrent major depressive disorder, without psychotic features ([place])   [place], Barbaraann Faster, NP      Future Appointments            In 30 month Cannady, Barbaraann Faster, NP [facility]   In 10 months  [facility]           . sertraline (ZOLOFT) 100 MG tablet [Pharmacy Med Name: Sertraline HCl 100 MG Oral Tablet] 90 tablet 0    Sig: Take 1 tablet by mouth once daily     Psychiatry:   Antidepressants - SSRI Passed - 08/27/2020 12:42 PM      Passed - Completed PHQ-2 or PHQ-9 in the last 360 days      Passed - Valid encounter within last 6 months    Recent Outpatient Visits          6 days ago Breast pain, left   [facility] McElwee, Lauren A, NP   4 months ago Chronic migraine without aura without status migrainosus, not intractable   [place], Jolene T, NP   5 months ago Severe episode of recurrent major depressive disorder, without psychotic features ([place])   [place], Jolene T, NP   5 months ago Chronic migraine without aura without status migrainosus, not intractable   [place], Jolene T, NP   10 months ago Severe episode of recurrent major depressive disorder, without psychotic features ([place])   [place], Jolene T, NP      Future Appointments  In 1 month Cannady, Barbaraann Faster, NP [facility]   In 10 months  [facility]

## 2020-08-29 ENCOUNTER — Ambulatory Visit
Admission: RE | Admit: 2020-08-29 | Discharge: 2020-08-29 | Disposition: A | Discharge status: Home / Self Care | Payer: Medicare PPO | Source: Ambulatory Visit | Attending: Nurse Practitioner | Admitting: Nurse Practitioner

## 2020-08-29 ENCOUNTER — Other Ambulatory Visit: Payer: Self-pay

## 2020-08-29 DIAGNOSIS — N644 Mastodynia: Secondary | ICD-10-CM

## 2020-10-16 ENCOUNTER — Encounter: Payer: Self-pay | Admitting: Nurse Practitioner

## 2020-10-16 ENCOUNTER — Ambulatory Visit: Payer: Medicare PPO | Admitting: Nurse Practitioner

## 2020-10-16 ENCOUNTER — Other Ambulatory Visit: Payer: Self-pay

## 2020-10-16 VITALS — BP 123/64 | HR 62 | Temp 98.6°F | Wt 138.2 lb

## 2020-10-16 DIAGNOSIS — K219 Gastro-esophageal reflux disease without esophagitis: Secondary | ICD-10-CM | POA: Diagnosis not present

## 2020-10-16 DIAGNOSIS — R7309 Other abnormal glucose: Secondary | ICD-10-CM

## 2020-10-16 DIAGNOSIS — E7800 Pure hypercholesterolemia, unspecified: Secondary | ICD-10-CM | POA: Diagnosis not present

## 2020-10-16 DIAGNOSIS — F332 Major depressive disorder, recurrent severe without psychotic features: Secondary | ICD-10-CM

## 2020-10-16 DIAGNOSIS — R5383 Other fatigue: Secondary | ICD-10-CM

## 2020-10-16 DIAGNOSIS — E538 Deficiency of other specified B group vitamins: Secondary | ICD-10-CM

## 2020-10-16 DIAGNOSIS — I10 Essential (primary) hypertension: Secondary | ICD-10-CM | POA: Diagnosis not present

## 2020-10-16 NOTE — Progress Notes (Signed)
BP 123/64   Pulse 62 Comment: apical  Temp 98.6 F (37 C) (Oral)   Wt 138 lb 3.2 oz (62.7 kg)   LMP  (LMP Unknown)   SpO2 97%   BMI 25.28 kg/m    Subjective:    Patient ID: Tabitha Larson, female    DOB: [birth year], 70 y.o.   MRN: [id number]  HPI: Tabitha Larson is a 70 y.o. female  Chief Complaint  Patient presents with   Follow-up    Patient states she is here for a follow up and would like to discuss an ongoing issue she has been dealing with. Patient states she still has issues when she wakes up in the morning and has to sit there for a while before she can get up to get herself started for the day. Patient states she eats a lot of sugar and wonders if that has something to do with it as far as feeling fatigue constantly.    HYPERTENSION / HYPERLIPIDEMIA Continues on Amlodipine & HCTZ and Atorvastatin. Satisfied with current treatment? yes Duration of hypertension: chronic BP monitoring frequency: not checking BP range:  BP medication side effects: no Duration of hyperlipidemia: chronic Cholesterol medication side effects: no Cholesterol supplements: none Medication compliance: good compliance Aspirin: no Recent stressors: no Recurrent headaches: no Visual changes: no Palpitations: no Dyspnea: no Chest pain: no Lower extremity edema: no Dizzy/lightheaded: no  The 10-year ASCVD risk score Mikey Bussing DC Jr., et al., 2013) is: 12.9%   Values used to calculate the score:     Age: 11 years     Sex: Female     Is Non-Hispanic African American: No     Diabetic: No     Tobacco smoker: No     Systolic Blood Pressure: [id number] mmHg     Is BP treated: Yes     HDL Cholesterol: 43 mg/dL     Total Cholesterol: 172 mg/dL   GERD Continues Protonix on daily basis. GERD control status: stable  Satisfied with current treatment? yes Heartburn frequency: every day without medicine, with medicine hardly ever had Medication side effects: no  Medication compliance:  stable Previous GERD medications: TUMS Antacid use frequency: every day Dysphagia: no Odynophagia:  no Hematemesis: no Blood in stool: no EGD: yes, October 2020  DEPRESSION Continues on Sertraline.  Denies SI/HI.   Mood status: stable Satisfied with current treatment?: yes Symptom severity: mild  Duration of current treatment : chronic Side effects: no Medication compliance: good compliance Psychotherapy/counseling: none Depressed mood:  occasional Anxious mood: no Anhedonia: no Significant weight loss or gain: no Insomnia: none Fatigue: occasional Feelings of worthlessness or guilt: no Impaired concentration/indecisiveness: no Suicidal ideations: no Hopelessness: no Crying spells: no Depression screen [facility] 2/9 10/16/2020 06/23/2020 04/17/2020 03/21/2020 03/08/2020  Decreased Interest 0 0 0 0 0  Down, Depressed, Hopeless 1 3 0 0 1  PHQ - 2 Score 1 3 0 0 1  Altered sleeping 3 0 0 - 0  Tired, decreased energy 3 1 0 - 2  Change in appetite 3 3 0 - 0  Feeling bad or failure about yourself  1 0 0 - 3  Trouble concentrating 1 0 - - -  Moving slowly or fidgety/restless 0 0 0 - 2  Suicidal thoughts 0 0 0 - 0  PHQ-9 Score 12 7 0 - 8  Difficult doing work/chores Not difficult at all Somewhat difficult - - -  Some recent data might be hidden   FATIGUE Ongoing issue  waxing and waning  > 1 year.  Labs have shown A1c 5.7% in January 2022 - - B12 372 and remainder of labs unremarkable, including inflammatory markers.  Carotid ultrasound notes moderate atherosclerotic plaque, but no stenosis on 03/22/20. She reports the fatigue is not worsening, is staying the same.  Has had sleep apnea test about 4-5 years ago which she reports was normal. Duration:  chronic Severity: mild -- hard to get up and go in morning -- lasts one hour, after she gets her coffee and sits, she is okay Onset: gradual Context when symptoms started:  unknown Symptoms improve with rest: yes Depressive symptoms: at  times Stress/anxiety: a little bit  Insomnia: no none Snoring: no Observed apnea by bed partner: no Daytime hypersomnolence:no Wakes feeling refreshed: yes History of sleep study: yes Dysnea on exertion:  no Orthopnea/PND: no -- sleeps on two pillows at baseline Chest pain: no Chronic cough: no Lower extremity edema: no Arthralgias:no Myalgias: no Weakness: no Rash: no  Relevant past medical, surgical, family and social history reviewed and updated as indicated. Interim medical history since our last visit reviewed. Allergies and medications reviewed and updated.  Review of Systems  Constitutional:  Positive for fatigue. Negative for activity change, appetite change, diaphoresis and fever.  Respiratory:  Negative for cough, chest tightness and shortness of breath.   Cardiovascular:  Negative for chest pain, palpitations and leg swelling.  Gastrointestinal: Negative.   Endocrine: Negative for cold intolerance and heat intolerance.  Neurological: Negative.   Psychiatric/Behavioral:  Negative for decreased concentration, self-injury, sleep disturbance and suicidal ideas. The patient is not nervous/anxious.    Per HPI unless specifically indicated above     Objective:    BP 123/64   Pulse 62 Comment: apical  Temp 98.6 F (37 C) (Oral)   Wt 138 lb 3.2 oz (62.7 kg)   LMP  (LMP Unknown)   SpO2 97%   BMI 25.28 kg/m   Wt Readings from Last 3 Encounters:  10/16/20 138 lb 3.2 oz (62.7 kg)  08/21/20 141 lb 6.4 oz (64.1 kg)  06/23/20 140 lb (63.5 kg)    Physical Exam Vitals and nursing note reviewed.  Constitutional:      General: She is awake. She is not in acute distress.    Appearance: She is well-developed and well-groomed. She is not ill-appearing or toxic-appearing.  HENT:     Head: Normocephalic.     Right Ear: Hearing normal.     Left Ear: Hearing normal.  Eyes:     General: Lids are normal.        Right eye: No discharge.        Left eye: No discharge.      Conjunctiva/sclera: Conjunctivae normal.     Pupils: Pupils are equal, round, and reactive to light.  Neck:     Thyroid: No thyromegaly.     Vascular: No carotid bruit.  Cardiovascular:     Rate and Rhythm: Normal rate and regular rhythm.     Heart sounds: Normal heart sounds. No murmur heard.   No gallop.  Pulmonary:     Effort: Pulmonary effort is normal. No accessory muscle usage or respiratory distress.     Breath sounds: Normal breath sounds.  Abdominal:     General: Bowel sounds are normal.     Palpations: Abdomen is soft.  Musculoskeletal:     Cervical back: Normal range of motion and neck supple.     Right lower leg: No edema.  Left lower leg: No edema.  Lymphadenopathy:     Cervical: No cervical adenopathy.  Skin:    General: Skin is warm and dry.  Neurological:     Mental Status: She is alert and oriented to person, place, and time.  Psychiatric:        Attention and Perception: Attention normal.        Mood and Affect: Mood normal.        Speech: Speech normal.        Behavior: Behavior normal. Behavior is cooperative.        Thought Content: Thought content normal.    Results for orders placed or performed during the hospital encounter of 05/16/20  Surgical pathology  Result Value Ref Range   SURGICAL PATHOLOGY      SURGICAL PATHOLOGY CASE: [id number] PATIENT: Lasonia Keithley Surgical Pathology Report     Specimen Submitted: A. Colon polyp, sigmoid; cbx  Clinical History: Personal history of colon polyps.  Sigmoid colon polyp      DIAGNOSIS: A. COLON POLYP, SIGMOID; COLD BIOPSY: - TUBULAR ADENOMA. - NEGATIVE FOR HIGH-GRADE DYSPLASIA AND MALIGNANCY.  GROSS DESCRIPTION: A. Labeled: Sigmoid colon polyp cbx Received: Formalin Collection time: 11:20 AM on 05/16/2020 Placed into formalin time: 11:20 AM on 05/16/2020 Tissue fragment(s): 3 Size: Aggregate, 0.5 x 0.4 x 0.1 cm Description: Tan soft tissue fragments Entirely submitted in 1  cassette.  RB 05/16/2020   Final Diagnosis performed by Quay Burow, MD.   Electronically signed 05/17/2020 9:50:15AM The electronic signature indicates that the named Attending Pathologist has evaluated the specimen Technical component performed at [facility], [address] [place], [place] [address] Lab: [phone] Dir: Rush Farmer,  MD, MMM  Professional component performed at [facility], [facility], [place], [place], [place] [address] Lab: [phone] Dir: Dellia Nims. Rubinas, MD       Assessment & Plan:   Problem List Items Addressed This Visit       Cardiovascular and Mediastinum   Benign hypertension    Chronic, stable with BP well below goal for age and HR tightly controlled.  Due to ongoing fatigue will work on medication minimization, stop Hydrochlorothiazide at this time and continue Amlodipine only at 5 MG.  Discussed at length with patient.  Recommend she monitor BP at least a few mornings a week at home and document for provider.  DASH diet at home.  Labs today: CBC, CMP, lipid.  Return in 4 weeks.       Relevant Orders   CBC with Differential/Platelet   Comprehensive metabolic panel     Digestive   GERD (gastroesophageal reflux disease)    Chronic, ongoing.  She tried stopping Protonix with return of symptoms in past.  Recommend she continue current regimen and adjust as needed.  Mag level today.      Relevant Orders   Magnesium     Other   Major depression - Primary    Chronic, ongoing.  Denies SI/HI.  Suspect some of fatigue may be related to mood at this time, recommend continue therapy through her church for spiritual guidance. Continue Sertraline 100 MG daily, refills as needed.  Follow-up in 6 months for mood, sooner if worsening.      Hypercholesterolemia    Chronic, ongoing.  Continue current medication regimen and adjust as needed. Lipid panel and CMP today.        Relevant Orders   Comprehensive metabolic panel   Lipid  Panel w/o Chol/HDL Ratio   Fatigue  Ongoing, no worsening, mainly in morning when wakes up.  Recommend she take Vitamin B12 consistently daily, as levels low normal.  Check labs today and minimize medications, as some of fatigue could be coming from tightly controlled BP and HR.   If ongoing discussed with her pursuing CT scan of lungs, due to past history of smoking + repeat colonoscopy.  Return in 4 weeks.      Elevated hemoglobin A1c    Last 5.7%.  Will recheck labs today and initiate medication as needed based on findings.      Relevant Orders   HgB A1c   B12 deficiency    Ongoing.  Continue daily supplement and recheck B12 level today.      Relevant Orders   Vitamin B12     Follow up plan: Return in about 4 weeks (around 11/13/2020) for HTN -- stopped hydrochlorothiazide last visit.

## 2020-10-16 NOTE — Assessment & Plan Note (Signed)
Chronic, ongoing.  She tried stopping Protonix with return of symptoms in past.  Recommend she continue current regimen and adjust as needed.  Mag level today.

## 2020-10-16 NOTE — Assessment & Plan Note (Signed)
Chronic, ongoing.  Denies SI/HI.  Suspect some of fatigue may be related to mood at this time, recommend continue therapy through her church for spiritual guidance. Continue Sertraline 100 MG daily, refills as needed.  Follow-up in 6 months for mood, sooner if worsening.

## 2020-10-16 NOTE — Assessment & Plan Note (Signed)
Last 5.7%.  Will recheck labs today and initiate medication as needed based on findings.

## 2020-10-16 NOTE — Patient Instructions (Signed)
Stop Hydrochlorothiazide and monitor blood pressure at home, with goal <130/80.  [email or web address]">  DASH Eating Plan DASH stands for Dietary Approaches to Stop Hypertension. The DASH eating plan is a healthy eating plan that has been shown to: Reduce high blood pressure (hypertension). Reduce your risk for type 2 diabetes, heart disease, and stroke. Help with weight loss. What are tips for following this plan? Reading food labels Check food labels for the amount of salt (sodium) per serving. Choose foods with less than 5 percent of the Daily Value of sodium. Generally, foods with less than 300 milligrams (mg) of sodium per serving fit into this eating plan. To find whole grains, look for the word "whole" as the first word in the ingredient list. Shopping Buy products labeled as "low-sodium" or "no salt added." Buy fresh foods. Avoid canned foods and pre-made or frozen meals. Cooking Avoid adding salt when cooking. Use salt-free seasonings or herbs instead of table salt or sea salt. Check with your health care provider or pharmacist before using salt substitutes. Do not fry foods. Cook foods using healthy methods such as baking, boiling, grilling, roasting, and broiling instead. Cook with heart-healthy oils, such as olive, canola, avocado, soybean, or sunflower oil. Meal planning  Eat a balanced diet that includes: 4 or more servings of fruits and 4 or more servings of vegetables each day. Try to fill one-half of your plate with fruits and vegetables. 6-8 servings of whole grains each day. Less than 6 oz (170 g) of lean meat, poultry, or fish each day. A 3-oz (85-g) serving of meat is about the same size as a deck of cards. One egg equals 1 oz (28 g). 2-3 servings of low-fat dairy each day. One serving is 1 cup (237 mL). 1 serving of nuts, seeds, or beans 5 times each week. 2-3 servings of heart-healthy fats. Healthy fats called omega-3 fatty  acids are found in foods such as walnuts, flaxseeds, fortified milks, and eggs. These fats are also found in cold-water fish, such as sardines, salmon, and mackerel. Limit how much you eat of: Canned or prepackaged foods. Food that is high in trans fat, such as some fried foods. Food that is high in saturated fat, such as fatty meat. Desserts and other sweets, sugary drinks, and other foods with added sugar. Full-fat dairy products. Do not salt foods before eating. Do not eat more than 4 egg yolks a week. Try to eat at least 2 vegetarian meals a week. Eat more home-cooked food and less restaurant, buffet, and fast food.  Lifestyle When eating at a restaurant, ask that your food be prepared with less salt or no salt, if possible. If you drink alcohol: Limit how much you use to: 0-1 drink a day for women who are not pregnant. 0-2 drinks a day for men. Be aware of how much alcohol is in your drink. In the [place] one drink equals one 12 oz bottle of beer (355 mL), one 5 oz glass of wine (148 mL), or one 1 oz glass of hard liquor (44 mL). General information Avoid eating more than 2,300 mg of salt a day. If you have hypertension, you may need to reduce your sodium intake to 1,500 mg a day. Work with your health care provider to maintain a healthy body weight or to lose weight. Ask what an ideal weight is for you. Get at least 30 minutes of exercise that causes your heart to beat faster (aerobic exercise) most days of the week. Activities  may include walking, swimming, or biking. Work with your health care provider or dietitian to adjust your eating plan to your individual calorie needs. What foods should I eat? Fruits All fresh, dried, or frozen fruit. Canned fruit in natural juice (without addedsugar). Vegetables Fresh or frozen vegetables (raw, steamed, roasted, or grilled). Low-sodium or reduced-sodium tomato and vegetable juice. Low-sodium or reduced-sodium tomatosauce and tomato paste.  Low-sodium or reduced-sodium canned vegetables. Grains Whole-grain or whole-wheat bread. Whole-grain or whole-wheat pasta. Brown rice. [place] Morrow. Bulgur. Whole-grain and low-sodium cereals. Pita bread.Low-fat, low-sodium crackers. Whole-wheat flour tortillas. Meats and other proteins Skinless chicken or [place]. Ground chicken or [place]. Pork with fat trimmed off. Fish and seafood. Egg whites. Dried beans, peas, or lentils. Unsalted nuts, nut butters, and seeds. Unsalted canned beans. Lean cuts of beef with fat trimmed off. Low-sodium, lean precooked or cured meat, such as sausages or meatloaves. Dairy Low-fat (1%) or fat-free (skim) milk. Reduced-fat, low-fat, or fat-free cheeses. Nonfat, low-sodium ricotta or cottage cheese. Low-fat or nonfatyogurt. Low-fat, low-sodium cheese. Fats and oils Soft margarine without trans fats. Vegetable oil. Reduced-fat, low-fat, or light mayonnaise and salad dressings (reduced-sodium). Canola, safflower, olive, avocado, soybean, andsunflower oils. Avocado. Seasonings and condiments Herbs. Spices. Seasoning mixes without salt. Other foods Unsalted popcorn and pretzels. Fat-free sweets. The items listed above may not be a complete list of foods and beverages you can eat. Contact a dietitian for more information. What foods should I avoid? Fruits Canned fruit in a light or heavy syrup. Fried fruit. Fruit in cream or buttersauce. Vegetables Creamed or fried vegetables. Vegetables in a cheese sauce. Regular canned vegetables (not low-sodium or reduced-sodium). Regular canned tomato sauce and paste (not low-sodium or reduced-sodium). Regular tomato and vegetable juice(not low-sodium or reduced-sodium). Angie Fava. Olives. Grains Baked goods made with fat, such as croissants, muffins, or some breads. Drypasta or rice meal packs. Meats and other proteins Fatty cuts of meat. Ribs. Fried meat. Berniece Salines. Bologna, salami, and other precooked or cured meats, such as  sausages or meat loaves. Fat from the back of a pig (fatback). Bratwurst. Salted nuts and seeds. Canned beans with added salt. Canned orsmoked fish. Whole eggs or egg yolks. Chicken or [place] with skin. Dairy Whole or 2% milk, cream, and half-and-half. Whole or full-fat cream cheese. Whole-fat or sweetened yogurt. Full-fat cheese. Nondairy creamers. Whippedtoppings. Processed cheese and cheese spreads. Fats and oils Butter. Stick margarine. Lard. Shortening. Ghee. Bacon fat. Tropical oils, suchas coconut, palm kernel, or palm oil. Seasonings and condiments Onion salt, garlic salt, seasoned salt, table salt, and sea salt. Worcestershire sauce. Tartar sauce. Barbecue sauce. Teriyaki sauce. Soy sauce, including reduced-sodium. Steak sauce. Canned and packaged gravies. Fish sauce. Oyster sauce. Cocktail sauce. Store-bought horseradish. Ketchup. Mustard. Meat flavorings and tenderizers. Bouillon cubes. Hot sauces. Pre-made or packaged marinades. Pre-made or packaged taco seasonings. Relishes. Regular saladdressings. Other foods Salted popcorn and pretzels. The items listed above may not be a complete list of foods and beverages you should avoid. Contact a dietitian for more information. Where to find more information National Heart, Lung, and Blood Institute: https://wilson-eaton.com/ American Heart Association: www.heart.org Academy of Nutrition and Dietetics: www.eatright.[place]: www.kidney.org Summary The DASH eating plan is a healthy eating plan that has been shown to reduce high blood pressure (hypertension). It may also reduce your risk for type 2 diabetes, heart disease, and stroke. When on the DASH eating plan, aim to eat more fresh fruits and vegetables, whole grains, lean proteins, low-fat dairy, and heart-healthy fats. With  the DASH eating plan, you should limit salt (sodium) intake to 2,300 mg a day. If you have hypertension, you may need to reduce your sodium intake to 1,500  mg a day. Work with your health care provider or dietitian to adjust your eating plan to your individual calorie needs. This information is not intended to replace advice given to you by your health care provider. Make sure you discuss any questions you have with your healthcare provider. Document Revised: 01/22/2019 Document Reviewed: 01/22/2019 Elsevier Patient Education  2022 [employer].

## 2020-10-16 NOTE — Assessment & Plan Note (Signed)
Ongoing, no worsening, mainly in morning when wakes up.  Recommend she take Vitamin B12 consistently daily, as levels low normal.  Check labs today and minimize medications, as some of fatigue could be coming from tightly controlled BP and HR.   If ongoing discussed with her pursuing CT scan of lungs, due to past history of smoking + repeat colonoscopy.  Return in 4 weeks.

## 2020-10-16 NOTE — Assessment & Plan Note (Signed)
Chronic, stable with BP well below goal for age and HR tightly controlled.  Due to ongoing fatigue will work on medication minimization, stop Hydrochlorothiazide at this time and continue Amlodipine only at 5 MG.  Discussed at length with patient.  Recommend she monitor BP at least a few mornings a week at home and document for provider.  DASH diet at home.  Labs today: CBC, CMP, lipid.  Return in 4 weeks.

## 2020-10-16 NOTE — Assessment & Plan Note (Signed)
Chronic, ongoing.  Continue current medication regimen and adjust as needed.  Lipid panel and CMP today.    

## 2020-10-16 NOTE — Assessment & Plan Note (Signed)
Ongoing.  Continue daily supplement and recheck B12 level today.

## 2020-10-17 ENCOUNTER — Other Ambulatory Visit: Payer: Self-pay | Admitting: Nurse Practitioner

## 2020-10-17 LAB — VITAMIN B12: Vitamin B-12: 373 pg/mL (ref 232–1245)

## 2020-10-17 LAB — CBC WITH DIFFERENTIAL/PLATELET
Basophils Absolute: 0 10*3/uL (ref 0.0–0.2)
Basos: 1 %
EOS (ABSOLUTE): 0.1 10*3/uL (ref 0.0–0.4)
Eos: 2 %
Hematocrit: 43.4 % (ref 34.0–46.6)
Hemoglobin: 14.1 g/dL (ref 11.1–15.9)
Immature Grans (Abs): 0 10*3/uL (ref 0.0–0.1)
Immature Granulocytes: 0 %
Lymphocytes Absolute: 1.5 10*3/uL (ref 0.7–3.1)
Lymphs: 26 %
MCH: 28.7 pg (ref 26.6–33.0)
MCHC: 32.5 g/dL (ref 31.5–35.7)
MCV: 88 fL (ref 79–97)
Monocytes Absolute: 0.4 10*3/uL (ref 0.1–0.9)
Monocytes: 7 %
Neutrophils Absolute: 3.8 10*3/uL (ref 1.4–7.0)
Neutrophils: 64 %
Platelets: 203 10*3/uL (ref 150–450)
RBC: 4.92 x10E6/uL (ref 3.77–5.28)
RDW: 14.5 % (ref 11.7–15.4)
WBC: 5.8 10*3/uL (ref 3.4–10.8)

## 2020-10-17 LAB — LIPID PANEL W/O CHOL/HDL RATIO
Cholesterol, Total: 187 mg/dL (ref 100–199)
HDL: 47 mg/dL (ref >39)
LDL Chol Calc (NIH): 121 mg/dL — ABNORMAL HIGH (ref 0–99)
Triglycerides: 105 mg/dL (ref 0–149)
VLDL Cholesterol Cal: 19 mg/dL (ref 5–40)

## 2020-10-17 LAB — COMPREHENSIVE METABOLIC PANEL
ALT: 15 IU/L (ref 0–32)
AST: 15 IU/L (ref 0–40)
Albumin/Globulin Ratio: 1.6 (ref 1.2–2.2)
Albumin: 4.3 g/dL (ref 3.7–4.7)
Alkaline Phosphatase: 86 IU/L (ref 44–121)
BUN/Creatinine Ratio: 17 (ref 12–28)
BUN: 13 mg/dL (ref 8–27)
Bilirubin Total: 0.4 mg/dL (ref 0.0–1.2)
CO2: 22 mmol/L (ref 20–29)
Calcium: 9.5 mg/dL (ref 8.7–10.3)
Chloride: 103 mmol/L (ref 96–106)
Creatinine, Ser: 0.77 mg/dL (ref 0.57–1.00)
Globulin, Total: 2.7 g/dL (ref 1.5–4.5)
Glucose: 104 mg/dL — ABNORMAL HIGH (ref 65–99)
Potassium: 4.4 mmol/L (ref 3.5–5.2)
Sodium: 141 mmol/L (ref 134–144)
Total Protein: 7 g/dL (ref 6.0–8.5)
eGFR: 82 mL/min/{1.73_m2} (ref >59)

## 2020-10-17 LAB — HEMOGLOBIN A1C
Est. average glucose Bld gHb Est-mCnc: 123 mg/dL
Hgb A1c MFr Bld: 5.9 % — ABNORMAL HIGH (ref 4.8–5.6)

## 2020-10-17 LAB — MAGNESIUM: Magnesium: 2.2 mg/dL (ref 1.6–2.3)

## 2020-10-17 MED ORDER — ATORVASTATIN CALCIUM 80 MG PO TABS: 80.0000 mg | ORAL_TABLET | Freq: Every day | ORAL | 3 refills | Status: DC

## 2020-10-17 NOTE — Progress Notes (Signed)
Good afternoon, please let Tabitha Larson know labs have returned.  Cholesterol levels remain above goal, would like to increase her Atorvastatin to 80 MG daily.  Is she okay with this change, if so I will send in new dosing.  Goal is to prevent stroke.  B12 level remains on low side normal, please ensure you are taking Vitamin B12 1000 MCG daily.  A1c remains in prediabetic range, but is coming down.  6.1% last visit and 5.9% today.  Continue diet focus.  Any questions? Keep being awesome!!  Thank you for allowing me to participate in your care.  I appreciate you. Kindest regards, Dedee Liss

## 2020-11-13 ENCOUNTER — Ambulatory Visit: Payer: Medicare PPO | Admitting: Nurse Practitioner

## 2020-12-06 ENCOUNTER — Ambulatory Visit: Payer: Medicare PPO | Admitting: Nurse Practitioner

## 2020-12-06 ENCOUNTER — Other Ambulatory Visit: Payer: Self-pay

## 2020-12-06 ENCOUNTER — Encounter: Payer: Self-pay | Admitting: Nurse Practitioner

## 2020-12-06 VITALS — BP 117/70 | HR 63 | Temp 98.6°F | Wt 133.4 lb

## 2020-12-06 DIAGNOSIS — R0989 Other specified symptoms and signs involving the circulatory and respiratory systems: Secondary | ICD-10-CM

## 2020-12-06 DIAGNOSIS — I10 Essential (primary) hypertension: Secondary | ICD-10-CM

## 2020-12-06 DIAGNOSIS — Z23 Encounter for immunization: Secondary | ICD-10-CM | POA: Diagnosis not present

## 2020-12-06 NOTE — Progress Notes (Signed)
Established Patient Office Visit  Subjective:  Patient ID: Tabitha Larson, female    DOB: Sep 08, 1950  Age: 70 y.o. MRN: [id number]  CC:  Chief Complaint  Patient presents with   Medication Management   Hypertension    Patient states yesterday she has not been checking her blood pressure like she should, but happened to check it yesterday and it was elevated. Patient denies having any indications of her blood pressure being elevated.     HPI Tabitha Larson presents for follow-up visit today.  HYPERTENSION / HYPERLIPIDEMIA Continues on Atorvastatin, at visit in August 2022 we stopped HCTZ due to ongoing fatigue reports and stable BP levels.  She reports she also has not been taking Amlodipine over past months.   Satisfied with current treatment? yes Duration of hypertension: chronic BP monitoring frequency: not checking BP range:  BP medication side effects: no Duration of hyperlipidemia: chronic Cholesterol medication side effects: no Cholesterol supplements: none Medication compliance: good compliance Aspirin: no Recent stressors: no Recurrent headaches: no Visual changes: no Palpitations: no Dyspnea: no Chest pain: no Lower extremity edema: no Dizzy/lightheaded: no  The 10-year ASCVD risk score (Arnett DK, et al., 2019) is: 9%   Values used to calculate the score:     Age: 45 years     Sex: Female     Is Non-Hispanic African American: No     Diabetic: No     Tobacco smoker: No     Systolic Blood Pressure: 824 mmHg     Is BP treated: No     HDL Cholesterol: 47 mg/dL     Total Cholesterol: 187 mg/dL    Past Medical History:  Diagnosis Date   Ankle fracture    Anxiety    Depression    GERD (gastroesophageal reflux disease)    Hyperlipidemia    Hypertension    Migraine    Osteoporosis    PONV (postoperative nausea and vomiting)    after ankle repair    Wears dentures    full upper and lower    Past Surgical History:  Procedure Laterality Date    ANKLE FRACTURE SURGERY     CATARACT EXTRACTION Right 01/2014   CATARACT EXTRACTION W/PHACO Left 03/26/2016   Procedure: CATARACT EXTRACTION PHACO AND INTRAOCULAR LENS PLACEMENT ([place])  left;  Surgeon: Eulogio Bear, MD;  Location: [facility];  Service: Ophthalmology;  Laterality: Left;   COLONOSCOPY  02/04/2014   COLONOSCOPY WITH PROPOFOL N/A 07/28/2015   Procedure: COLONOSCOPY WITH PROPOFOL;  Surgeon: Lucilla Lame, MD;  Location: [place];  Service: Endoscopy;  Laterality: N/A;  No anesthesia   COLONOSCOPY WITH PROPOFOL N/A 05/16/2020   Procedure: COLONOSCOPY WITH PROPOFOL;  Surgeon: Lucilla Lame, MD;  [facility];  Service: Endoscopy;  Laterality: N/A;   ESOPHAGOGASTRODUODENOSCOPY (EGD) WITH PROPOFOL N/A 12/17/2018   Procedure: ESOPHAGOGASTRODUODENOSCOPY (EGD) WITH Dilation;  Surgeon: Lucilla Lame, MD;  Location: [place];  Service: Endoscopy;  Laterality: N/A;  requests mid-morning arrival   MANDIBLE SURGERY     POLYPECTOMY  07/28/2015   Procedure: POLYPECTOMY;  Surgeon: Lucilla Lame, MD;  Location: [place];  Service: Endoscopy;;    Family History  Problem Relation Age of Onset   Stroke Mother    Prostate cancer Father    Colon cancer Sister    Hypertension Sister    Hyperlipidemia Sister    Breast cancer Neg Hx     Social History   Socioeconomic History   Marital status: Divorced  Spouse name: Not on file   Number of children: Not on file   Years of education: Not on file   Highest education level: High school graduate  Occupational History   Occupation: retired  Tobacco Use   Smoking status: Former    Types: Cigarettes    Quit date: 01/05/1984    Years since quitting: 36.9   Smokeless tobacco: Never  Vaping Use   Vaping Use: Never used  Substance and Sexual Activity   Alcohol use: No    Alcohol/week: 0.0 standard drinks   Drug use: No   Sexual activity: Not Currently    Birth control/protection:  Post-menopausal  Other Topics Concern   Not on file  Social History Narrative   Niece lives in area   No kids   Has friends close by   [employer] live with her currently    Social Determinants of Health   Financial Resource Strain: Low Risk    Difficulty of Paying Living Expenses: Not hard at all  Food Insecurity: No Food Insecurity   Worried About Charity fundraiser in the Last Year: Never true   Arboriculturist in the Last Year: Never true  Transportation Needs: No Transportation Needs   Lack of Transportation (Medical): No   Lack of Transportation (Non-Medical): No  Physical Activity: Insufficiently Active   Days of Exercise per Week: 3 days   Minutes of Exercise per Session: 40 min  Stress: No Stress Concern Present   Feeling of Stress : Only a little  Social Connections: Moderately Isolated   Frequency of Communication with Friends and Family: Three times a week   Frequency of Social Gatherings with Friends and Family: Three times a week   Attends Religious Services: 1 to 4 times per year   Active Member of Clubs or Organizations: No   Attends Archivist Meetings: Never   Marital Status: Widowed  Human resources officer Violence: Not At Risk   Fear of Current or Ex-Partner: No   Emotionally Abused: No   Physically Abused: No   Sexually Abused: No    Outpatient Medications Prior to Visit  Medication Sig Dispense Refill   atorvastatin (LIPITOR) 80 MG tablet Take 1 tablet (80 mg total) by mouth daily. 90 tablet 3   pantoprazole (PROTONIX) 20 MG tablet Take 1 tablet by mouth once daily 90 tablet 4   sertraline (ZOLOFT) 100 MG tablet Take 1 tablet by mouth once daily 90 tablet 0   vitamin B-12 (CYANOCOBALAMIN) 1000 MCG tablet Take 1,000 mcg by mouth daily.     amLODipine (NORVASC) 5 MG tablet Take 1 tablet by mouth once daily 90 tablet 0   No facility-administered medications prior to visit.    No Known Allergies  ROS Review of Systems  Constitutional:  Negative  for activity change, appetite change, diaphoresis, fatigue and fever.  Respiratory:  Negative for cough, chest tightness and shortness of breath.   Cardiovascular:  Negative for chest pain, palpitations and leg swelling.  Gastrointestinal: Negative.   Neurological: Negative.   Psychiatric/Behavioral:  Negative for decreased concentration, self-injury, sleep disturbance and suicidal ideas. The patient is not nervous/anxious.      Objective:    Physical Exam Vitals and nursing note reviewed.  Constitutional:      General: She is awake. She is not in acute distress.    Appearance: She is well-developed and well-groomed. She is not ill-appearing or toxic-appearing.  HENT:     Head: Normocephalic.  Right Ear: Hearing normal.     Left Ear: Hearing normal.  Eyes:     General: Lids are normal.        Right eye: No discharge.        Left eye: No discharge.     Conjunctiva/sclera: Conjunctivae normal.     Pupils: Pupils are equal, round, and reactive to light.  Neck:     Thyroid: No thyromegaly.     Vascular: Carotid bruit (right side) present.  Cardiovascular:     Rate and Rhythm: Normal rate and regular rhythm.     Heart sounds: Normal heart sounds. No murmur heard.   No gallop.  Pulmonary:     Effort: Pulmonary effort is normal. No accessory muscle usage or respiratory distress.     Breath sounds: Normal breath sounds.  Abdominal:     General: Bowel sounds are normal.     Palpations: Abdomen is soft.  Musculoskeletal:     Cervical back: Normal range of motion and neck supple.     Right lower leg: No edema.     Left lower leg: No edema.  Lymphadenopathy:     Cervical: No cervical adenopathy.  Skin:    General: Skin is warm and dry.  Neurological:     Mental Status: She is alert and oriented to person, place, and time.  Psychiatric:        Attention and Perception: Attention normal.        Mood and Affect: Mood normal.        Speech: Speech normal.        Behavior:  Behavior normal. Behavior is cooperative.        Thought Content: Thought content normal.   BP 117/70   Pulse 63   Temp 98.6 F (37 C) (Oral)   Wt 133 lb 6.4 oz (60.5 kg)   LMP  (LMP Unknown)   SpO2 97%   BMI 24.40 kg/m  Wt Readings from Last 3 Encounters:  12/06/20 133 lb 6.4 oz (60.5 kg)  10/16/20 138 lb 3.2 oz (62.7 kg)  08/21/20 141 lb 6.4 oz (64.1 kg)    Health Maintenance Due  Topic Date Due   INFLUENZA VACCINE  10/02/2020    There are no preventive care reminders to display for this patient.  Lab Results  Component Value Date   TSH 2.840 03/08/2020   Lab Results  Component Value Date   WBC 5.8 10/16/2020   HGB 14.1 10/16/2020   HCT 43.4 10/16/2020   MCV 88 10/16/2020   PLT 203 10/16/2020   Lab Results  Component Value Date   NA 141 10/16/2020   K 4.4 10/16/2020   CO2 22 10/16/2020   GLUCOSE 104 (H) 10/16/2020   BUN 13 10/16/2020   CREATININE 0.77 10/16/2020   BILITOT 0.4 10/16/2020   ALKPHOS 86 10/16/2020   AST 15 10/16/2020   ALT 15 10/16/2020   PROT 7.0 10/16/2020   ALBUMIN 4.3 10/16/2020   CALCIUM 9.5 10/16/2020   EGFR 82 10/16/2020   Lab Results  Component Value Date   CHOL 187 10/16/2020   Lab Results  Component Value Date   HDL 47 10/16/2020   Lab Results  Component Value Date   LDLCALC 121 (H) 10/16/2020   Lab Results  Component Value Date   TRIG 105 10/16/2020   Lab Results  Component Value Date   CHOLHDL 4.0 01/08/2018   Lab Results  Component Value Date   HGBA1C 5.9 (H) 10/16/2020  Assessment & Plan:   Problem List Items Addressed This Visit       Cardiovascular and Mediastinum   Benign hypertension - Primary    Chronic, stable with BP well below goal for age without medications.  She has lost almost 10 pounds since June, will continue to monitor to ensure not too much loss presents.  Continue without medications at this time.  Discussed at length with patient.  Recommend she monitor BP at least a few mornings  a week at home and document for provider.  DASH diet at home.  Labs today: none.  Return in 5 months.         Other   Right carotid bruit    Imaging last = 03/22/20 imaging = Moderate amount of bilateral atherosclerotic plaque, not resulting in a hemodynamically significant stenosis.  No current symptoms, will monitor and may repeat imaging in 1-2 years to ensure no worsening plaque.      Other Visit Diagnoses     Flu vaccine need       Flu vaccine today.   Relevant Orders   Flu Vaccine QUAD High Dose(Fluad)       Follow-up: Return in about 5 months (around 05/06/2021) for HTN/HLD, MOOD, OSTEOPENIA.    Venita Lick, NP

## 2020-12-06 NOTE — Assessment & Plan Note (Signed)
Chronic, stable with BP well below goal for age without medications.  She has lost almost 10 pounds since June, will continue to monitor to ensure not too much loss presents.  Continue without medications at this time.  Discussed at length with patient.  Recommend she monitor BP at least a few mornings a week at home and document for provider.  DASH diet at home.  Labs today: none.  Return in 5 months.

## 2020-12-06 NOTE — Patient Instructions (Signed)

## 2020-12-06 NOTE — Assessment & Plan Note (Signed)
Imaging last = 03/22/20 imaging = Moderate amount of bilateral atherosclerotic plaque, not resulting in a hemodynamically significant stenosis.  No current symptoms, will monitor and may repeat imaging in 1-2 years to ensure no worsening plaque.

## 2020-12-23 ENCOUNTER — Other Ambulatory Visit: Payer: Self-pay | Admitting: Nurse Practitioner

## 2020-12-23 NOTE — Telephone Encounter (Signed)
Requested Prescriptions  Pending Prescriptions Disp Refills  . sertraline (ZOLOFT) 100 MG tablet [Pharmacy Med Name: Sertraline HCl 100 MG Oral Tablet] 90 tablet 0    Sig: Take 1 tablet by mouth once daily     Psychiatry:  Antidepressants - SSRI Passed - 12/23/2020  7:13 AM      Passed - Completed PHQ-2 or PHQ-9 in the last 360 days      Passed - Valid encounter within last 6 months    Recent Outpatient Visits          2 weeks ago Benign hypertension   [place], Jolene T, NP   2 months ago Severe episode of recurrent major depressive disorder, without psychotic features ([place])   [place], Jolene T, NP   4 months ago Breast pain, left   [facility] [place], Lauren A, NP   8 months ago Chronic migraine without aura without status migrainosus, not intractable   [place], Jolene T, NP   9 months ago Severe episode of recurrent major depressive disorder, without psychotic features ([place])   [place], Barbaraann Faster, NP      Future Appointments            In 4 months [place], Barbaraann Faster, NP [facility]   In 6 months  [facility]

## 2021-03-01 NOTE — Progress Notes (Signed)
Acute Office Visit  Subjective:    Patient ID: Tabitha Larson, female    DOB: 1950/07/04, 70 y.o.   MRN: [id number]  Chief Complaint  Patient presents with   Dizziness    Pt states she has been experiencing dizziness for the last 3 days. States on Wednesday she was dizzy about all day. Yesterday and today, she states that the dizziness is coming and going.     HPI Patient is in today for dizziness for the past 3 days. She felt like on Wednesday, she was going to pass out. It comes and goes for the past 2 days. She states that she was recently taken off her blood pressure medicine for low blood pressure.   DIZZINESS  Duration: days Description of symptoms: lightheaded Duration of episode: minutes Dizziness frequency: no history of the same Provoking factors: none Aggravating factors:  none Triggered by rolling over in bed: no Triggered by bending over: yes Aggravated by head movement: no Aggravated by exertion, coughing, loud noises:  sometimes Recent head injury: no Recent or current viral symptoms: no History of vasovagal episodes: no Nausea:  intermittent Vomiting: no Tinnitus: no Hearing loss: no Aural fullness: no Headache:  intermittent Photophobia/phonophobia: no Unsteady gait: yes Postural instability: no Diplopia, dysarthria, dysphagia or weakness: no Related to exertion: no Pallor: no Diaphoresis: yes Dyspnea: no Chest pain: no   Past Medical History:  Diagnosis Date   Ankle fracture    Anxiety    Depression    GERD (gastroesophageal reflux disease)    Hyperlipidemia    Hypertension    Migraine    Osteoporosis    PONV (postoperative nausea and vomiting)    after ankle repair    Wears dentures    full upper and lower    Past Surgical History:  Procedure Laterality Date   ANKLE FRACTURE SURGERY     CATARACT EXTRACTION Right 01/2014   CATARACT EXTRACTION W/PHACO Left 03/26/2016   Procedure: CATARACT EXTRACTION PHACO AND INTRAOCULAR LENS  PLACEMENT ([place])  left;  Surgeon: Eulogio Bear, MD;  Location: [place];  Service: Ophthalmology;  Laterality: Left;   COLONOSCOPY  02/04/2014   COLONOSCOPY WITH PROPOFOL N/A 07/28/2015   Procedure: COLONOSCOPY WITH PROPOFOL;  Surgeon: Lucilla Lame, MD;  Location: [place];  Service: Endoscopy;  Laterality: N/A;  No anesthesia   COLONOSCOPY WITH PROPOFOL N/A 05/16/2020   Procedure: COLONOSCOPY WITH PROPOFOL;  Surgeon: Lucilla Lame, MD;  Location: [facility];  Service: Endoscopy;  Laterality: N/A;   ESOPHAGOGASTRODUODENOSCOPY (EGD) WITH PROPOFOL N/A 12/17/2018   Procedure: ESOPHAGOGASTRODUODENOSCOPY (EGD) WITH Dilation;  Surgeon: Lucilla Lame, MD;  Location: [place];  Service: Endoscopy;  Laterality: N/A;  requests mid-morning arrival   MANDIBLE SURGERY     POLYPECTOMY  07/28/2015   Procedure: POLYPECTOMY;  Surgeon: Lucilla Lame, MD;  Location: [place];  Service: Endoscopy;;    Family History  Problem Relation Age of Onset   Stroke Mother    Prostate cancer Father    Colon cancer Sister    Hypertension Sister    Hyperlipidemia Sister    Breast cancer Neg Hx     Social History   Socioeconomic History   Marital status: Divorced    Spouse name: Not on file   Number of children: Not on file   Years of education: Not on file   Highest education level: High school graduate  Occupational History   Occupation: retired  Tobacco Use   Smoking status: Former  Types: Cigarettes    Quit date: 01/05/1984    Years since quitting: 37.1   Smokeless tobacco: Never  Vaping Use   Vaping Use: Never used  Substance and Sexual Activity   Alcohol use: No    Alcohol/week: 0.0 standard drinks   Drug use: No   Sexual activity: Not Currently    Birth control/protection: Post-menopausal  Other Topics Concern   Not on file  Social History Narrative   Niece lives in area   No kids   Has friends close by   [employer] live with her currently     Social Determinants of Health   Financial Resource Strain: Low Risk    Difficulty of Paying Living Expenses: Not hard at all  Food Insecurity: No Food Insecurity   Worried About Charity fundraiser in the Last Year: Never true   Arboriculturist in the Last Year: Never true  Transportation Needs: No Transportation Needs   Lack of Transportation (Medical): No   Lack of Transportation (Non-Medical): No  Physical Activity: Insufficiently Active   Days of Exercise per Week: 3 days   Minutes of Exercise per Session: 40 min  Stress: No Stress Concern Present   Feeling of Stress : Only a little  Social Connections: Moderately Isolated   Frequency of Communication with Friends and Family: Three times a week   Frequency of Social Gatherings with Friends and Family: Three times a week   Attends Religious Services: 1 to 4 times per year   Active Member of Clubs or Organizations: No   Attends Archivist Meetings: Never   Marital Status: Widowed  Human resources officer Violence: Not At Risk   Fear of Current or Ex-Partner: No   Emotionally Abused: No   Physically Abused: No   Sexually Abused: No    Outpatient Medications Prior to Visit  Medication Sig Dispense Refill   atorvastatin (LIPITOR) 80 MG tablet Take 1 tablet (80 mg total) by mouth daily. 90 tablet 3   pantoprazole (PROTONIX) 20 MG tablet Take 1 tablet by mouth once daily 90 tablet 4   sertraline (ZOLOFT) 100 MG tablet Take 1 tablet by mouth once daily 90 tablet 1   vitamin B-12 (CYANOCOBALAMIN) 1000 MCG tablet Take 1,000 mcg by mouth daily.     No facility-administered medications prior to visit.    No Known Allergies  Review of Systems  Constitutional:  Positive for fatigue.  HENT: Negative.    Respiratory: Negative.    Cardiovascular: Negative.   Gastrointestinal:  Positive for constipation. Negative for abdominal pain, diarrhea, nausea and vomiting.  Genitourinary: Negative.   Skin: Negative.   Neurological:   Positive for dizziness and headaches (intermittent).      Objective:    Physical Exam Vitals and nursing note reviewed.  Constitutional:      General: She is not in acute distress.    Appearance: Normal appearance.  HENT:     Head: Normocephalic.     Right Ear: Tympanic membrane, ear canal and external ear normal.     Left Ear: Tympanic membrane, ear canal and external ear normal.  Eyes:     Extraocular Movements: Extraocular movements intact.     Conjunctiva/sclera: Conjunctivae normal.     Pupils: Pupils are equal, round, and reactive to light.  Cardiovascular:     Rate and Rhythm: Normal rate and regular rhythm.     Pulses: Normal pulses.     Heart sounds: Normal heart sounds.  Pulmonary:  Effort: Pulmonary effort is normal.     Breath sounds: Normal breath sounds.  Abdominal:     Palpations: Abdomen is soft.     Tenderness: There is no abdominal tenderness.  Musculoskeletal:     Cervical back: Normal range of motion.  Skin:    General: Skin is warm.  Neurological:     General: No focal deficit present.     Mental Status: She is alert and oriented to person, place, and time.     Motor: No weakness.     Gait: Gait normal.     Comments: Positive dix-hallpike maneuver on right side  Psychiatric:        Mood and Affect: Mood normal.        Behavior: Behavior normal.        Thought Content: Thought content normal.        Judgment: Judgment normal.    BP (!) 150/84    Pulse 67    Temp 98.1 F (36.7 C) (Oral)    Wt 137 lb (62.1 kg)    LMP  (LMP Unknown)    SpO2 98%    BMI 25.06 kg/m  Wt Readings from Last 3 Encounters:  03/02/21 137 lb (62.1 kg)  12/06/20 133 lb 6.4 oz (60.5 kg)  10/16/20 138 lb 3.2 oz (62.7 kg)    Health Maintenance Due  Topic Date Due   Zoster Vaccines- Shingrix (1 of 2) Never done   COVID-19 Vaccine (4 - Booster for Pfizer series) 02/25/2020    There are no preventive care reminders to display for this patient.   Lab Results   Component Value Date   TSH 2.840 03/08/2020   Lab Results  Component Value Date   WBC 5.8 10/16/2020   HGB 14.1 10/16/2020   HCT 43.4 10/16/2020   MCV 88 10/16/2020   PLT 203 10/16/2020   Lab Results  Component Value Date   NA 141 10/16/2020   K 4.4 10/16/2020   CO2 22 10/16/2020   GLUCOSE 104 (H) 10/16/2020   BUN 13 10/16/2020   CREATININE 0.77 10/16/2020   BILITOT 0.4 10/16/2020   ALKPHOS 86 10/16/2020   AST 15 10/16/2020   ALT 15 10/16/2020   PROT 7.0 10/16/2020   ALBUMIN 4.3 10/16/2020   CALCIUM 9.5 10/16/2020   EGFR 82 10/16/2020   Lab Results  Component Value Date   CHOL 187 10/16/2020   Lab Results  Component Value Date   HDL 47 10/16/2020   Lab Results  Component Value Date   LDLCALC 121 (H) 10/16/2020   Lab Results  Component Value Date   TRIG 105 10/16/2020   Lab Results  Component Value Date   CHOLHDL 4.0 01/08/2018   Lab Results  Component Value Date   HGBA1C 5.9 (H) 10/16/2020       Assessment & Plan:   Problem List Items Addressed This Visit       Cardiovascular and Mediastinum   Benign hypertension    Chronic, blood pressure elevated today at 150/84. Recommend that she continue checking her blood pressure daily. Decrease the amount of sodium she is eating in her diet. If blood pressure consistently >150/90, call the office.       Other Visit Diagnoses     Dizziness    -  Primary   Positive dix-hallpike maneuver, treat for BPPV. No red flags on exam. Check CMP, CBC, A1C, TSH. Keep next scheduled appointment with PCP   Relevant Orders   Comprehensive metabolic panel  CBC with Differential/Platelet   Bayer DCA Hb A1c Waived   TSH   Benign paroxysmal positional vertigo of right ear       Epley maneuvers described and printed for patient. Encouraged fluids, f/u if not improving   Constipation, unspecified constipation type       Increase fluids and fiber intake. can take daily fiber supplement prn. Also can use miralax prn. F/U  if not improving.        No orders of the defined types were placed in this encounter.    Charyl Dancer, NP

## 2021-03-02 ENCOUNTER — Other Ambulatory Visit: Payer: Self-pay

## 2021-03-02 ENCOUNTER — Encounter: Payer: Self-pay | Admitting: Nurse Practitioner

## 2021-03-02 ENCOUNTER — Ambulatory Visit: Payer: Medicare PPO | Admitting: Nurse Practitioner

## 2021-03-02 VITALS — BP 150/84 | HR 67 | Temp 98.1°F | Wt 137.0 lb

## 2021-03-02 DIAGNOSIS — R42 Dizziness and giddiness: Secondary | ICD-10-CM | POA: Diagnosis not present

## 2021-03-02 DIAGNOSIS — K5900 Constipation, unspecified: Secondary | ICD-10-CM

## 2021-03-02 DIAGNOSIS — H8111 Benign paroxysmal vertigo, right ear: Secondary | ICD-10-CM | POA: Diagnosis not present

## 2021-03-02 DIAGNOSIS — I10 Essential (primary) hypertension: Secondary | ICD-10-CM | POA: Diagnosis not present

## 2021-03-02 LAB — BAYER DCA HB A1C WAIVED: HB A1C (BAYER DCA - WAIVED): 5.7 % — ABNORMAL HIGH (ref 4.8–5.6)

## 2021-03-02 NOTE — Assessment & Plan Note (Signed)
Chronic, blood pressure elevated today at 150/84. Recommend that she continue checking her blood pressure daily. Decrease the amount of sodium she is eating in her diet. If blood pressure consistently >150/90, call the office.

## 2021-03-02 NOTE — Patient Instructions (Addendum)
Increase your fluids Increase your fiber - can take a supplement like benfiber or metamucil Can take miralax as needed for constipation

## 2021-03-03 LAB — CBC WITH DIFFERENTIAL/PLATELET
Basophils Absolute: 0.1 10*3/uL (ref 0.0–0.2)
Basos: 1 %
EOS (ABSOLUTE): 0.1 10*3/uL (ref 0.0–0.4)
Eos: 1 %
Hematocrit: 45.2 % (ref 34.0–46.6)
Hemoglobin: 15.2 g/dL (ref 11.1–15.9)
Immature Grans (Abs): 0 10*3/uL (ref 0.0–0.1)
Immature Granulocytes: 0 %
Lymphocytes Absolute: 1.7 10*3/uL (ref 0.7–3.1)
Lymphs: 28 %
MCH: 29.2 pg (ref 26.6–33.0)
MCHC: 33.6 g/dL (ref 31.5–35.7)
MCV: 87 fL (ref 79–97)
Monocytes Absolute: 0.4 10*3/uL (ref 0.1–0.9)
Monocytes: 7 %
Neutrophils Absolute: 3.9 10*3/uL (ref 1.4–7.0)
Neutrophils: 63 %
Platelets: 180 10*3/uL (ref 150–450)
RBC: 5.2 x10E6/uL (ref 3.77–5.28)
RDW: 14.6 % (ref 11.7–15.4)
WBC: 6.2 10*3/uL (ref 3.4–10.8)

## 2021-03-03 LAB — COMPREHENSIVE METABOLIC PANEL
ALT: 30 IU/L (ref 0–32)
AST: 24 IU/L (ref 0–40)
Albumin/Globulin Ratio: 1.7 (ref 1.2–2.2)
Albumin: 4.5 g/dL (ref 3.7–4.7)
Alkaline Phosphatase: 95 IU/L (ref 44–121)
BUN/Creatinine Ratio: 16 (ref 12–28)
BUN: 13 mg/dL (ref 8–27)
Bilirubin Total: 0.4 mg/dL (ref 0.0–1.2)
CO2: 23 mmol/L (ref 20–29)
Calcium: 9.6 mg/dL (ref 8.7–10.3)
Chloride: 100 mmol/L (ref 96–106)
Creatinine, Ser: 0.82 mg/dL (ref 0.57–1.00)
Globulin, Total: 2.7 g/dL (ref 1.5–4.5)
Glucose: 94 mg/dL (ref 70–99)
Potassium: 4.7 mmol/L (ref 3.5–5.2)
Sodium: 138 mmol/L (ref 134–144)
Total Protein: 7.2 g/dL (ref 6.0–8.5)
eGFR: 76 mL/min/{1.73_m2} (ref >59)

## 2021-03-03 LAB — TSH: TSH: 3 u[IU]/mL (ref 0.450–4.500)

## 2021-05-06 NOTE — Patient Instructions (Signed)
Preventing High Cholesterol Cholesterol is a white, waxy substance similar to fat that the human body needs to help build cells. The liver makes all the cholesterol that a person's body needs. Having high cholesterol (hypercholesterolemia) increases your risk for heart disease and stroke. Extra or excess cholesterol comes from the food that you eat. High cholesterol can often be prevented with diet and lifestyle changes. If you already have high cholesterol, you can control it with diet, lifestyle changes, and medicines. How can high cholesterol affect me? If you have high cholesterol, fatty deposits (plaques) may build up on the walls of your blood vessels. The blood vessels that carry blood away from your heart are called arteries. Plaques make the arteries narrower and stiffer. This in turn can: Restrict or block blood flow and cause blood clots to form. Increase your risk for heart attack and stroke. What can increase my risk for high cholesterol? This condition is more likely to develop in people who: Eat foods that are high in saturated fat or cholesterol. Saturated fat is mostly found in foods that come from animal sources. Are overweight. Are not getting enough exercise. Use products that contain nicotine or tobacco, such as cigarettes, e-cigarettes, and chewing tobacco. Have a family history of high cholesterol (familial hypercholesterolemia). What actions can I take to prevent this? Nutrition  Eat less saturated fat. Avoid trans fats (partially hydrogenated oils). These are often found in margarine and in some baked goods, fried foods, and snacks bought in packages. Avoid precooked or cured meat, such as bacon, sausages, or meat loaves. Avoid foods and drinks that have added sugars. Eat more fruits, vegetables, and whole grains. Choose healthy sources of protein, such as fish, poultry, lean cuts of red meat, beans, peas, lentils, and nuts. Choose healthy sources of fat, such  as: Nuts. Vegetable oils, especially olive oil. Fish that have healthy fats, such as omega-3 fatty acids. These fish include mackerel or salmon. Lifestyle Lose weight if you are overweight. Maintaining a healthy body mass index (BMI) can help prevent or control high cholesterol. It can also lower your risk for diabetes and high blood pressure. Ask your health care provider to help you with a diet and exercise plan to lose weight safely. Do not use any products that contain nicotine or tobacco. These products include cigarettes, chewing tobacco, and vaping devices, such as e-cigarettes. If you need help quitting, ask your health care provider. Alcohol use Do not drink alcohol if: Your health care provider tells you not to drink. You are pregnant, may be pregnant, or are planning to become pregnant. If you drink alcohol: Limit how much you have to: 0-1 drink a day for women. 0-2 drinks a day for men. Know how much alcohol is in your drink. In the [place] one drink equals one 12 oz bottle of beer (355 mL), one 5 oz glass of wine (148 mL), or one 1 oz glass of hard liquor (44 mL). Activity  Get enough exercise. Do exercises as told by your health care provider. Each week, do at least 150 minutes of exercise that takes a medium level of effort (moderate-intensity exercise). This kind of exercise: Makes your heart beat faster while allowing you to still be able to talk. Can be done in short sessions several times a day or longer sessions a few times a week. For example, on 5 days each week, you could walk fast or ride your bike 3 times a day for 10 minutes each time. Medicines Your   health care provider may recommend medicines to help lower cholesterol. This may be a medicine to lower the amount of cholesterol that your liver makes. You may need medicine if: ?Diet and lifestyle changes have not lowered your cholesterol enough. ?You have high cholesterol and other risk factors for heart disease or  stroke. ?Take over-the-counter and prescription medicines only as told by your health care provider. ?General information ?Manage your risk factors for high cholesterol. Talk with your health care provider about all your risk factors and how to lower your risk. ?Manage other conditions that you have, such as diabetes or high blood pressure (hypertension). ?Have blood tests to check your cholesterol levels at regular points in time as told by your health care provider. ?Keep all follow-up visits. This is important. ?Where to find more information ?American Heart Association: www.heart.org ?National Heart, Lung, and Blood Institute: https://wilson-eaton.com/ ?Summary ?High cholesterol increases your risk for heart disease and stroke. By keeping your cholesterol level low, you can reduce your risk for these conditions. ?High cholesterol can often be prevented with diet and lifestyle changes. ?Work with your health care provider to manage your risk factors, and have your blood tested regularly. ?This information is not intended to replace advice given to you by your health care provider. Make sure you discuss any questions you have with your health care provider. ?Document Revised: 04/24/2020 Document Reviewed: 04/24/2020 ?Elsevier Patient Education ? [place]. ? ?

## 2021-05-07 ENCOUNTER — Ambulatory Visit: Payer: Medicare PPO | Admitting: Nurse Practitioner

## 2021-05-07 ENCOUNTER — Other Ambulatory Visit: Payer: Self-pay

## 2021-05-07 ENCOUNTER — Encounter: Payer: Self-pay | Admitting: Nurse Practitioner

## 2021-05-07 VITALS — BP 134/79 | HR 60 | Temp 98.1°F | Ht 62.0 in | Wt 139.4 lb

## 2021-05-07 DIAGNOSIS — K219 Gastro-esophageal reflux disease without esophagitis: Secondary | ICD-10-CM | POA: Diagnosis not present

## 2021-05-07 DIAGNOSIS — G629 Polyneuropathy, unspecified: Secondary | ICD-10-CM

## 2021-05-07 DIAGNOSIS — I10 Essential (primary) hypertension: Secondary | ICD-10-CM

## 2021-05-07 DIAGNOSIS — M85851 Other specified disorders of bone density and structure, right thigh: Secondary | ICD-10-CM

## 2021-05-07 DIAGNOSIS — E7800 Pure hypercholesterolemia, unspecified: Secondary | ICD-10-CM

## 2021-05-07 DIAGNOSIS — E538 Deficiency of other specified B group vitamins: Secondary | ICD-10-CM

## 2021-05-07 DIAGNOSIS — F332 Major depressive disorder, recurrent severe without psychotic features: Secondary | ICD-10-CM

## 2021-05-07 DIAGNOSIS — R7309 Other abnormal glucose: Secondary | ICD-10-CM | POA: Diagnosis not present

## 2021-05-07 DIAGNOSIS — R0989 Other specified symptoms and signs involving the circulatory and respiratory systems: Secondary | ICD-10-CM

## 2021-05-07 DIAGNOSIS — G250 Essential tremor: Secondary | ICD-10-CM

## 2021-05-07 LAB — MICROALBUMIN, URINE WAIVED
Creatinine, Urine Waived: 100 mg/dL (ref 10–300)
Microalb, Ur Waived: 30 mg/L — ABNORMAL HIGH (ref 0–19)
Microalb/Creat Ratio: <30 mg/g (ref <30)

## 2021-05-07 LAB — BAYER DCA HB A1C WAIVED: HB A1C (BAYER DCA - WAIVED): 5.6 % (ref 4.8–5.6)

## 2021-05-07 MED ORDER — DULOXETINE HCL 30 MG PO CPEP: ORAL_CAPSULE | ORAL | 4 refills | Status: DC

## 2021-05-07 NOTE — Assessment & Plan Note (Signed)
Ongoing.  Continue daily supplement and recheck B12 level today. ?

## 2021-05-07 NOTE — Assessment & Plan Note (Signed)
Ongoing issue with fatigue and weakness ongoing. Has had MRI, carotid u/s, and multiple labs to rule our tick borne disease and other disease processes, all have been reassuring.  ?fibromyalgia vs other neurologic condition presenting.  Will obtain BMP, B12, A1c today.  Trial Duloxetine, stop Sertraline.  Referral to neurology placed. ?

## 2021-05-07 NOTE — Assessment & Plan Note (Signed)
Chronic, ongoing.  She tried stopping Protonix with return of symptoms in past.  Recommend she continue current regimen and adjust as needed.  Mag level annually. ?

## 2021-05-07 NOTE — Assessment & Plan Note (Signed)
Ongoing since childhood, familial, sister has as well.  Tried Propranolol in past with side effects presenting and stopped.  Referral to neurology has been placed. ?

## 2021-05-07 NOTE — Progress Notes (Signed)
BP 134/79    Pulse 60    Temp 98.1 F (36.7 C) (Oral)    Ht '5\' 2"'$  (1.575 m)    Wt 139 lb 6.4 oz (63.2 kg)    LMP  (LMP Unknown)    SpO2 97%    BMI 25.50 kg/m    Subjective:    Patient ID: Tabitha Larson, female    DOB: 09/17/1950, 71 y.o.   MRN: [id number]  HPI: Tabitha Larson is a 71 y.o. female  Chief Complaint  Patient presents with   Hyperlipidemia   Hypertension   Mood   Osteopenia   Pain    Patient states she is still experiencing generalized pain all over and states there is no particular place. Patient states she has some issues when getting up in the mornings.    Numbness    Patient states she is having numbness in her hands and feet. Patient states the numbness comes and goes.    HYPERTENSION / HYPERLIPIDEMIA Continues Atorvastatin only, HTN medications discontinued over past several months due to dizziness. Satisfied with current treatment? yes Duration of hypertension: chronic BP monitoring frequency: not checking BP range:  BP medication side effects: no Duration of hyperlipidemia: chronic Cholesterol medication side effects: no Cholesterol supplements: none Medication compliance: good compliance Aspirin: no Recent stressors: no Recurrent headaches: no Visual changes: no Palpitations: no Dyspnea: no Chest pain: no Lower extremity edema: no Dizzy/lightheaded: no  The 10-year ASCVD risk score (Arnett DK, et al., 2019) is: 12.8%   Values used to calculate the score:     Age: 28 years     Sex: Female     Is Non-Hispanic African American: No     Diabetic: No     Tobacco smoker: No     Systolic Blood Pressure: 419 mmHg     Is BP treated: No     HDL Cholesterol: 47 mg/dL     Total Cholesterol: 187 mg/dL  NEUROPATHY Having ongoing issues with all over pain, occasional dizziness intermittent, and some numbness. Last MRI in January 2022 with no acute events noted.  Carotid u/s 03/22/20 with no stenosis, but plaque present bilaterally.  Has had full work  up with negative tick borne diease work-up.  A1c last visit was 5.7%, she does endorse craving sugar.  No recent falls or fractures, but does endorse leg weakness at baseline.  Overall feeling fatigued. Ongoing issue waxing and waning  > 1 year. Has had sleep apnea test about 4-5 years ago which she reports was normal. Neuropathy status: uncontrolled  Satisfied with current treatment?: yes Medication side effects: no Location: feet and hands get very cold and numb Pain: yes Severity: 8/10  Quality:  tingling and pins and needles Frequency: intermittent Bilateral: yes Symmetric: yes Numbness: yes Decreased sensation: yes Weakness: yes Context: fluctuating Alleviating factors: nothing Aggravating factors: nothing Treatments attempted: nothing  GERD Continues Protonix on daily basis. GERD control status: stable  Satisfied with current treatment? yes Heartburn frequency: every day without medicine, with medicine hardly ever had Medication side effects: no  Medication compliance: stable Previous GERD medications: TUMS Antacid use frequency: every day Dysphagia: no Odynophagia:  no Hematemesis: no Blood in stool: no EGD: yes, October 2020  OSTEOPENIA Last DEXA = T-score of -1.9 in 2019 -- next 2024. Satisfied with current treatment?: yes Adequate calcium & vitamin D: yes Weight bearing exercises: yes   DEPRESSION Continues on Sertraline.  Denies SI/HI.   Mood status: stable Satisfied with current treatment?:  yes Symptom severity: mild  Duration of current treatment : chronic Side effects: no Medication compliance: good compliance Psychotherapy/counseling: none Depressed mood:  occasional Anxious mood: no Anhedonia: no Significant weight loss or gain: no Insomnia: none Fatigue: occasional Feelings of worthlessness or guilt: no Impaired concentration/indecisiveness: no Suicidal ideations: no Hopelessness: no Crying spells: no Depression screen [facility] 2/9 05/07/2021  03/02/2021 10/16/2020 06/23/2020 04/17/2020  Decreased Interest 0 1 0 0 0  Down, Depressed, Hopeless '2 1 1 3 '$ 0  PHQ - 2 Score '2 2 1 3 '$ 0  Altered sleeping 0 0 3 0 0  Tired, decreased energy '3 1 3 1 '$ 0  Change in appetite '3 3 3 3 '$ 0  Feeling bad or failure about yourself  '1 1 1 '$ 0 0  Trouble concentrating '1 1 1 '$ 0 -  Moving slowly or fidgety/restless 0 0 0 0 0  Suicidal thoughts 0 0 0 0 0  PHQ-9 Score '10 8 12 7 '$ 0  Difficult doing work/chores Somewhat difficult Somewhat difficult Not difficult at all Somewhat difficult -  Some recent data might be hidden    Relevant past medical, surgical, family and social history reviewed and updated as indicated. Interim medical history since our last visit reviewed. Allergies and medications reviewed and updated.  Review of Systems  Constitutional:  Positive for fatigue. Negative for activity change, appetite change, diaphoresis and fever.  Respiratory:  Negative for cough, chest tightness and shortness of breath.   Cardiovascular:  Negative for chest pain, palpitations and leg swelling.  Gastrointestinal: Negative.   Endocrine: Negative for cold intolerance and heat intolerance.  Neurological: Negative.   Psychiatric/Behavioral:  Negative for decreased concentration, self-injury, sleep disturbance and suicidal ideas. The patient is not nervous/anxious.    Per HPI unless specifically indicated above     Objective:    BP 134/79    Pulse 60    Temp 98.1 F (36.7 C) (Oral)    Ht '5\' 2"'$  (1.575 m)    Wt 139 lb 6.4 oz (63.2 kg)    LMP  (LMP Unknown)    SpO2 97%    BMI 25.50 kg/m   Wt Readings from Last 3 Encounters:  05/07/21 139 lb 6.4 oz (63.2 kg)  03/02/21 137 lb (62.1 kg)  12/06/20 133 lb 6.4 oz (60.5 kg)    Physical Exam Vitals and nursing note reviewed.  Constitutional:      General: She is awake. She is not in acute distress.    Appearance: She is well-developed and well-groomed. She is not ill-appearing or toxic-appearing.  HENT:     Head:  Normocephalic.     Right Ear: Hearing normal.     Left Ear: Hearing normal.  Eyes:     General: Lids are normal.        Right eye: No discharge.        Left eye: No discharge.     Conjunctiva/sclera: Conjunctivae normal.     Pupils: Pupils are equal, round, and reactive to light.  Neck:     Thyroid: No thyromegaly.     Vascular: Carotid bruit (right, soft) present.  Cardiovascular:     Rate and Rhythm: Normal rate and regular rhythm.     Pulses:          Dorsalis pedis pulses are 2+ on the right side and 2+ on the left side.       Posterior tibial pulses are 2+ on the right side and 2+ on the left side.  Heart sounds: Normal heart sounds. No murmur heard.   No gallop.  Pulmonary:     Effort: Pulmonary effort is normal. No accessory muscle usage or respiratory distress.     Breath sounds: Normal breath sounds.  Abdominal:     General: Bowel sounds are normal.     Palpations: Abdomen is soft.  Musculoskeletal:     Cervical back: Normal range of motion and neck supple.     Right lower leg: No edema.     Left lower leg: No edema.  Feet:     Right foot:     Protective Sensation: 10 sites tested.  8 sites sensed.     Skin integrity: Skin integrity normal.     Toenail Condition: Right toenails are normal.     Left foot:     Protective Sensation: 10 sites tested.  8 sites sensed.     Skin integrity: Skin integrity normal.     Toenail Condition: Left toenails are normal.  Lymphadenopathy:     Cervical: No cervical adenopathy.  Skin:    General: Skin is warm and dry.  Neurological:     Mental Status: She is alert and oriented to person, place, and time.     Cranial Nerves: Cranial nerves 2-12 are intact.     Motor: Tremor (bilateral hands, mild at rest) present. No weakness.     Coordination: Coordination is intact.     Gait: Gait is intact.     Deep Tendon Reflexes: Reflexes are normal and symmetric.     Reflex Scores:      Brachioradialis reflexes are 2+ on the right side  and 2+ on the left side.      Patellar reflexes are 2+ on the right side and 2+ on the left side.    Comments: Sensation to fingertips and toes decreased bilaterally, but up arms and legs intact.  Psychiatric:        Attention and Perception: Attention normal.        Mood and Affect: Mood normal.        Speech: Speech normal.        Behavior: Behavior normal. Behavior is cooperative.        Thought Content: Thought content normal.    Results for orders placed or performed in visit on 05/07/21  Microalbumin, Urine Waived  Result Value Ref Range   Microalb, Ur Waived 30 (H) 0 - 19 mg/L   Creatinine, Urine Waived 100 10 - 300 mg/dL   Microalb/Creat Ratio <30 <30 mg/g  Bayer DCA Hb A1c Waived  Result Value Ref Range   HB A1C (BAYER DCA - WAIVED) 5.6 4.8 - 5.6 %      Assessment & Plan:   Problem List Items Addressed This Visit       Cardiovascular and Mediastinum   Benign hypertension    Chronic, stable with BP at goal and HR tightly controlled.  Continue current diet control. Discussed at length with patient.  Recommend she monitor BP at least a few mornings a week at home and document for provider.  DASH diet at home.  Labs today: BMP, urine ALB, lipid.         Relevant Orders   Basic metabolic panel   Microalbumin, Urine Waived (Completed)     Digestive   GERD (gastroesophageal reflux disease)    Chronic, ongoing.  She tried stopping Protonix with return of symptoms in past.  Recommend she continue current regimen and adjust as needed.  Mag  level annually.        Nervous and Auditory   Essential tremor    Ongoing since childhood, familial, sister has as well.  Tried Propranolol in past with side effects presenting and stopped.  Referral to neurology has been placed.      Neuropathy    Ongoing issue with fatigue and weakness ongoing. Has had MRI, carotid u/s, and multiple labs to rule our tick borne disease and other disease processes, all have been reassuring.   ?fibromyalgia vs other neurologic condition presenting.  Will obtain BMP, B12, A1c today.  Trial Duloxetine, stop Sertraline.  Referral to neurology placed.      Relevant Orders   Ambulatory referral to Neurology     Musculoskeletal and Integument   Osteopenia    Chronic, stable.  Plan for repeat DEXA in 2024.  Check Vit D level and continue supplements at home.      Relevant Orders   VITAMIN D 25 Hydroxy (Vit-D Deficiency, Fractures)     Other   B12 deficiency    Ongoing.  Continue daily supplement and recheck B12 level today.      Relevant Orders   Vitamin B12   Elevated hemoglobin A1c    Last 5.7% last visit, will recheck today.  Initiate medication as needed based on findings.      Relevant Orders   Basic metabolic panel   Microalbumin, Urine Waived (Completed)   Bayer DCA Hb A1c Waived (Completed)   Hypercholesterolemia    Chronic, ongoing.  Continue current medication regimen and adjust as needed. Lipid panel and BMP today.        Relevant Orders   Lipid Panel w/o Chol/HDL Ratio   Major depression - Primary    Chronic, ongoing.  Denies SI/HI.  Suspect some of fatigue may be related to mood at this time, recommend continue therapy through her church for spiritual guidance. Will trial change to Cymbalta and stopping Sertraline to see if this would offer benefit to both mood and neuropathy -- ?fibromyalgia vs neuropathy.  Will do a direct switch.  She has tolerate Cymbalta before she reports.  Return in 5 weeks.      Relevant Medications   DULoxetine (CYMBALTA) 30 MG capsule   Right carotid bruit    Imaging last = 03/22/20 imaging = Moderate amount of bilateral atherosclerotic plaque, not resulting in a hemodynamically significant stenosis.  No current symptoms, will monitor and may repeat imaging in 1-2 years to ensure no worsening plaque.  Continue statin therapy.        Follow up plan: Return in about 6 weeks (around 06/18/2021) for Neuropathy and Fatigue.

## 2021-05-07 NOTE — Assessment & Plan Note (Signed)
Chronic, stable with BP at goal and HR tightly controlled.  Continue current diet control. Discussed at length with patient.  Recommend she monitor BP at least a few mornings a week at home and document for provider.  DASH diet at home.  Labs today: BMP, urine ALB, lipid.   ? ?

## 2021-05-07 NOTE — Assessment & Plan Note (Signed)
Chronic, ongoing.  Continue current medication regimen and adjust as needed. Lipid panel and BMP today.   ?

## 2021-05-07 NOTE — Assessment & Plan Note (Addendum)
Imaging last = 03/22/20 imaging = Moderate amount of bilateral atherosclerotic plaque, not resulting in a hemodynamically significant stenosis.  No current symptoms, will monitor and may repeat imaging in 1-2 years to ensure no worsening plaque.  Continue statin therapy. ?

## 2021-05-07 NOTE — Assessment & Plan Note (Signed)
Chronic, ongoing.  Denies SI/HI.  Suspect some of fatigue may be related to mood at this time, recommend continue therapy through her church for spiritual guidance. Will trial change to Cymbalta and stopping Sertraline to see if this would offer benefit to both mood and neuropathy -- ?fibromyalgia vs neuropathy.  Will do a direct switch.  She has tolerate Cymbalta before she reports.  Return in 5 weeks. ?

## 2021-05-07 NOTE — Assessment & Plan Note (Signed)
Last 5.7% last visit, will recheck today.  Initiate medication as needed based on findings. ?

## 2021-05-07 NOTE — Assessment & Plan Note (Signed)
Chronic, stable.  Plan for repeat DEXA in 2024.  Check Vit D level and continue supplements at home. 

## 2021-05-08 ENCOUNTER — Other Ambulatory Visit: Payer: Self-pay | Admitting: Nurse Practitioner

## 2021-05-08 LAB — BASIC METABOLIC PANEL
BUN/Creatinine Ratio: 22 (ref 12–28)
BUN: 20 mg/dL (ref 8–27)
CO2: 21 mmol/L (ref 20–29)
Calcium: 9.6 mg/dL (ref 8.7–10.3)
Chloride: 104 mmol/L (ref 96–106)
Creatinine, Ser: 0.89 mg/dL (ref 0.57–1.00)
Glucose: 106 mg/dL — ABNORMAL HIGH (ref 70–99)
Potassium: 4.4 mmol/L (ref 3.5–5.2)
Sodium: 140 mmol/L (ref 134–144)
eGFR: 69 mL/min/{1.73_m2} (ref >59)

## 2021-05-08 LAB — LIPID PANEL W/O CHOL/HDL RATIO
Cholesterol, Total: 176 mg/dL (ref 100–199)
HDL: 42 mg/dL (ref >39)
LDL Chol Calc (NIH): 105 mg/dL — ABNORMAL HIGH (ref 0–99)
Triglycerides: 166 mg/dL — ABNORMAL HIGH (ref 0–149)
VLDL Cholesterol Cal: 29 mg/dL (ref 5–40)

## 2021-05-08 LAB — VITAMIN B12: Vitamin B-12: 291 pg/mL (ref 232–1245)

## 2021-05-08 LAB — VITAMIN D 25 HYDROXY (VIT D DEFICIENCY, FRACTURES): Vit D, 25-Hydroxy: 28.4 ng/mL — ABNORMAL LOW (ref 30.0–100.0)

## 2021-05-08 MED ORDER — ROSUVASTATIN CALCIUM 40 MG PO TABS: 40.0000 mg | ORAL_TABLET | Freq: Every day | ORAL | 4 refills | Status: DC

## 2021-05-08 NOTE — Progress Notes (Signed)
Stop Atorvastatin and start Rosuvastatin. ?

## 2021-05-08 NOTE — Progress Notes (Signed)
Good morning crew, please let Nashonda know her labs have returned: ?- Kidney function, creatinine and eGFR, remains normal, as is liver function, AST and ALT.   ?- Cholesterol levels remain above goal with LDL, I would recommend we stop Atorvastatin and change to Rosuvastatin 40 MG which may offer benefit, will send this in. ?- Vitamin D remains mildly on low side, are you taking Vitamin D3 2000 units daily, if not then start this.  Please let me know. ?- Vitamin B12 remains on lower side, are you taking supplement daily?  If you are then I recommend we change this to monthly injections by the nurses in office.  If you are not taking supplement please start Vitamin B12 1000 MCG daily as low levels can cause fatigue, brain fog, and neuropathic type discomfort like you are having.  Please let me know if you are taking supplement.  Any questions? ?Keep being amazing!!  Thank you for allowing me to participate in your care.  I appreciate you. ?Kindest regards, ?Vala Raffo' ?

## 2021-06-22 ENCOUNTER — Ambulatory Visit: Payer: Medicare PPO | Admitting: Nurse Practitioner

## 2021-06-25 ENCOUNTER — Ambulatory Visit (INDEPENDENT_AMBULATORY_CARE_PROVIDER_SITE_OTHER): Payer: Medicare PPO | Admitting: *Deleted

## 2021-06-25 DIAGNOSIS — Z0000 Encounter for general adult medical examination without abnormal findings: Secondary | ICD-10-CM

## 2021-06-25 DIAGNOSIS — Z780 Asymptomatic menopausal state: Secondary | ICD-10-CM

## 2021-06-25 NOTE — Progress Notes (Signed)
? ?Subjective:  ? Tabitha Larson is a 71 y.o. female who presents for Medicare Annual (Subsequent) preventive examination. ? ?I connected with  [name] on 06/25/21 by a telephone enabled telemedicine application and verified that I am speaking with the correct person using two identifiers. ?  ?I discussed the limitations of evaluation and management by telemedicine. The patient expressed understanding and agreed to proceed. ? ?Patient location: home ? ?Provider location: Tele-Health not in office ? ? ? ?Review of Systems    ? ?Cardiac Risk Factors include: advanced age (>74mn, >>100women);hypertension;sedentary lifestyle ? ?   ?Objective:  ?  ?Today's Vitals  ? ?There is no height or weight on file to calculate BMI. ? ? ?  06/25/2021  ?  9:06 AM 06/23/2020  ?  9:02 AM 05/16/2020  ?  9:39 AM 06/14/2019  ?  9:36 AM 01/11/2019  ?  6:10 PM 12/17/2018  ?  9:42 AM 05/18/2018  ? 11:06 AM  ?Advanced Directives  ?Does Patient Have a Medical Advance Directive? Yes Yes Yes Yes No No   ?Type of [place]      ?Does patient want to make changes to medical advance directive?    Yes (MAU/Ambulatory/Procedural Areas - Information given)     ?Copy of [place]? No - copy requested No - copy requested No - copy requested      ?Would patient like information on creating a medical advance directive?     No - Patient declined Yes (MAU/Ambulatory/Procedural Areas - Information given) No - Patient declined  ? ? ?Current Medications (verified) ?Outpatient Encounter Medications as of 06/25/2021  ?Medication Sig  ? DULoxetine (CYMBALTA) 30 MG capsule Start at 30 MG (1 tablet) by mouth daily and then in one week may increase to 60 MG (2 tablets) by mouth daily.  ? pantoprazole (PROTONIX) 20 MG tablet Take 1 tablet by mouth once daily  ? rosuvastatin (CRESTOR) 40 MG tablet Take 1 tablet (40 mg  total) by mouth daily.  ? vitamin B-12 (CYANOCOBALAMIN) 1000 MCG tablet Take 1,000 mcg by mouth daily.  ? ?No facility-administered encounter medications on file as of 06/25/2021.  ? ? ?Allergies (verified) ?Patient has no known allergies.  ? ?History: ?Past Medical History:  ?Diagnosis Date  ? Ankle fracture   ? Anxiety   ? Depression   ? GERD (gastroesophageal reflux disease)   ? Hyperlipidemia   ? Hypertension   ? Migraine   ? Osteoporosis   ? PONV (postoperative nausea and vomiting)   ? after ankle repair   ? Wears dentures   ? full upper and lower  ? ?Past Surgical History:  ?Procedure Laterality Date  ? ANKLE FRACTURE SURGERY    ? CATARACT EXTRACTION Right 01/2014  ? CATARACT EXTRACTION W/PHACO Left 03/26/2016  ? Procedure: CATARACT EXTRACTION PHACO AND INTRAOCULAR LENS PLACEMENT ([place]  left;  Surgeon: BEulogio Bear MD;  [facility]  Service: Ophthalmology;  Laterality: Left;  ? COLONOSCOPY  02/04/2014  ? COLONOSCOPY WITH PROPOFOL N/A 07/28/2015  ? Procedure: COLONOSCOPY WITH PROPOFOL;  Surgeon: DLucilla Lame MD;  [place] [facility]  Service: Endoscopy;  Laterality: N/A;  No anesthesia  ? COLONOSCOPY WITH PROPOFOL N/A 05/16/2020  ? Procedure: COLONOSCOPY WITH PROPOFOL;  Surgeon: WLucilla Lame MD;  [facility]  Service: Endoscopy;  Laterality: N/A;  ? ESOPHAGOGASTRODUODENOSCOPY (EGD) WITH PROPOFOL N/A 12/17/2018  ?  Procedure: ESOPHAGOGASTRODUODENOSCOPY (EGD) WITH Dilation;  Surgeon: Lucilla Lame, MD;  Location: [facility];  Service: Endoscopy;  Laterality: N/A;  requests mid-morning arrival  ? MANDIBLE SURGERY    ? POLYPECTOMY  07/28/2015  ? Procedure: POLYPECTOMY;  Surgeon: Lucilla Lame, MD;  Location: [facility];  Service: Endoscopy;;  ? ?Family History  ?Problem Relation Age of Onset  ? Stroke Mother   ? Prostate cancer Father   ? Colon cancer Sister   ? Hypertension Sister   ? Hyperlipidemia Sister   ? Breast cancer Neg Hx   ? ?Social History   ? ?Socioeconomic History  ? Marital status: Divorced  ?  Spouse name: Not on file  ? Number of children: Not on file  ? Years of education: Not on file  ? Highest education level: High school graduate  ?Occupational History  ? Occupation: retired  ?Tobacco Use  ? Smoking status: Former  ?  Types: Cigarettes  ?  Quit date: 01/05/1984  ?  Years since quitting: 37.4  ? Smokeless tobacco: Never  ?Vaping Use  ? Vaping Use: Never used  ?Substance and Sexual Activity  ? Alcohol use: No  ?  Alcohol/week: 0.0 standard drinks  ? Drug use: No  ? Sexual activity: Not Currently  ?  Birth control/protection: Post-menopausal  ?Other Topics Concern  ? Not on file  ?Social History Narrative  ? Niece lives in area  ? No kids  ? Has friends close by  ? Cousins live with her currently   ? ?Social Determinants of Health  ? ?Financial Resource Strain: Low Risk   ? Difficulty of Paying Living Expenses: Not hard at all  ?Food Insecurity: No Food Insecurity  ? Worried About Charity fundraiser in the Last Year: Never true  ? Ran Out of Food in the Last Year: Never true  ?Transportation Needs: No Transportation Needs  ? Lack of Transportation (Medical): No  ? Lack of Transportation (Non-Medical): No  ?Physical Activity: Inactive  ? Days of Exercise per Week: 0 days  ? Minutes of Exercise per Session: 0 min  ?Stress: No Stress Concern Present  ? Feeling of Stress : Not at all  ?Social Connections: Moderately Isolated  ? Frequency of Communication with Friends and Family: Three times a week  ? Frequency of Social Gatherings with Friends and Family: Once a week  ? Attends Religious Services: More than 4 times per year  ? Active Member of Clubs or Organizations: No  ? Attends Archivist Meetings: Never  ? Marital Status: Divorced  ? ? ?Tobacco Counseling ?Counseling given: Not Answered ? ? ?Clinical Intake: ? ?Pre-visit preparation completed: Yes ? ?Pain : No/denies pain ? ?  ? ?Nutritional Risks: None ?Diabetes: No ? ?How often do you  need to have someone help you when you read instructions, pamphlets, or other written materials from your doctor or pharmacy?: 1 - Never ? ?Diabetic?  no ? ?Interpreter Needed?: No ? ?Information entered by :: Leroy Kennedy LPN ? ? ?Activities of Daily Living ? ?  06/25/2021  ?  9:09 AM  ?In your present state of health, do you have any difficulty performing the following activities:  ?Hearing? 0  ?Vision? 0  ?Difficulty concentrating or making decisions? 0  ?Walking or climbing stairs? 0  ?Dressing or bathing? 0  ?Doing errands, shopping? 0  ?Preparing Food and eating ? N  ?Using the Toilet? N  ?In the past six months, have you accidently leaked urine? N  ?  Do you have problems with loss of bowel control? N  ?Managing your Medications? N  ?Managing your Finances? N  ?Housekeeping or managing your Housekeeping? N  ? ? ?Patient Care Team: ?Venita Lick, NP as PCP - General (Nurse Practitioner) ?Eulogio Bear, MD as Consulting Physician (Ophthalmology) ? ?Indicate any recent Medical Services you may have received from other than [facility] providers in the past year (date may be approximate). ? ?   ?Assessment:  ? This is a routine wellness examination for Nahlia. ? ?Hearing/Vision screen ?Hearing Screening - Comments:: No trouble hearing ?Vision Screening - Comments:: Dr. Edison Pace ?Up to date ? ?Dietary issues and exercise activities discussed: ?Current Exercise Habits: The patient does not participate in regular exercise at present, Exercise limited by: None identified ? ? Goals Addressed   ? ?  ?  ?  ?  ? This Visit's Progress  ?  Patient Stated     ?  Would like to loose weight and cut back on sugar ?  ? ?  ? ?Depression Screen ? ?  06/25/2021  ?  9:07 AM 05/07/2021  ?  9:16 AM 03/02/2021  ? 10:03 AM 10/16/2020  ?  8:43 AM 06/23/2020  ?  9:03 AM 04/17/2020  ? 11:33 AM 03/21/2020  ? 10:54 AM  ?PHQ 2/9 Scores  ?PHQ - 2 Score '2 2 2 1 3 '$ 0 0  ?PHQ- 9 Score '5 10 8 12 7 '$ 0   ?  ?Fall Risk ? ?  06/25/2021  ?  9:06 AM 03/02/2021  ?  10:03 AM 06/23/2020  ?  9:02 AM 04/17/2020  ? 11:33 AM 03/21/2020  ? 10:54 AM  ?Fall Risk   ?Falls in the past year? 0 0 0 0 0  ?Number falls in past yr: 0 0     ?Injury with Fall? 0 0     ?Risk for fall due t

## 2021-06-25 NOTE — Patient Instructions (Signed)
Tabitha Larson , ?Thank you for taking time to come for your Medicare Wellness Visit. I appreciate your ongoing commitment to your health goals. Please review the following plan we discussed and let me know if I can assist you in the future.  ? ?Screening recommendations/referrals: ?Colonoscopy: up to date ?Mammogram: Education provided ?Bone Density: Education provided ?Recommended yearly ophthalmology/optometry visit for glaucoma screening and checkup ?Recommended yearly dental visit for hygiene and checkup ? ?Vaccinations: ?Influenza vaccine: up to date ?Pneumococcal vaccine: up to date ?Tdap vaccine: Education provided ?Shingles vaccine: Education provided   ? ?Advanced directives: Education provided ? ?Conditions/risks identified:  ? ?Next appointment: 08-17-2021  @ 1:00  [place] ? ? ?Preventive Care 73 Years and Older, Female ?Preventive care refers to lifestyle choices and visits with your health care provider that can promote health and wellness. ?What does preventive care include? ?A yearly physical exam. This is also called an annual well check. ?Dental exams once or twice a year. ?Routine eye exams. Ask your health care provider how often you should have your eyes checked. ?Personal lifestyle choices, including: ?Daily care of your teeth and gums. ?Regular physical activity. ?Eating a healthy diet. ?Avoiding tobacco and drug use. ?Limiting alcohol use. ?Practicing safe sex. ?Taking low-dose aspirin every day. ?Taking vitamin and mineral supplements as recommended by your health care provider. ?What happens during an annual well check? ?The services and screenings done by your health care provider during your annual well check will depend on your age, overall health, lifestyle risk factors, and family history of disease. ?Counseling  ?Your health care provider may ask you questions about your: ?Alcohol use. ?Tobacco use. ?Drug use. ?Emotional well-being. ?Home and relationship well-being. ?Sexual  activity. ?Eating habits. ?History of falls. ?Memory and ability to understand (cognition). ?Work and work Statistician. ?Reproductive health. ?Screening  ?You may have the following tests or measurements: ?Height, weight, and BMI. ?Blood pressure. ?Lipid and cholesterol levels. These may be checked every 5 years, or more frequently if you are over 18 years old. ?Skin check. ?Lung cancer screening. You may have this screening every year starting at age 73 if you have a 30-pack-year history of smoking and currently smoke or have quit within the past 15 years. ?Fecal occult blood test (FOBT) of the stool. You may have this test every year starting at age 24. ?Flexible sigmoidoscopy or colonoscopy. You may have a sigmoidoscopy every 5 years or a colonoscopy every 10 years starting at age 40. ?Hepatitis C blood test. ?Hepatitis B blood test. ?Sexually transmitted disease (STD) testing. ?Diabetes screening. This is done by checking your blood sugar (glucose) after you have not eaten for a while (fasting). You may have this done every 1-3 years. ?Bone density scan. This is done to screen for osteoporosis. You may have this done starting at age 52. ?Mammogram. This may be done every 1-2 years. Talk to your health care provider about how often you should have regular mammograms. ?Talk with your health care provider about your test results, treatment options, and if necessary, the need for more tests. ?Vaccines  ?Your health care provider may recommend certain vaccines, such as: ?Influenza vaccine. This is recommended every year. ?Tetanus, diphtheria, and acellular pertussis (Tdap, Td) vaccine. You may need a Td booster every 10 years. ?Zoster vaccine. You may need this after age 33. ?Pneumococcal 13-valent conjugate (PCV13) vaccine. One dose is recommended after age 31. ?Pneumococcal polysaccharide (PPSV23) vaccine. One dose is recommended after age 51. ?Talk to your health care provider about  which screenings and vaccines  you need and how often you need them. ?This information is not intended to replace advice given to you by your health care provider. Make sure you discuss any questions you have with your health care provider. ?Document Released: 03/17/2015 Document Revised: 11/08/2015 Document Reviewed: 12/20/2014 ?Elsevier Interactive Patient Education ? 2017 [place]. ? ?Fall Prevention in the Home ?Falls can cause injuries. They can happen to people of all ages. There are many things you can do to make your home safe and to help prevent falls. ?What can I do on the outside of my home? ?Regularly fix the edges of walkways and driveways and fix any cracks. ?Remove anything that might make you trip as you walk through a door, such as a raised step or threshold. ?Trim any bushes or trees on the path to your home. ?Use bright outdoor lighting. ?Clear any walking paths of anything that might make someone trip, such as rocks or tools. ?Regularly check to see if handrails are loose or broken. Make sure that both sides of any steps have handrails. ?Any raised decks and porches should have guardrails on the edges. ?Have any leaves, snow, or ice cleared regularly. ?Use sand or salt on walking paths during winter. ?Clean up any spills in your garage right away. This includes oil or grease spills. ?What can I do in the bathroom? ?Use night lights. ?Install grab bars by the toilet and in the tub and shower. Do not use towel bars as grab bars. ?Use non-skid mats or decals in the tub or shower. ?If you need to sit down in the shower, use a plastic, non-slip stool. ?Keep the floor dry. Clean up any water that spills on the floor as soon as it happens. ?Remove soap buildup in the tub or shower regularly. ?Attach bath mats securely with double-sided non-slip rug tape. ?Do not have throw rugs and other things on the floor that can make you trip. ?What can I do in the bedroom? ?Use night lights. ?Make sure that you have a light by your bed that  is easy to reach. ?Do not use any sheets or blankets that are too big for your bed. They should not hang down onto the floor. ?Have a firm chair that has side arms. You can use this for support while you get dressed. ?Do not have throw rugs and other things on the floor that can make you trip. ?What can I do in the kitchen? ?Clean up any spills right away. ?Avoid walking on wet floors. ?Keep items that you use a lot in easy-to-reach places. ?If you need to reach something above you, use a strong step stool that has a grab bar. ?Keep electrical cords out of the way. ?Do not use floor polish or wax that makes floors slippery. If you must use wax, use non-skid floor wax. ?Do not have throw rugs and other things on the floor that can make you trip. ?What can I do with my stairs? ?Do not leave any items on the stairs. ?Make sure that there are handrails on both sides of the stairs and use them. Fix handrails that are broken or loose. Make sure that handrails are as long as the stairways. ?Check any carpeting to make sure that it is firmly attached to the stairs. Fix any carpet that is loose or worn. ?Avoid having throw rugs at the top or bottom of the stairs. If you do have throw rugs, attach them to the floor with  carpet tape. ?Make sure that you have a light switch at the top of the stairs and the bottom of the stairs. If you do not have them, ask someone to add them for you. ?What else can I do to help prevent falls? ?Wear shoes that: ?Do not have high heels. ?Have rubber bottoms. ?Are comfortable and fit you well. ?Are closed at the toe. Do not wear sandals. ?If you use a stepladder: ?Make sure that it is fully opened. Do not climb a closed stepladder. ?Make sure that both sides of the stepladder are locked into place. ?Ask someone to hold it for you, if possible. ?Clearly mark and make sure that you can see: ?Any grab bars or handrails. ?First and last steps. ?Where the edge of each step is. ?Use tools that help you  move around (mobility aids) if they are needed. These include: ?Canes. ?Walkers. ?Scooters. ?Crutches. ?Turn on the lights when you go into a dark area. Replace any light bulbs as soon as they burn out. ?Set up yo

## 2021-07-29 ENCOUNTER — Other Ambulatory Visit: Payer: Self-pay | Admitting: Nurse Practitioner

## 2021-07-31 NOTE — Telephone Encounter (Signed)
Requested medication (s) are due for refill today -expired Rx  Requested medication (s) are on the active medication list -yes  Future visit scheduled -yes  Last refill: -07/25/20 #90 4RF  Notes to clinic: Expired Rx  Requested Prescriptions  Pending Prescriptions Disp Refills   pantoprazole (PROTONIX) 20 MG tablet [Pharmacy Med Name: Pantoprazole Sodium 20 MG Oral Tablet Delayed Release] 90 tablet 0    Sig: Take 1 tablet by mouth once daily     Gastroenterology: Proton Pump Inhibitors Passed - 07/29/2021 10:09 AM      Passed - Valid encounter within last 12 months    Recent Outpatient Visits           2 months ago Severe episode of recurrent major depressive disorder, without psychotic features ([place])   [place], Jolene T, NP   5 months ago Dizziness   [place], Lauren A, NP   7 months ago Benign hypertension   [place], Scribner T, NP   9 months ago Severe episode of recurrent major depressive disorder, without psychotic features ([place])   [place], Jolene T, NP   11 months ago Breast pain, left   [place], Lauren A, NP       Future Appointments             In 2 weeks Cannady, Barbaraann Faster, NP [place]                Requested Prescriptions  Pending Prescriptions Disp Refills   pantoprazole (PROTONIX) 20 MG tablet [Pharmacy Med Name: Pantoprazole Sodium 20 MG Oral Tablet Delayed Release] 90 tablet 0    Sig: Take 1 tablet by mouth once daily     Gastroenterology: Proton Pump Inhibitors Passed - 07/29/2021 10:09 AM      Passed - Valid encounter within last 12 months    Recent Outpatient Visits           2 months ago Severe episode of recurrent major depressive disorder, without psychotic features ([place])   [place], Jolene T, NP   5 months ago Dizziness   [place], Lauren A, NP   7  months ago Benign hypertension   [place], Jolene T, NP   9 months ago Severe episode of recurrent major depressive disorder, without psychotic features ([place])   [place] Cannady, Jolene T, NP   11 months ago Breast pain, left   [place], Lauren A, NP       Future Appointments             In 2 weeks Cannady, Barbaraann Faster, NP [facility] [employer], [facility]

## 2021-08-08 DIAGNOSIS — R200 Anesthesia of skin: Secondary | ICD-10-CM | POA: Diagnosis not present

## 2021-08-08 DIAGNOSIS — E569 Vitamin deficiency, unspecified: Secondary | ICD-10-CM | POA: Diagnosis not present

## 2021-08-08 DIAGNOSIS — R202 Paresthesia of skin: Secondary | ICD-10-CM | POA: Diagnosis not present

## 2021-08-08 DIAGNOSIS — G43709 Chronic migraine without aura, not intractable, without status migrainosus: Secondary | ICD-10-CM | POA: Diagnosis not present

## 2021-08-08 DIAGNOSIS — R42 Dizziness and giddiness: Secondary | ICD-10-CM | POA: Diagnosis not present

## 2021-08-12 NOTE — Patient Instructions (Addendum)
Alpha Lipoic Acid 600 mg a day + start Vitamin D 2000 units daily.  Vitamin B12 Deficiency Vitamin B12 deficiency occurs when the body does not have enough of this important vitamin. The body needs this vitamin: To make red blood cells. To make DNA. This is the genetic material inside cells. To help the nerves work properly so they can carry messages from the brain to the body. Vitamin B12 deficiency can cause health problems, such as not having enough red blood cells in the blood (anemia). This can lead to nerve damage if untreated. What are the causes? This condition may be caused by: Not eating enough foods that contain vitamin B12. Not having enough stomach acid and digestive fluids to properly absorb vitamin B12 from the food that you eat. Having certain diseases that make it hard to absorb vitamin B12. These diseases include Crohn's disease, chronic pancreatitis, and cystic fibrosis. An autoimmune disorder in which the body does not make enough of a protein (intrinsic factor) within the stomach, resulting in not enough absorption of vitamin B12. Having a surgery in which part of the stomach or small intestine is removed. Taking certain medicines that make it hard for the body to absorb vitamin B12. These include: Heartburn medicines, such as antacids and proton pump inhibitors. Some medicines that are used to treat diabetes. What increases the risk? The following factors may make you more likely to develop a vitamin B12 deficiency: Being an older adult. Eating a vegetarian or vegan diet that does not include any foods that come from animals. Eating a poor diet while you are pregnant. Taking certain medicines. Having alcoholism. What are the signs or symptoms? In some cases, there are no symptoms of this condition. If the condition leads to anemia or nerve damage, various symptoms may occur, such as: Weakness. Tiredness (fatigue). Loss of appetite. Numbness or tingling in your  hands and feet. Redness and burning of the tongue. Depression, confusion, or memory problems. Trouble walking. If anemia is severe, symptoms can include: Shortness of breath. Dizziness. Rapid heart rate. How is this diagnosed? This condition may be diagnosed with a blood test to measure the level of vitamin B12 in your blood. You may also have other tests, including: A group of tests that measure certain characteristics of blood cells (complete blood count, CBC). A blood test to measure intrinsic factor. A procedure where a thin tube with a camera on the end is used to look into your stomach or intestines (endoscopy). Other tests may be needed to discover the cause of the deficiency. How is this treated? Treatment for this condition depends on the cause. This condition may be treated by: Changing your eating and drinking habits, such as: Eating more foods that contain vitamin B12. Drinking less alcohol or no alcohol. Getting vitamin B12 injections. Taking vitamin B12 supplements by mouth (orally). Your health care provider will tell you which dose is best for you. Follow these instructions at home: Eating and drinking  Include foods in your diet that come from animals and contain a lot of vitamin B12. These include: Meats and poultry. This includes beef, pork, chicken, [place], and organ meats, such as liver. Seafood. This includes clams, rainbow trout, salmon, tuna, and haddock. Eggs. Dairy foods such as milk, yogurt, and cheese. Eat foods that have vitamin B12 added to them (are fortified), such as ready-to-eat breakfast cereals. Check the label on the package to see if a food is fortified. The items listed above may not be a  complete list of foods and beverages you can eat and drink. Contact a dietitian for more information. Alcohol use Do not drink alcohol if: Your health care provider tells you not to drink. You are pregnant, may be pregnant, or are planning to become  pregnant. If you drink alcohol: Limit how much you have to: 0-1 drink a day for women. 0-2 drinks a day for men. Know how much alcohol is in your drink. In the [place] one drink equals one 12 oz bottle of beer (355 mL), one 5 oz glass of wine (148 mL), or one 1 oz glass of hard liquor (44 mL). General instructions Get vitamin B12 injections if told to by your health care provider. Take supplements only as told by your health care provider. Follow the directions carefully. Keep all follow-up visits. This is important. Contact a health care provider if: Your symptoms come back. Your symptoms get worse or do not improve with treatment. Get help right away: You develop shortness of breath. You have a rapid heart rate. You have chest pain. You become dizzy or you faint. These symptoms may be an emergency. Get help right away. Call 911. Do not wait to see if the symptoms will go away. Do not drive yourself to the hospital. Summary Vitamin B12 deficiency occurs when the body does not have enough of this important vitamin. Common causes include not eating enough foods that contain vitamin B12, not being able to absorb vitamin B12 from the food that you eat, having a surgery in which part of the stomach or small intestine is removed, or taking certain medicines. Eat foods that have vitamin B12 in them. Treatment may include making a change in the way you eat and drink, getting vitamin B12 injections, or taking vitamin B12 supplements. This information is not intended to replace advice given to you by your health care provider. Make sure you discuss any questions you have with your health care provider. Document Revised: 10/13/2020 Document Reviewed: 10/13/2020 Elsevier Patient Education  [place].

## 2021-08-17 ENCOUNTER — Encounter: Payer: Self-pay | Admitting: Nurse Practitioner

## 2021-08-17 ENCOUNTER — Ambulatory Visit: Payer: Medicare PPO | Admitting: Nurse Practitioner

## 2021-08-17 VITALS — BP 129/79 | HR 63 | Temp 98.1°F | Ht 62.0 in | Wt 141.0 lb

## 2021-08-17 DIAGNOSIS — I10 Essential (primary) hypertension: Secondary | ICD-10-CM

## 2021-08-17 DIAGNOSIS — F332 Major depressive disorder, recurrent severe without psychotic features: Secondary | ICD-10-CM

## 2021-08-17 DIAGNOSIS — G250 Essential tremor: Secondary | ICD-10-CM

## 2021-08-17 DIAGNOSIS — G63 Polyneuropathy in diseases classified elsewhere: Secondary | ICD-10-CM

## 2021-08-17 DIAGNOSIS — M85851 Other specified disorders of bone density and structure, right thigh: Secondary | ICD-10-CM

## 2021-08-17 DIAGNOSIS — E538 Deficiency of other specified B group vitamins: Secondary | ICD-10-CM | POA: Diagnosis not present

## 2021-08-17 DIAGNOSIS — E7800 Pure hypercholesterolemia, unspecified: Secondary | ICD-10-CM

## 2021-08-17 NOTE — Assessment & Plan Note (Signed)
Ongoing issue with fatigue and weakness ongoing. Has had MRI, carotid u/s, and multiple labs to rule our tick borne disease and other disease processes, all have been reassuring.  ?fibromyalgia vs other neurologic condition presenting.  Continue Duloxetine and B12 supplement at this time + collaboration with neurology.

## 2021-08-17 NOTE — Progress Notes (Signed)
BP 129/79   Pulse 63   Temp 98.1 F (36.7 C) (Oral)   Ht '5\' 2"'  (1.575 m)   Wt 141 lb (64 kg)   LMP  (LMP Unknown)   SpO2 97%   BMI 25.79 kg/m    Subjective:    Patient ID: Tabitha Larson, female    DOB: 12/14/1950, 71 y.o.   MRN: [id number]  HPI: Tabitha Larson is a 71 y.o. female  Chief Complaint  Patient presents with   Peripheral Neuropathy   Fatigue   Numbness    Patient states she is still having numbness. Patient says everything is still about the same.    NEUROPATHY Follow-up today for this.  Ongoing issues with all over pain, occasional dizziness intermittent, and some numbness. Last MRI in January 2022 with no acute events noted.  Carotid u/s 03/22/20 with no stenosis, but plaque present bilaterally.  Has had full work up which has been reassuring.  Saw neurology  08/08/21 for initial visit and is scheduled for nerve studies in July -- was instructed to take Alpha Lipoic Acid 600 MG daily + ASA daily.  Currently taking B12 daily.  A1c last visit was 5.6%.  No recent falls or fractures, but does endorse leg weakness at baseline.  Ongoing issue waxing and waning  > 1 year. Has had sleep apnea test about 4-5 years ago which she reports was normal. Neuropathy status: uncontrolled  Satisfied with current treatment?: yes Medication side effects: no Location: feet and hands get very cold and numb Pain: yes Severity: 8/10  Quality:  tingling and pins and needles Frequency: intermittent Bilateral: yes Symmetric: yes Numbness: yes Decreased sensation: yes Weakness: yes Context: fluctuating Alleviating factors: nothing Aggravating factors: nothing Treatments attempted: nothing  Relevant past medical, surgical, family and social history reviewed and updated as indicated. Interim medical history since our last visit reviewed. Allergies and medications reviewed and updated.  Review of Systems  Constitutional:  Positive for fatigue. Negative for activity change,  appetite change, diaphoresis and fever.  Respiratory:  Negative for cough, chest tightness and shortness of breath.   Cardiovascular:  Negative for chest pain, palpitations and leg swelling.  Gastrointestinal: Negative.   Endocrine: Negative for cold intolerance and heat intolerance.  Neurological: Negative.   Psychiatric/Behavioral:  Negative for decreased concentration, self-injury, sleep disturbance and suicidal ideas. The patient is not nervous/anxious.    Per HPI unless specifically indicated above     Objective:    BP 129/79   Pulse 63   Temp 98.1 F (36.7 C) (Oral)   Ht '5\' 2"'  (1.575 m)   Wt 141 lb (64 kg)   LMP  (LMP Unknown)   SpO2 97%   BMI 25.79 kg/m   Wt Readings from Last 3 Encounters:  08/17/21 141 lb (64 kg)  05/07/21 139 lb 6.4 oz (63.2 kg)  03/02/21 137 lb (62.1 kg)    Physical Exam Vitals and nursing note reviewed.  Constitutional:      General: She is awake. She is not in acute distress.    Appearance: She is well-developed and well-groomed. She is not ill-appearing or toxic-appearing.  HENT:     Head: Normocephalic.     Right Ear: Hearing normal.     Left Ear: Hearing normal.  Eyes:     General: Lids are normal.        Right eye: No discharge.        Left eye: No discharge.     Conjunctiva/sclera: Conjunctivae  normal.     Pupils: Pupils are equal, round, and reactive to light.  Neck:     Thyroid: No thyromegaly.     Vascular: No carotid bruit.  Cardiovascular:     Rate and Rhythm: Normal rate and regular rhythm.     Pulses:          Dorsalis pedis pulses are 2+ on the right side and 2+ on the left side.       Posterior tibial pulses are 2+ on the right side and 2+ on the left side.     Heart sounds: Normal heart sounds. No murmur heard.    No gallop.  Pulmonary:     Effort: Pulmonary effort is normal. No accessory muscle usage or respiratory distress.     Breath sounds: Normal breath sounds.  Abdominal:     General: Bowel sounds are normal.      Palpations: Abdomen is soft.  Musculoskeletal:     Cervical back: Normal range of motion and neck supple.     Right lower leg: No edema.     Left lower leg: No edema.  Feet:     Right foot:     Skin integrity: Skin integrity normal.     Toenail Condition: Right toenails are normal.     Left foot:     Skin integrity: Skin integrity normal.     Toenail Condition: Left toenails are normal.  Lymphadenopathy:     Cervical: No cervical adenopathy.  Skin:    General: Skin is warm and dry.  Neurological:     Mental Status: She is alert and oriented to person, place, and time.     Cranial Nerves: Cranial nerves 2-12 are intact.     Motor: Tremor (bilateral hands, mild at rest) present. No weakness.     Coordination: Coordination is intact.     Gait: Gait is intact.     Deep Tendon Reflexes: Reflexes are normal and symmetric.     Reflex Scores:      Brachioradialis reflexes are 2+ on the right side and 2+ on the left side.      Patellar reflexes are 2+ on the right side and 2+ on the left side.    Comments: Sensation to fingertips and toes decreased bilaterally, but up arms and legs intact.  Psychiatric:        Attention and Perception: Attention normal.        Mood and Affect: Mood normal.        Speech: Speech normal.        Behavior: Behavior normal. Behavior is cooperative.        Thought Content: Thought content normal.     Results for orders placed or performed in visit on 37/94/32  Basic metabolic panel  Result Value Ref Range   Glucose 106 (H) 70 - 99 mg/dL   BUN 20 8 - 27 mg/dL   Creatinine, Ser 0.89 0.57 - 1.00 mg/dL   eGFR 69 >59 mL/min/1.73   BUN/Creatinine Ratio 22 12 - 28   Sodium 140 134 - 144 mmol/L   Potassium 4.4 3.5 - 5.2 mmol/L   Chloride 104 96 - 106 mmol/L   CO2 21 20 - 29 mmol/L   Calcium 9.6 8.7 - 10.3 mg/dL  Microalbumin, Urine Waived  Result Value Ref Range   Microalb, Ur Waived 30 (H) 0 - 19 mg/L   Creatinine, Urine Waived 100 10 - 300 mg/dL    Microalb/Creat Ratio <30 <30 mg/g  Lipid Panel w/o Chol/HDL Ratio  Result Value Ref Range   Cholesterol, Total 176 100 - 199 mg/dL   Triglycerides 166 (H) 0 - 149 mg/dL   HDL 42 >39 mg/dL   VLDL Cholesterol Cal 29 5 - 40 mg/dL   LDL Chol Calc (NIH) 105 (H) 0 - 99 mg/dL  VITAMIN D 25 Hydroxy (Vit-D Deficiency, Fractures)  Result Value Ref Range   Vit D, 25-Hydroxy 28.4 (L) 30.0 - 100.0 ng/mL  Vitamin B12  Result Value Ref Range   Vitamin B-12 291 232 - 1,245 pg/mL  Bayer DCA Hb A1c Waived  Result Value Ref Range   HB A1C (BAYER DCA - WAIVED) 5.6 4.8 - 5.6 %      Assessment & Plan:   Problem List Items Addressed This Visit       Nervous and Auditory   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 Has had MRI, carotid u/s, and multiple labs to rule our tick borne disease and other disease processes, all have been reassuring.  ?fibromyalgia vs other neurologic condition presenting.  Continue Duloxetine and B12 supplement at this time + collaboration with neurology.      Essential tremor    Ongoing since childhood, familial, sister has as well.  Tried Propranolol in past with side effects presenting and stopped.  Recent initial neurology note reviewed, will continue this collaboration.        Follow up plan: Return in about 5 months (around 01/17/2022) for MOOD, HTN/HLD, GERD, A1c, OSTEOPENIA, NEUROPATHY.

## 2021-08-17 NOTE — Assessment & Plan Note (Signed)
Ongoing since childhood, familial, sister has as well.  Tried Propranolol in past with side effects presenting and stopped.  Recent initial neurology note reviewed, will continue this collaboration. 

## 2021-10-17 DIAGNOSIS — R200 Anesthesia of skin: Secondary | ICD-10-CM | POA: Diagnosis not present

## 2021-10-22 DIAGNOSIS — R200 Anesthesia of skin: Secondary | ICD-10-CM | POA: Diagnosis not present

## 2021-10-22 DIAGNOSIS — R202 Paresthesia of skin: Secondary | ICD-10-CM | POA: Diagnosis not present

## 2021-12-07 DIAGNOSIS — G5603 Carpal tunnel syndrome, bilateral upper limbs: Secondary | ICD-10-CM | POA: Diagnosis not present

## 2021-12-07 DIAGNOSIS — E569 Vitamin deficiency, unspecified: Secondary | ICD-10-CM | POA: Diagnosis not present

## 2021-12-07 DIAGNOSIS — R200 Anesthesia of skin: Secondary | ICD-10-CM | POA: Diagnosis not present

## 2021-12-07 DIAGNOSIS — R202 Paresthesia of skin: Secondary | ICD-10-CM | POA: Diagnosis not present

## 2021-12-07 DIAGNOSIS — G43709 Chronic migraine without aura, not intractable, without status migrainosus: Secondary | ICD-10-CM | POA: Diagnosis not present

## 2022-01-13 DIAGNOSIS — E559 Vitamin D deficiency, unspecified: Secondary | ICD-10-CM | POA: Insufficient documentation

## 2022-01-13 DIAGNOSIS — G5603 Carpal tunnel syndrome, bilateral upper limbs: Secondary | ICD-10-CM | POA: Insufficient documentation

## 2022-01-13 NOTE — Patient Instructions (Signed)
Vitamin B12 Deficiency Vitamin B12 deficiency occurs when the body does not have enough of this important vitamin. The body needs this vitamin: To make red blood cells. To make DNA. This is the genetic material inside cells. To help the nerves work properly so they can carry messages from the brain to the body. Vitamin B12 deficiency can cause health problems, such as not having enough red blood cells in the blood (anemia). This can lead to nerve damage if untreated. What are the causes? This condition may be caused by: Not eating enough foods that contain vitamin B12. Not having enough stomach acid and digestive fluids to properly absorb vitamin B12 from the food that you eat. Having certain diseases that make it hard to absorb vitamin B12. These diseases include Crohn's disease, chronic pancreatitis, and cystic fibrosis. An autoimmune disorder in which the body does not make enough of a protein (intrinsic factor) within the stomach, resulting in not enough absorption of vitamin B12. Having a surgery in which part of the stomach or small intestine is removed. Taking certain medicines that make it hard for the body to absorb vitamin B12. These include: Heartburn medicines, such as antacids and proton pump inhibitors. Some medicines that are used to treat diabetes. What increases the risk? The following factors may make you more likely to develop a vitamin B12 deficiency: Being an older adult. Eating a vegetarian or vegan diet that does not include any foods that come from animals. Eating a poor diet while you are pregnant. Taking certain medicines. Having alcoholism. What are the signs or symptoms? In some cases, there are no symptoms of this condition. If the condition leads to anemia or nerve damage, various symptoms may occur, such as: Weakness. Tiredness (fatigue). Loss of appetite. Numbness or tingling in your hands and feet. Redness and burning of the tongue. Depression,  confusion, or memory problems. Trouble walking. If anemia is severe, symptoms can include: Shortness of breath. Dizziness. Rapid heart rate. How is this diagnosed? This condition may be diagnosed with a blood test to measure the level of vitamin B12 in your blood. You may also have other tests, including: A group of tests that measure certain characteristics of blood cells (complete blood count, CBC). A blood test to measure intrinsic factor. A procedure where a thin tube with a camera on the end is used to look into your stomach or intestines (endoscopy). Other tests may be needed to discover the cause of the deficiency. How is this treated? Treatment for this condition depends on the cause. This condition may be treated by: Changing your eating and drinking habits, such as: Eating more foods that contain vitamin B12. Drinking less alcohol or no alcohol. Getting vitamin B12 injections. Taking vitamin B12 supplements by mouth (orally). Your health care provider will tell you which dose is best for you. Follow these instructions at home: Eating and drinking  Include foods in your diet that come from animals and contain a lot of vitamin B12. These include: Meats and poultry. This includes beef, pork, chicken, turkey, and organ meats, such as liver. Seafood. This includes clams, rainbow trout, salmon, tuna, and haddock. Eggs. Dairy foods such as milk, yogurt, and cheese. Eat foods that have vitamin B12 added to them (are fortified), such as ready-to-eat breakfast cereals. Check the label on the package to see if a food is fortified. The items listed above may not be a complete list of foods and beverages you can eat and drink. Contact a dietitian for   more information. Alcohol use Do not drink alcohol if: Your health care provider tells you not to drink. You are pregnant, may be pregnant, or are planning to become pregnant. If you drink alcohol: Limit how much you have to: 0-1 drink a  day for women. 0-2 drinks a day for men. Know how much alcohol is in your drink. In the [place] one drink equals one 12 oz bottle of beer (355 mL), one 5 oz glass of wine (148 mL), or one 1 oz glass of hard liquor (44 mL). General instructions Get vitamin B12 injections if told to by your health care provider. Take supplements only as told by your health care provider. Follow the directions carefully. Keep all follow-up visits. This is important. Contact a health care provider if: Your symptoms come back. Your symptoms get worse or do not improve with treatment. Get help right away: You develop shortness of breath. You have a rapid heart rate. You have chest pain. You become dizzy or you faint. These symptoms may be an emergency. Get help right away. Call 911. Do not wait to see if the symptoms will go away. Do not drive yourself to the hospital. Summary Vitamin B12 deficiency occurs when the body does not have enough of this important vitamin. Common causes include not eating enough foods that contain vitamin B12, not being able to absorb vitamin B12 from the food that you eat, having a surgery in which part of the stomach or small intestine is removed, or taking certain medicines. Eat foods that have vitamin B12 in them. Treatment may include making a change in the way you eat and drink, getting vitamin B12 injections, or taking vitamin B12 supplements. This information is not intended to replace advice given to you by your health care provider. Make sure you discuss any questions you have with your health care provider. Document Revised: 10/13/2020 Document Reviewed: 10/13/2020 [employer] Patient Education  2023 [employer]  

## 2022-01-17 ENCOUNTER — Ambulatory Visit: Payer: Medicare PPO | Admitting: Nurse Practitioner

## 2022-01-17 ENCOUNTER — Encounter: Payer: Self-pay | Admitting: Nurse Practitioner

## 2022-01-17 VITALS — BP 122/77 | HR 67 | Temp 98.1°F | Ht 62.01 in | Wt 141.6 lb

## 2022-01-17 DIAGNOSIS — G5603 Carpal tunnel syndrome, bilateral upper limbs: Secondary | ICD-10-CM | POA: Diagnosis not present

## 2022-01-17 DIAGNOSIS — F332 Major depressive disorder, recurrent severe without psychotic features: Secondary | ICD-10-CM | POA: Diagnosis not present

## 2022-01-17 DIAGNOSIS — I10 Essential (primary) hypertension: Secondary | ICD-10-CM | POA: Diagnosis not present

## 2022-01-17 DIAGNOSIS — G63 Polyneuropathy in diseases classified elsewhere: Secondary | ICD-10-CM | POA: Diagnosis not present

## 2022-01-17 DIAGNOSIS — E7800 Pure hypercholesterolemia, unspecified: Secondary | ICD-10-CM

## 2022-01-17 DIAGNOSIS — K219 Gastro-esophageal reflux disease without esophagitis: Secondary | ICD-10-CM | POA: Diagnosis not present

## 2022-01-17 DIAGNOSIS — M85851 Other specified disorders of bone density and structure, right thigh: Secondary | ICD-10-CM

## 2022-01-17 DIAGNOSIS — R7309 Other abnormal glucose: Secondary | ICD-10-CM

## 2022-01-17 DIAGNOSIS — Z23 Encounter for immunization: Secondary | ICD-10-CM

## 2022-01-17 DIAGNOSIS — E559 Vitamin D deficiency, unspecified: Secondary | ICD-10-CM | POA: Diagnosis not present

## 2022-01-17 DIAGNOSIS — E538 Deficiency of other specified B group vitamins: Secondary | ICD-10-CM | POA: Diagnosis not present

## 2022-01-17 DIAGNOSIS — G250 Essential tremor: Secondary | ICD-10-CM | POA: Diagnosis not present

## 2022-01-17 NOTE — Progress Notes (Signed)
BP 122/77   Pulse 67   Temp 98.1 F (36.7 C) (Oral)   Ht 5' 2.01" (1.575 m)   Wt 141 lb 9.6 oz (64.2 kg)   LMP  (LMP Unknown)   SpO2 95%   BMI 25.89 kg/m    Subjective:    Patient ID: Tabitha Larson, female    DOB: [birth year], 71 y.o.   MRN: [id number]  HPI: Tabitha Larson is a 71 y.o. female  Chief Complaint  Patient presents with   Hyperlipidemia   Gastroesophageal Reflux   Osteopenia   Peripheral Neuropathy   Depression   HYPERTENSION / HYPERLIPIDEMIA Continues Rosuvastatin and tolerating.  No blood pressure medications. Satisfied with current treatment? yes Duration of hypertension: chronic BP monitoring frequency: not checking BP range:  BP medication side effects: no Duration of hyperlipidemia: chronic Cholesterol medication side effects: no Cholesterol supplements: none Medication compliance: good compliance Aspirin: no Recent stressors: no Recurrent headaches: no Visual changes: no Palpitations: no Dyspnea: no Chest pain: no Lower extremity edema: no Dizzy/lightheaded: no  The 10-year ASCVD risk score (Arnett DK, et al., 2019) is: 11.9%   Values used to calculate the score:     Age: 77 years     Sex: Female     Is Non-Hispanic African American: No     Diabetic: No     Tobacco smoker: No     Systolic Blood Pressure: 415 mmHg     Is BP treated: No     HDL Cholesterol: 42 mg/dL     Total Cholesterol: 176 mg/dL  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  Has bilateral carpal tunnel and B12 deficiency.  MRI in January 2022 with no acute events noted.  Carotid u/s 03/22/20 with no stenosis, but plaque present bilaterally.  A1c 5.6% most recent visit. Has had Larson apnea test about 4-5 years ago which she reports was normal. Neuropathy status: fluctuates Satisfied with current treatment?: yes Medication side effects: no Location: feet and hands get very cold and numb Pain: yes Severity: 5/10 Quality:  tingling and pins and  needles Frequency: intermittent Bilateral: yes Symmetric: yes Numbness: yes Decreased sensation: yes Weakness: yes, especially in morning Context: fluctuating Alleviating factors: nothing Aggravating factors: nothing Treatments attempted: B12 supplement  GERD Continues Protonix -- if takes this has no issues, but if does not take heart burn returns.  GERD control status: stable  Satisfied with current treatment? yes Heartburn frequency: every day without medicine, with medicine hardly ever had Medication side effects: no  Medication compliance: stable Previous GERD medications: TUMS Antacid use frequency: every day Dysphagia: no Odynophagia:  no Hematemesis: no Blood in stool: no EGD: October 2020, yes  OSTEOPENIA Last DEXA = T-score of -1.9 in 2019. Satisfied with current treatment?: yes Adequate calcium & vitamin D: no, she forgot Weight bearing exercises: yes   DEPRESSION Continues on Duloxetine.  She reports no increase in depression and anxiety.   Mood status: stable Satisfied with current treatment?: yes Symptom severity: mild  Duration of current treatment : chronic Side effects: no Medication compliance: good compliance Psychotherapy/counseling: none Depressed mood:  occasional Anxious mood: no Anhedonia: no Significant weight loss or gain: no Insomnia: none Fatigue: a little bit Feelings of worthlessness or guilt: no Impaired concentration/indecisiveness: no Suicidal ideations: no Hopelessness: no Crying spells: no    01/17/2022   10:42 AM 08/17/2021    1:05 PM 06/25/2021    9:07 AM 05/07/2021    9:16 AM 03/02/2021  10:03 AM  Depression screen PHQ 2/9  Decreased Interest _0 0 1  Down, Depressed, Hopeless _1 PHQ - 2 Score _2 Altered sleeping _3 0 0  Tired, decreased energy _4 Change in appetite 3 3 0 3 3  Feeling bad or failure about yourself  2 1 0 1 1  Trouble concentrating _5 Moving slowly or  fidgety/restless 0 0 0 0 0  Suicidal thoughts 0 0 0 0 0  PHQ-9 Score _6 Difficult doing work/chores Somewhat difficult  Not difficult at all Somewhat difficult Somewhat difficult      01/17/2022   10:43 AM 08/17/2021    1:06 PM 03/02/2021   10:04 AM 10/07/2019    9:48 AM  GAD 7 : Generalized Anxiety Score  Nervous, Anxious, on Edge _7 0  Control/stop worrying _8 Worry too much - different things _9 Trouble relaxing _10 Restless _11 Easily annoyed or irritable _12 Afraid - awful might happen _13 Total GAD 7 Score _14 Anxiety Difficulty Somewhat difficult Somewhat difficult Not difficult at all Not difficult at all    Relevant past medical, surgical, family and social history reviewed and updated as indicated. Interim medical history since our last visit reviewed. Allergies and medications reviewed and updated.  Review of Systems  Constitutional:  Positive for fatigue. Negative for activity change, appetite change, diaphoresis and fever.  Respiratory:  Negative for cough, chest tightness and shortness of breath.   Cardiovascular:  Negative for chest pain, palpitations and leg swelling.  Gastrointestinal: Negative.   Endocrine: Negative for cold intolerance and heat intolerance.  Neurological: Negative.   Psychiatric/Behavioral:  Negative for decreased concentration, self-injury, Larson disturbance and suicidal ideas. The patient is not nervous/anxious.     Per HPI unless specifically indicated above     Objective:    BP 122/77   Pulse 67   Temp 98.1 F (36.7 C) (Oral)   Ht 5' 2.01" (1.575 m)   Wt 141 lb 9.6 oz (64.2 kg)   LMP  (LMP Unknown)   SpO2 95%   BMI 25.89 kg/m   Wt Readings from Last 3 Encounters:  01/17/22 141 lb 9.6 oz (64.2 kg)  08/17/21 141 lb (64 kg)  05/07/21 139 lb 6.4 oz (63.2 kg)    Physical Exam Vitals and nursing note reviewed.  Constitutional:      General: She is awake. She is not in acute  distress.    Appearance: She is well-developed and well-groomed. She is not ill-appearing or toxic-appearing.  HENT:     Head: Normocephalic.     Right Ear: Hearing normal.     Left Ear: Hearing normal.  Eyes:     General: Lids are normal.        Right eye: No discharge.        Left eye: No discharge.     Conjunctiva/sclera: Conjunctivae normal.     Pupils: Pupils are equal, round, and reactive to light.  Neck:     Thyroid: No thyromegaly.     Vascular: Carotid bruit (right, soft) present.  Cardiovascular:     Rate and Rhythm: Normal rate and regular rhythm.     Heart sounds: Normal  heart sounds. No murmur heard.    No gallop.  Pulmonary:     Effort: Pulmonary effort is normal. No accessory muscle usage or respiratory distress.     Breath sounds: Normal breath sounds.  Abdominal:     General: Bowel sounds are normal.     Palpations: Abdomen is soft.  Musculoskeletal:     Cervical back: Normal range of motion and neck supple.     Right lower leg: No edema.     Left lower leg: No edema.  Lymphadenopathy:     Cervical: No cervical adenopathy.  Skin:    General: Skin is warm and dry.  Neurological:     Mental Status: She is alert and oriented to person, place, and time.     Cranial Nerves: Cranial nerves 2-12 are intact.     Motor: Tremor (bilateral hands, mild at rest) present. No weakness.     Coordination: Coordination is intact.     Gait: Gait is intact.     Deep Tendon Reflexes: Reflexes are normal and symmetric.     Reflex Scores:      Brachioradialis reflexes are 2+ on the right side and 2+ on the left side.      Patellar reflexes are 2+ on the right side and 2+ on the left side. Psychiatric:        Attention and Perception: Attention normal.        Mood and Affect: Mood normal.        Speech: Speech normal.        Behavior: Behavior normal. Behavior is cooperative.        Thought Content: Thought content normal.    Results for orders placed or performed in visit  on 71/06/26  Basic metabolic panel  Result Value Ref Range   Glucose 106 (H) 70 - 99 mg/dL   BUN 20 8 - 27 mg/dL   Creatinine, Ser 0.89 0.57 - 1.00 mg/dL   eGFR 69 >59 mL/min/1.73   BUN/Creatinine Ratio 22 12 - 28   Sodium 140 134 - 144 mmol/L   Potassium 4.4 3.5 - 5.2 mmol/L   Chloride 104 96 - 106 mmol/L   CO2 21 20 - 29 mmol/L   Calcium 9.6 8.7 - 10.3 mg/dL  Microalbumin, Urine Waived  Result Value Ref Range   Microalb, Ur Waived 30 (H) 0 - 19 mg/L   Creatinine, Urine Waived 100 10 - 300 mg/dL   Microalb/Creat Ratio <30 <30 mg/g  Lipid Panel w/o Chol/HDL Ratio  Result Value Ref Range   Cholesterol, Total 176 100 - 199 mg/dL   Triglycerides 166 (H) 0 - 149 mg/dL   HDL 42 >39 mg/dL   VLDL Cholesterol Cal 29 5 - 40 mg/dL   LDL Chol Calc (NIH) 105 (H) 0 - 99 mg/dL  VITAMIN D 25 Hydroxy (Vit-D Deficiency, Fractures)  Result Value Ref Range   Vit D, 25-Hydroxy 28.4 (L) 30.0 - 100.0 ng/mL  Vitamin B12  Result Value Ref Range   Vitamin B-12 291 232 - 1,245 pg/mL  Bayer DCA Hb A1c Waived  Result Value Ref Range   HB A1C (BAYER DCA - WAIVED) 5.6 4.8 - 5.6 %      Assessment & Plan:   Problem List Items Addressed This Visit       Cardiovascular and Mediastinum   Benign hypertension    Chronic, stable.  BP at goal today.  Continue current diet control. Discussed at length with patient.  Recommend she monitor  BP at least a few mornings a week at home and document for provider.  DASH diet at home.  Labs today: CMP and CBC.         Relevant Orders   Comprehensive metabolic panel   CBC with Differential/Platelet     Digestive   GERD (gastroesophageal reflux disease)    Chronic, ongoing.  Tried stopping Protonix with return of symptoms in past.  Recommend she continue current regimen and adjust as needed.  Mag level annually. Risks of PPI use were discussed with patient including bone loss, C. Diff diarrhea, pneumonia, infections, CKD, electrolyte abnormalities.  Verbalizes  understanding and chooses to continue the medication.         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 Has had MRI, carotid u/s, and multiple labs to rule our tick borne disease and other disease processes, all have been reassuring.  ?fibromyalgia vs other neurologic condition presenting.  Continue Duloxetine and B12 supplement at this time + collaboration with neurology.      Relevant Orders   Vitamin B12   CBC with Differential/Platelet   Bilateral carpal tunnel syndrome    Diagnosed by neurology, she wishes to avoid surgery at this time.  Continue at home treatment.      Essential tremor    Ongoing since childhood, familial, sister has as well.  Tried Propranolol in past with side effects presenting and stopped.  Recent initial neurology note reviewed, will continue this collaboration.        Musculoskeletal and Integument   Osteopenia    Chronic, stable.  Plan for repeat DEXA in 2024.  Check Vit D level and continue supplements at home.      Relevant Orders   VITAMIN D 25 Hydroxy (Vit-D Deficiency, Fractures)     Other   Elevated hemoglobin A1c    Last 5.6% last visit, will recheck today.  Initiate medication as needed based on findings.      Relevant Orders   HgB A1c   Hypercholesterolemia    Chronic, ongoing.  Continue current medication regimen and adjust as needed. Lipid panel and CMP today.        Relevant Orders   Comprehensive metabolic panel   Lipid Panel w/o Chol/HDL Ratio   Major depression    Chronic, ongoing.  Denies SI/HI.  Will continue Cymbalta as is tolerating and mood stable -- ?fibromyalgia vs neuropathy.  She has tolerated Cymbalta before she reports.        Vitamin D deficiency    Chronic, ongoing. Recommend she restart supplement due to osteopenia.  Recheck level today.      Relevant Orders   VITAMIN D 25 Hydroxy (Vit-D Deficiency, Fractures)   Other Visit Diagnoses     Flu vaccine need       Flu vaccine in  office today.        Follow up plan: Return in about 6 months (around 07/18/2022) for HTN/HLD, MOOD, GERD. OSTEOPENIA, NEUROPATHY.

## 2022-01-17 NOTE — Assessment & Plan Note (Signed)
Chronic, ongoing.  Tried stopping Protonix with return of symptoms in past.  Recommend she continue current regimen and adjust as needed.  Mag level annually. Risks of PPI use were discussed with patient including bone loss, C. Diff diarrhea, pneumonia, infections, CKD, electrolyte abnormalities.  Verbalizes understanding and chooses to continue the medication.

## 2022-01-17 NOTE — Assessment & Plan Note (Signed)
Chronic, ongoing. Recommend she restart supplement due to osteopenia.  Recheck level today.

## 2022-01-17 NOTE — Assessment & Plan Note (Signed)
Chronic, stable.  BP at goal today.  Continue current diet control. Discussed at length with patient.  Recommend she monitor BP at least a few mornings a week at home and document for provider.  DASH diet at home.  Labs today: CMP and CBC.

## 2022-01-17 NOTE — Assessment & Plan Note (Signed)
Chronic, ongoing.  Continue current medication regimen and adjust as needed.  Lipid panel and CMP today.    

## 2022-01-17 NOTE — Assessment & Plan Note (Signed)
Chronic, stable.  Plan for repeat DEXA in 2024.  Check Vit D level and continue supplements at home.

## 2022-01-17 NOTE — Assessment & Plan Note (Signed)
Ongoing since childhood, familial, sister has as well.  Tried Propranolol in past with side effects presenting and stopped.  Recent initial neurology note reviewed, will continue this collaboration.

## 2022-01-17 NOTE — Assessment & Plan Note (Signed)
Chronic, ongoing.  Denies SI/HI.  Will continue Cymbalta as is tolerating and mood stable -- ?fibromyalgia vs neuropathy.  She has tolerated Cymbalta before she reports.

## 2022-01-17 NOTE — Assessment & Plan Note (Signed)
Ongoing. Has had MRI, carotid u/s, and multiple labs to rule our tick borne disease and other disease processes, all have been reassuring.  ?fibromyalgia vs other neurologic condition presenting.  Continue Duloxetine and B12 supplement at this time + collaboration with neurology.

## 2022-01-17 NOTE — Assessment & Plan Note (Signed)
Diagnosed by neurology, she wishes to avoid surgery at this time.  Continue at home treatment.

## 2022-01-17 NOTE — Assessment & Plan Note (Signed)
Last 5.6% last visit, will recheck today.  Initiate medication as needed based on findings.

## 2022-01-18 LAB — CBC WITH DIFFERENTIAL/PLATELET
Basophils Absolute: 0 10*3/uL (ref 0.0–0.2)
Basos: 1 %
EOS (ABSOLUTE): 0.1 10*3/uL (ref 0.0–0.4)
Eos: 2 %
Hematocrit: 43 % (ref 34.0–46.6)
Hemoglobin: 14.3 g/dL (ref 11.1–15.9)
Immature Grans (Abs): 0 10*3/uL (ref 0.0–0.1)
Immature Granulocytes: 0 %
Lymphocytes Absolute: 1.7 10*3/uL (ref 0.7–3.1)
Lymphs: 33 %
MCH: 29.2 pg (ref 26.6–33.0)
MCHC: 33.3 g/dL (ref 31.5–35.7)
MCV: 88 fL (ref 79–97)
Monocytes Absolute: 0.3 10*3/uL (ref 0.1–0.9)
Monocytes: 7 %
Neutrophils Absolute: 2.9 10*3/uL (ref 1.4–7.0)
Neutrophils: 57 %
Platelets: 200 10*3/uL (ref 150–450)
RBC: 4.9 x10E6/uL (ref 3.77–5.28)
RDW: 13.3 % (ref 11.7–15.4)
WBC: 5.1 10*3/uL (ref 3.4–10.8)

## 2022-01-18 LAB — VITAMIN B12: Vitamin B-12: 777 pg/mL (ref 232–1245)

## 2022-01-18 LAB — COMPREHENSIVE METABOLIC PANEL
ALT: 15 IU/L (ref 0–32)
AST: 20 IU/L (ref 0–40)
Albumin/Globulin Ratio: 1.8 (ref 1.2–2.2)
Albumin: 4.4 g/dL (ref 3.8–4.8)
Alkaline Phosphatase: 79 IU/L (ref 44–121)
BUN/Creatinine Ratio: 13 (ref 12–28)
BUN: 11 mg/dL (ref 8–27)
Bilirubin Total: 0.3 mg/dL (ref 0.0–1.2)
CO2: 23 mmol/L (ref 20–29)
Calcium: 9.3 mg/dL (ref 8.7–10.3)
Chloride: 104 mmol/L (ref 96–106)
Creatinine, Ser: 0.84 mg/dL (ref 0.57–1.00)
Globulin, Total: 2.5 g/dL (ref 1.5–4.5)
Glucose: 141 mg/dL — ABNORMAL HIGH (ref 70–99)
Potassium: 4.3 mmol/L (ref 3.5–5.2)
Sodium: 141 mmol/L (ref 134–144)
Total Protein: 6.9 g/dL (ref 6.0–8.5)
eGFR: 73 mL/min/{1.73_m2} (ref >59)

## 2022-01-18 LAB — HEMOGLOBIN A1C
Est. average glucose Bld gHb Est-mCnc: 126 mg/dL
Hgb A1c MFr Bld: 6 % — ABNORMAL HIGH (ref 4.8–5.6)

## 2022-01-18 LAB — LIPID PANEL W/O CHOL/HDL RATIO
Cholesterol, Total: 147 mg/dL (ref 100–199)
HDL: 44 mg/dL (ref >39)
LDL Chol Calc (NIH): 81 mg/dL (ref 0–99)
Triglycerides: 126 mg/dL (ref 0–149)
VLDL Cholesterol Cal: 22 mg/dL (ref 5–40)

## 2022-01-18 LAB — VITAMIN D 25 HYDROXY (VIT D DEFICIENCY, FRACTURES): Vit D, 25-Hydroxy: 27 ng/mL — ABNORMAL LOW (ref 30.0–100.0)

## 2022-01-18 NOTE — Progress Notes (Signed)
Good afternoon, please let Tacey know her labs have returned: - Glucose (sugar) remains a little elevated on labs, but A1c remains in prediabetic range at 6%.  Recommend continue diet and exercise focus at home. - Kidney and liver function are all normal. - Vitamin D remains a little low, continue supplement. - Cholesterol labs improved this check, continue Rosuvastatin. - Remainder of labs stable.  Any questions? Keep being amazing!!  Thank you for allowing me to participate in your care.  I appreciate you. Kindest regards, Taher Vannote

## 2022-05-15 DIAGNOSIS — H26491 Other secondary cataract, right eye: Secondary | ICD-10-CM | POA: Diagnosis not present

## 2022-05-15 DIAGNOSIS — Z0100 Encounter for examination of eyes and vision without abnormal findings: Secondary | ICD-10-CM | POA: Diagnosis not present

## 2022-05-15 DIAGNOSIS — H02831 Dermatochalasis of right upper eyelid: Secondary | ICD-10-CM | POA: Diagnosis not present

## 2022-06-06 DIAGNOSIS — H02834 Dermatochalasis of left upper eyelid: Secondary | ICD-10-CM | POA: Diagnosis not present

## 2022-06-06 DIAGNOSIS — H02831 Dermatochalasis of right upper eyelid: Secondary | ICD-10-CM | POA: Diagnosis not present

## 2022-06-06 DIAGNOSIS — H02403 Unspecified ptosis of bilateral eyelids: Secondary | ICD-10-CM | POA: Diagnosis not present

## 2022-06-13 ENCOUNTER — Telehealth: Payer: Self-pay | Admitting: Nurse Practitioner

## 2022-06-13 NOTE — Telephone Encounter (Signed)
Contacted Tabitha Larson to schedule their annual wellness visit. Appointment made for 07/01/2022.  Verlee Rossetti; Care Guide Ambulatory Clinical Support [place] [facility] Direct Dial: [phone]

## 2022-06-20 DIAGNOSIS — H26491 Other secondary cataract, right eye: Secondary | ICD-10-CM | POA: Diagnosis not present

## 2022-06-26 ENCOUNTER — Other Ambulatory Visit: Payer: Self-pay | Admitting: Nurse Practitioner

## 2022-06-26 NOTE — Telephone Encounter (Signed)
Unable to refill per protocol, Rx request is too soon. Last refill  05/08/21 for 90 and 4 refills.  Requested Prescriptions  Pending Prescriptions Disp Refills   rosuvastatin (CRESTOR) 40 MG tablet [Pharmacy Med Name: Rosuvastatin Calcium 40 MG Oral Tablet] 90 tablet 0    Sig: Take 1 tablet by mouth once daily     Cardiovascular:  Antilipid - Statins 2 Failed - 06/26/2022  9:40 AM      Failed - Lipid Panel in normal range within the last 12 months    Cholesterol, Total  Date Value Ref Range Status  01/17/2022 147 100 - 199 mg/dL Final   Cholesterol Piccolo, Waived  Date Value Ref Range Status  01/24/2015 185 <200 mg/dL Final    Comment:                            Desirable                <200                         Borderline High      200- 239                         High                     >239    LDL Chol Calc (NIH)  Date Value Ref Range Status  01/17/2022 81 0 - 99 mg/dL Final   HDL  Date Value Ref Range Status  01/17/2022 44 >39 mg/dL Final   Triglycerides  Date Value Ref Range Status  01/17/2022 126 0 - 149 mg/dL Final   Triglycerides Piccolo,Waived  Date Value Ref Range Status  01/24/2015 133 <150 mg/dL Final    Comment:                            Normal                   <150                         Borderline High     150 - 199                         High                200 - 499                         Very High                >499          Passed - Cr in normal range and within 360 days    Creatinine, Ser  Date Value Ref Range Status  01/17/2022 0.84 0.57 - 1.00 mg/dL Final         Passed - Patient is not pregnant      Passed - Valid encounter within last 12 months    Recent Outpatient Visits           5 months ago B12 neuropathy (HCC)   [place] [facility] [place], Jolene T, NP   10 months ago B12 neuropathy (HCC)   [facility] [place], [place] T, NP   1 year ago Severe episode of recurrent major  depressive disorder, without psychotic features (HCC)   [place] [facility] [place], Dorie Rank, NP   1 year ago Dizziness   [place] [facility] [place], Jake Church, NP   1 year ago Benign hypertension   [place] [facility] [place], Dorie Rank, NP       Future Appointments             In 3 weeks Cannady, Dorie Rank, NP [place] [facility], [facility]

## 2022-07-09 ENCOUNTER — Other Ambulatory Visit: Payer: Self-pay | Admitting: Nurse Practitioner

## 2022-07-09 NOTE — Telephone Encounter (Signed)
Requested Prescriptions  Pending Prescriptions Disp Refills   rosuvastatin (CRESTOR) 40 MG tablet [Pharmacy Med Name: Rosuvastatin Calcium 40 MG Oral Tablet] 90 tablet 1    Sig: Take 1 tablet by mouth once daily     Cardiovascular:  Antilipid - Statins 2 Failed - 07/09/2022  8:56 AM      Failed - Lipid Panel in normal range within the last 12 months    Cholesterol, Total  Date Value Ref Range Status  01/17/2022 147 100 - 199 mg/dL Final   Cholesterol Piccolo, Waived  Date Value Ref Range Status  01/24/2015 185 <200 mg/dL Final    Comment:                            Desirable                <200                         Borderline High      200- 239                         High                     >239    LDL Chol Calc (NIH)  Date Value Ref Range Status  01/17/2022 81 0 - 99 mg/dL Final   HDL  Date Value Ref Range Status  01/17/2022 44 >39 mg/dL Final   Triglycerides  Date Value Ref Range Status  01/17/2022 126 0 - 149 mg/dL Final   Triglycerides Piccolo,Waived  Date Value Ref Range Status  01/24/2015 133 <150 mg/dL Final    Comment:                            Normal                   <150                         Borderline High     150 - 199                         High                200 - 499                         Very High                >499          Passed - Cr in normal range and within 360 days    Creatinine, Ser  Date Value Ref Range Status  01/17/2022 0.84 0.57 - 1.00 mg/dL Final         Passed - Patient is not pregnant      Passed - Valid encounter within last 12 months    Recent Outpatient Visits           5 months ago B12 neuropathy (HCC)   [place] [facility] [place], Corrie Dandy T, NP   10 months ago B12 neuropathy (HCC)   [place] [facility] [place], [place] T, NP   1 year ago Severe episode of recurrent major depressive disorder,  without psychotic features [facility])   [place] [facility] [place],  Corrie Dandy T, NP   1 year ago Dizziness   [place] [facility] [place], Jake Church, NP   1 year ago Benign hypertension   [place] [facility] [place], Dorie Rank, NP       Future Appointments             In 1 week Cannady, Dorie Rank, NP [place] [facility], [facility]

## 2022-07-09 NOTE — Anesthesia Preprocedure Evaluation (Addendum)
Anesthesia Evaluation  Patient identified by MRN, date of birth, ID band Patient awake    Reviewed: Allergy & Precautions, H&P , NPO status , Patient's Chart, lab work & pertinent test results  Airway Mallampati: III  TM Distance: >3 FB Neck ROM: Full    Dental  (+) Upper Dentures   Pulmonary neg pulmonary ROS, former smoker   Pulmonary exam normal breath sounds clear to auscultation       Cardiovascular hypertension, + CAD  negative cardio ROS Normal cardiovascular exam Rhythm:Regular Rate:Normal  Moderate atherosclerotic plaque in carotid arteries per ultrasound    Neuro/Psych  Headaches PSYCHIATRIC DISORDERS Anxiety Depression     Neuromuscular disease negative neurological ROS  negative psych ROS   GI/Hepatic negative GI ROS, Neg liver ROS,GERD  ,,  Endo/Other  negative endocrine ROS    Renal/GU negative Renal ROS  negative genitourinary   Musculoskeletal negative musculoskeletal ROS (+)    Abdominal   Peds negative pediatric ROS (+)  Hematology negative hematology ROS (+)   Anesthesia Other Findings Hypertension  Hyperlipidemia Depression  Osteoporosis Migraine  Anxiety Ankle fracture PONV (postoperative nausea and vomiting) Wears dentures  GERD (gastroesophageal reflux disease)    Reproductive/Obstetrics negative OB ROS                             Anesthesia Physical Anesthesia Plan  ASA: 2  Anesthesia Plan:    Post-op Pain Management:    Induction:   PONV Risk Score and Plan:   Airway Management Planned:   Additional Equipment:   Intra-op Plan:   Post-operative Plan:   Informed Consent:   Plan Discussed with:   Anesthesia Plan Comments:        Anesthesia Quick Evaluation

## 2022-07-11 ENCOUNTER — Encounter: Payer: Self-pay | Admitting: Ophthalmology

## 2022-07-14 NOTE — Patient Instructions (Incomplete)
Please call to schedule your mammogram and/or bone density: [facility] at [facility]  Address: [address], [place], [place] [address] Phone: [phone]  [facility] [place] [address] [place],  [place]  [address] Phone: [phone]    Be Involved in Your Health Care:  Taking Medications When medications are taken as directed, they can greatly improve your health. But if they are not taken as instructed, they may not work. In some cases, not taking them correctly can be harmful. To help ensure your treatment remains effective and safe, understand your medications and how to take them.  Your lab results, notes and after visit summary will be available on My Chart. We strongly encourage you to use this feature. If lab results are abnormal the clinic will contact you with the appropriate steps. If the clinic does not contact you assume the results are satisfactory. You can always see your results on My Chart. If you have questions regarding your condition, please contact the clinic during office hours. You can also ask questions on My Chart.  We at [facility] are grateful that you chose us to provide care. We strive to provide excellent and compassionate care and are always looking for feedback. If you get a survey from the clinic please complete this.    DASH Eating Plan DASH stands for Dietary Approaches to Stop Hypertension. The DASH eating plan is a healthy eating plan that has been shown to: Reduce high blood pressure (hypertension). Reduce your risk for type 2 diabetes, heart disease, and stroke. Help with weight loss. What are tips for following this plan? Reading food labels Check food labels for the amount of salt (sodium) per serving. Choose foods with less than 5 percent of the Daily Value of sodium. Generally, foods with less than 300 milligrams (mg) of sodium per serving fit into this eating plan. To  find whole grains, look for the word "whole" as the first word in the ingredient list. Shopping Buy products labeled as "low-sodium" or "no salt added." Buy fresh foods. Avoid canned foods and pre-made or frozen meals. Cooking Avoid adding salt when cooking. Use salt-free seasonings or herbs instead of table salt or sea salt. Check with your health care provider or pharmacist before using salt substitutes. Do not fry foods. Cook foods using healthy methods such as baking, boiling, grilling, roasting, and broiling instead. Cook with heart-healthy oils, such as olive, canola, avocado, soybean, or sunflower oil. Meal planning  Eat a balanced diet that includes: 4 or more servings of fruits and 4 or more servings of vegetables each day. Try to fill one-half of your plate with fruits and vegetables. 6-8 servings of whole grains each day. Less than 6 oz (170 g) of lean meat, poultry, or fish each day. A 3-oz (85-g) serving of meat is about the same size as a deck of cards. One egg equals 1 oz (28 g). 2-3 servings of low-fat dairy each day. One serving is 1 cup (237 mL). 1 serving of nuts, seeds, or beans 5 times each week. 2-3 servings of heart-healthy fats. Healthy fats called omega-3 fatty acids are found in foods such as walnuts, flaxseeds, fortified milks, and eggs. These fats are also found in cold-water fish, such as sardines, salmon, and mackerel. Limit how much you eat of: Canned or prepackaged foods. Food that is high in trans fat, such as some fried foods. Food that is high in saturated fat,   such as fatty meat. Desserts and other sweets, sugary drinks, and other foods with added sugar. Full-fat dairy products. Do not salt foods before eating. Do not eat more than 4 egg yolks a week. Try to eat at least 2 vegetarian meals a week. Eat more home-cooked food and less restaurant, buffet, and fast food. Lifestyle When eating at a restaurant, ask that your food be prepared with less salt or  no salt, if possible. If you drink alcohol: Limit how much you use to: 0-1 drink a day for women who are not pregnant. 0-2 drinks a day for men. Be aware of how much alcohol is in your drink. In the [place] one drink equals one 12 oz bottle of beer (355 mL), one 5 oz glass of wine (148 mL), or one 1 oz glass of hard liquor (44 mL). General information Avoid eating more than 2,300 mg of salt a day. If you have hypertension, you may need to reduce your sodium intake to 1,500 mg a day. Work with your health care provider to maintain a healthy body weight or to lose weight. Ask what an ideal weight is for you. Get at least 30 minutes of exercise that causes your heart to beat faster (aerobic exercise) most days of the week. Activities may include walking, swimming, or biking. Work with your health care provider or dietitian to adjust your eating plan to your individual calorie needs. What foods should I eat? Fruits All fresh, dried, or frozen fruit. Canned fruit in natural juice (without added sugar). Vegetables Fresh or frozen vegetables (raw, steamed, roasted, or grilled). Low-sodium or reduced-sodium tomato and vegetable juice. Low-sodium or reduced-sodium tomato sauce and tomato paste. Low-sodium or reduced-sodium canned vegetables. Grains Whole-grain or whole-wheat bread. Whole-grain or whole-wheat pasta. Brown rice. Oatmeal. Quinoa. Bulgur. Whole-grain and low-sodium cereals. Pita bread. Low-fat, low-sodium crackers. Whole-wheat flour tortillas. Meats and other proteins Skinless chicken or turkey. Ground chicken or turkey. Pork with fat trimmed off. Fish and seafood. Egg whites. Dried beans, peas, or lentils. Unsalted nuts, nut butters, and seeds. Unsalted canned beans. Lean cuts of beef with fat trimmed off. Low-sodium, lean precooked or cured meat, such as sausages or meat loaves. Dairy Low-fat (1%) or fat-free (skim) milk. Reduced-fat, low-fat, or fat-free cheeses. Nonfat, low-sodium ricotta  or cottage cheese. Low-fat or nonfat yogurt. Low-fat, low-sodium cheese. Fats and oils Soft margarine without trans fats. Vegetable oil. Reduced-fat, low-fat, or light mayonnaise and salad dressings (reduced-sodium). Canola, safflower, olive, avocado, soybean, and sunflower oils. Avocado. Seasonings and condiments Herbs. Spices. Seasoning mixes without salt. Other foods Unsalted popcorn and pretzels. Fat-free sweets. The items listed above may not be a complete list of foods and beverages you can eat. Contact a dietitian for more information. What foods should I avoid? Fruits Canned fruit in a light or heavy syrup. Fried fruit. Fruit in cream or butter sauce. Vegetables Creamed or fried vegetables. Vegetables in a cheese sauce. Regular canned vegetables (not low-sodium or reduced-sodium). Regular canned tomato sauce and paste (not low-sodium or reduced-sodium). Regular tomato and vegetable juice (not low-sodium or reduced-sodium). Pickles. Olives. Grains Baked goods made with fat, such as croissants, muffins, or some breads. Dry pasta or rice meal packs. Meats and other proteins Fatty cuts of meat. Ribs. Fried meat. Bacon. Bologna, salami, and other precooked or cured meats, such as sausages or meat loaves. Fat from the back of a pig (fatback). Bratwurst. Salted nuts and seeds. Canned beans with added salt. Canned or smoked fish. Whole eggs or   egg yolks. Chicken or turkey with skin. Dairy Whole or 2% milk, cream, and half-and-half. Whole or full-fat cream cheese. Whole-fat or sweetened yogurt. Full-fat cheese. Nondairy creamers. Whipped toppings. Processed cheese and cheese spreads. Fats and oils Butter. Stick margarine. Lard. Shortening. Ghee. Bacon fat. Tropical oils, such as coconut, palm kernel, or palm oil. Seasonings and condiments Onion salt, garlic salt, seasoned salt, table salt, and sea salt. Worcestershire sauce. Tartar sauce. Barbecue sauce. Teriyaki sauce. Soy sauce, including  reduced-sodium. Steak sauce. Canned and packaged gravies. Fish sauce. Oyster sauce. Cocktail sauce. Store-bought horseradish. Ketchup. Mustard. Meat flavorings and tenderizers. Bouillon cubes. Hot sauces. Pre-made or packaged marinades. Pre-made or packaged taco seasonings. Relishes. Regular salad dressings. Other foods Salted popcorn and pretzels. The items listed above may not be a complete list of foods and beverages you should avoid. Contact a dietitian for more information. Where to find more information National Heart, Lung, and Blood Institute: www.nhlbi.nih.gov American Heart Association: www.heart.org Academy of Nutrition and Dietetics: www.eatright.org National Kidney Foundation: www.kidney.org Summary The DASH eating plan is a healthy eating plan that has been shown to reduce high blood pressure (hypertension). It may also reduce your risk for type 2 diabetes, heart disease, and stroke. When on the DASH eating plan, aim to eat more fresh fruits and vegetables, whole grains, lean proteins, low-fat dairy, and heart-healthy fats. With the DASH eating plan, you should limit salt (sodium) intake to 2,300 mg a day. If you have hypertension, you may need to reduce your sodium intake to 1,500 mg a day. Work with your health care provider or dietitian to adjust your eating plan to your individual calorie needs. This information is not intended to replace advice given to you by your health care provider. Make sure you discuss any questions you have with your health care provider. Document Revised: 01/22/2019 Document Reviewed: 01/22/2019 Elsevier Patient Education  2023 Elsevier Inc.  

## 2022-07-17 ENCOUNTER — Telehealth: Payer: Self-pay | Admitting: Nurse Practitioner

## 2022-07-17 NOTE — Discharge Instructions (Signed)

## 2022-07-17 NOTE — Telephone Encounter (Signed)
Contacted [name] to schedule their annual wellness visit. Appointment made for 07/23/2022.  Verlee Rossetti; Care Guide Ambulatory Clinical Support [place] [facility] Direct Dial: [phone] *

## 2022-07-18 ENCOUNTER — Ambulatory Visit: Payer: Medicare PPO | Admitting: Nurse Practitioner

## 2022-07-18 ENCOUNTER — Telehealth: Payer: Self-pay | Admitting: Nurse Practitioner

## 2022-07-18 ENCOUNTER — Encounter: Payer: Self-pay | Admitting: Nurse Practitioner

## 2022-07-18 VITALS — BP 126/70 | HR 61 | Temp 97.7°F | Wt 143.4 lb

## 2022-07-18 DIAGNOSIS — N644 Mastodynia: Secondary | ICD-10-CM

## 2022-07-18 DIAGNOSIS — R079 Chest pain, unspecified: Secondary | ICD-10-CM | POA: Insufficient documentation

## 2022-07-18 DIAGNOSIS — K219 Gastro-esophageal reflux disease without esophagitis: Secondary | ICD-10-CM

## 2022-07-18 DIAGNOSIS — M85851 Other specified disorders of bone density and structure, right thigh: Secondary | ICD-10-CM | POA: Diagnosis not present

## 2022-07-18 DIAGNOSIS — E538 Deficiency of other specified B group vitamins: Secondary | ICD-10-CM | POA: Diagnosis not present

## 2022-07-18 DIAGNOSIS — F332 Major depressive disorder, recurrent severe without psychotic features: Secondary | ICD-10-CM

## 2022-07-18 DIAGNOSIS — G250 Essential tremor: Secondary | ICD-10-CM

## 2022-07-18 DIAGNOSIS — I10 Essential (primary) hypertension: Secondary | ICD-10-CM | POA: Diagnosis not present

## 2022-07-18 DIAGNOSIS — E559 Vitamin D deficiency, unspecified: Secondary | ICD-10-CM | POA: Diagnosis not present

## 2022-07-18 DIAGNOSIS — Z23 Encounter for immunization: Secondary | ICD-10-CM

## 2022-07-18 DIAGNOSIS — E7800 Pure hypercholesterolemia, unspecified: Secondary | ICD-10-CM | POA: Diagnosis not present

## 2022-07-18 DIAGNOSIS — Z1231 Encounter for screening mammogram for malignant neoplasm of breast: Secondary | ICD-10-CM

## 2022-07-18 DIAGNOSIS — R7309 Other abnormal glucose: Secondary | ICD-10-CM | POA: Diagnosis not present

## 2022-07-18 DIAGNOSIS — R0989 Other specified symptoms and signs involving the circulatory and respiratory systems: Secondary | ICD-10-CM

## 2022-07-18 DIAGNOSIS — G63 Polyneuropathy in diseases classified elsewhere: Secondary | ICD-10-CM | POA: Diagnosis not present

## 2022-07-18 MED ORDER — DULOXETINE HCL 60 MG PO CPEP
60.0000 mg | ORAL_CAPSULE | Freq: Every day | ORAL | 4 refills | Status: DC
Start: 2022-07-18 — End: 2023-08-26

## 2022-07-18 NOTE — Assessment & Plan Note (Signed)
Chronic, ongoing.  Continue current medication regimen and adjust as needed.  Lipid panel and CMP today.    

## 2022-07-18 NOTE — Progress Notes (Addendum)
BP 126/70 (BP Location: Left Arm, Patient Position: Sitting, Cuff Size: Normal)   Pulse 61   Temp 97.7 F (36.5 C) (Oral)   Wt 143 lb 6.4 oz (65 kg)   LMP  (LMP Unknown)   SpO2 96%   BMI 26.23 kg/m    Subjective:    Patient ID: Tabitha Larson, female    DOB: Apr 14, 1950, 72 y.o.   MRN: [id number]  HPI: Tabitha Larson is a 72 y.o. female  Chief Complaint  Patient presents with   Gastroesophageal Reflux   Depression   Hyperlipidemia   Hypertension   Osteopenia   HYPERTENSION / HYPERLIPIDEMIA No current blood pressure medications.  Continues Rosuvastatin. Satisfied with current treatment? yes Duration of hypertension: chronic BP monitoring frequency: not checking BP range:  BP medication side effects: no Duration of hyperlipidemia: chronic Cholesterol medication side effects: no Cholesterol supplements: none Medication compliance: good compliance Aspirin: no Recent stressors: no Recurrent headaches: no Visual changes: no Palpitations: no Dyspnea: no Chest pain: started a few days ago under left breast area -- comes and goes.  5/10, lasts for a few minutes and goes away on own.  Denies any palpitations, SOB, diaphoresis, N&V, dizziness, or radiation with this.  No worsening. Notices more after eating.  Episodes a few times a day. Lower extremity edema: no Dizzy/lightheaded: no  The 10-year ASCVD risk score (Arnett DK, et al., 2019) is: 12.2%   Values used to calculate the score:     Age: 20 years     Sex: Female     Is Non-Hispanic African American: No     Diabetic: No     Tobacco smoker: No     Systolic Blood Pressure: 126 mmHg     Is BP treated: No     HDL Cholesterol: 44 mg/dL     Total Cholesterol: 147 mg/dL  Z30 NEUROPATHY Followed by neurology, last visit was 12/07/21.  Has bilateral carpal tunnel and B12 deficiency.  Carotid u/s 03/22/20 with no stenosis, but plaque present bilaterally.  A1c 6% most recent visit.  She reports neuropathy pain is  improving. Neuropathy status: fluctuates Satisfied with current treatment?: yes Medication side effects: no Location: feet and hands -- not as bad Pain: occasional Severity: 2/10 Quality:  tingling and pins and needles Frequency: intermittent Bilateral: yes Symmetric: yes Numbness: yes Decreased sensation: yes Weakness: yes, especially in morning Context: fluctuating Alleviating factors: nothing Aggravating factors: nothing Treatments attempted: B12 supplement  GERD Continues Protonix, has tried cut back and return of symptoms presented. GERD control status: stable  Satisfied with current treatment? yes Heartburn frequency: every day without medicine, but with medication none Medication side effects: no  Medication compliance: stable Previous GERD medications: TUMS Antacid use frequency: every day Dysphagia: no Odynophagia:  no Hematemesis: no Blood in stool: no EGD: October 2020, yes  OSTEOPENIA Last DEXA = T-score of -1.9 in 2019.  Taking supplements. Satisfied with current treatment?: yes Adequate calcium & vitamin D: no, she forgot Weight bearing exercises: yes   DEPRESSION Continues on Duloxetine 60 MG daily.  Reports having more good days then bad days. Mood status: stable Satisfied with current treatment?: yes Symptom severity: mild  Duration of current treatment : chronic Side effects: no Medication compliance: good compliance Psychotherapy/counseling: none Depressed mood: occasional Anxious mood: occasional Anhedonia: no Significant weight loss or gain: no Insomnia: none Fatigue: yes Feelings of worthlessness or guilt: yes Impaired concentration/indecisiveness: no Suicidal ideations: no Hopelessness: no Crying spells: no  07/18/2022    9:48 AM 01/17/2022   10:42 AM 08/17/2021    1:05 PM 06/25/2021    9:07 AM 05/07/2021    9:16 AM  Depression screen PHQ 2/9  Decreased Interest 1 1 1 1  0  Down, Depressed, Hopeless 1 1 2 1 2   PHQ - 2 Score 2 2  3 2 2   Altered sleeping 1 1 1 1  0  Tired, decreased energy 2 1 1 1 3   Change in appetite 3 3 3  0 3  Feeling bad or failure about yourself  2 2 1  0 1  Trouble concentrating 1 2 1 1 1   Moving slowly or fidgety/restless 0 0 0 0 0  Suicidal thoughts 0 0 0 0 0  PHQ-9 Score 11 11 10 5 10   Difficult doing work/chores Somewhat difficult Somewhat difficult  Not difficult at all Somewhat difficult      07/18/2022    9:48 AM 01/17/2022   10:43 AM 08/17/2021    1:06 PM 03/02/2021   10:04 AM  GAD 7 : Generalized Anxiety Score  Nervous, Anxious, on Edge 2 1 1 2   Control/stop worrying 2 2 1 2   Worry too much - different things 2 2 1 2   Trouble relaxing 1 2 1 2   Restless 1 1 1 2   Easily annoyed or irritable 1 1 1 2   Afraid - awful might happen 2 2 1 2   Total GAD 7 Score 11 11 7 14   Anxiety Difficulty Somewhat difficult Somewhat difficult Somewhat difficult Not difficult at all    Relevant past medical, surgical, family and social history reviewed and updated as indicated. Interim medical history since our last visit reviewed. Allergies and medications reviewed and updated.  Review of Systems  Constitutional:  Positive for fatigue. Negative for activity change, appetite change, diaphoresis and fever.  Respiratory:  Negative for cough, chest tightness and shortness of breath.   Cardiovascular:  Positive for chest pain. Negative for palpitations and leg swelling.  Gastrointestinal: Negative.   Endocrine: Negative for cold intolerance and heat intolerance.  Neurological: Negative.   Psychiatric/Behavioral:  Negative for decreased concentration, self-injury, sleep disturbance and suicidal ideas. The patient is nervous/anxious.     Per HPI unless specifically indicated above     Objective:    BP 126/70 (BP Location: Left Arm, Patient Position: Sitting, Cuff Size: Normal)   Pulse 61   Temp 97.7 F (36.5 C) (Oral)   Wt 143 lb 6.4 oz (65 kg)   LMP  (LMP Unknown)   SpO2 96%   BMI 26.23  kg/m   Wt Readings from Last 3 Encounters:  07/18/22 143 lb 6.4 oz (65 kg)  01/17/22 141 lb 9.6 oz (64.2 kg)  08/17/21 141 lb (64 kg)    Physical Exam Vitals and nursing note reviewed.  Constitutional:      General: She is awake. She is not in acute distress.    Appearance: She is well-developed and well-groomed. She is not ill-appearing or toxic-appearing.  HENT:     Head: Normocephalic.     Right Ear: Hearing normal.     Left Ear: Hearing normal.  Eyes:     General: Lids are normal.        Right eye: No discharge.        Left eye: No discharge.     Conjunctiva/sclera: Conjunctivae normal.     Pupils: Pupils are equal, round, and reactive to light.  Neck:     Thyroid: No thyromegaly.  Vascular: Carotid bruit (right, soft) present.  Cardiovascular:     Rate and Rhythm: Normal rate and regular rhythm.     Heart sounds: Normal heart sounds. No murmur heard.    No gallop.  Pulmonary:     Effort: Pulmonary effort is normal. No accessory muscle usage or respiratory distress.     Breath sounds: Normal breath sounds.  Abdominal:     General: Bowel sounds are normal.     Palpations: Abdomen is soft.  Musculoskeletal:     Cervical back: Normal range of motion and neck supple.     Right lower leg: No edema.     Left lower leg: No edema.  Lymphadenopathy:     Cervical: No cervical adenopathy.  Skin:    General: Skin is warm and dry.  Neurological:     Mental Status: She is alert and oriented to person, place, and time.     Cranial Nerves: Cranial nerves 2-12 are intact.     Motor: Tremor (bilateral hands, mild at rest) present. No weakness.     Coordination: Coordination is intact.     Gait: Gait is intact.     Deep Tendon Reflexes: Reflexes are normal and symmetric.     Reflex Scores:      Brachioradialis reflexes are 2+ on the right side and 2+ on the left side.      Patellar reflexes are 2+ on the right side and 2+ on the left side. Psychiatric:        Attention and  Perception: Attention normal.        Mood and Affect: Mood normal.        Speech: Speech normal.        Behavior: Behavior normal. Behavior is cooperative.        Thought Content: Thought content normal.   EKG My review and personal interpretation at Time: 1030   Indication: chest pain  Rate: 64  Rhythm: sinus Axis: left Other: No nonspecific st abn, no stemi, no lvh  Results for orders placed or performed in visit on 01/17/22  HgB A1c  Result Value Ref Range   Hgb A1c MFr Bld 6.0 (H) 4.8 - 5.6 %   Est. average glucose Bld gHb Est-mCnc 126 mg/dL  Comprehensive metabolic panel  Result Value Ref Range   Glucose 141 (H) 70 - 99 mg/dL   BUN 11 8 - 27 mg/dL   Creatinine, Ser 8.11 0.57 - 1.00 mg/dL   eGFR 73 >91 YN/WGN/5.62   BUN/Creatinine Ratio 13 12 - 28   Sodium 141 134 - 144 mmol/L   Potassium 4.3 3.5 - 5.2 mmol/L   Chloride 104 96 - 106 mmol/L   CO2 23 20 - 29 mmol/L   Calcium 9.3 8.7 - 10.3 mg/dL   Total Protein 6.9 6.0 - 8.5 g/dL   Albumin 4.4 3.8 - 4.8 g/dL   Globulin, Total 2.5 1.5 - 4.5 g/dL   Albumin/Globulin Ratio 1.8 1.2 - 2.2   Bilirubin Total 0.3 0.0 - 1.2 mg/dL   Alkaline Phosphatase 79 44 - 121 IU/L   AST 20 0 - 40 IU/L   ALT 15 0 - 32 IU/L  Lipid Panel w/o Chol/HDL Ratio  Result Value Ref Range   Cholesterol, Total 147 100 - 199 mg/dL   Triglycerides 130 0 - 149 mg/dL   HDL 44 >86 mg/dL   VLDL Cholesterol Cal 22 5 - 40 mg/dL   LDL Chol Calc (NIH) 81 0 - 99 mg/dL  VITAMIN D 25 Hydroxy (Vit-D Deficiency, Fractures)  Result Value Ref Range   Vit D, 25-Hydroxy 27.0 (L) 30.0 - 100.0 ng/mL  Vitamin B12  Result Value Ref Range   Vitamin B-12 777 232 - 1,245 pg/mL  CBC with Differential/Platelet  Result Value Ref Range   WBC 5.1 3.4 - 10.8 x10E3/uL   RBC 4.90 3.77 - 5.28 x10E6/uL   Hemoglobin 14.3 11.1 - 15.9 g/dL   Hematocrit 11.9 14.7 - 46.6 %   MCV 88 79 - 97 fL   MCH 29.2 26.6 - 33.0 pg   MCHC 33.3 31.5 - 35.7 g/dL   RDW 82.9 56.2 - 13.0 %    Platelets 200 150 - 450 x10E3/uL   Neutrophils 57 Not Estab. %   Lymphs 33 Not Estab. %   Monocytes 7 Not Estab. %   Eos 2 Not Estab. %   Basos 1 Not Estab. %   Neutrophils Absolute 2.9 1.4 - 7.0 x10E3/uL   Lymphocytes Absolute 1.7 0.7 - 3.1 x10E3/uL   Monocytes Absolute 0.3 0.1 - 0.9 x10E3/uL   EOS (ABSOLUTE) 0.1 0.0 - 0.4 x10E3/uL   Basophils Absolute 0.0 0.0 - 0.2 x10E3/uL   Immature Granulocytes 0 Not Estab. %   Immature Grans (Abs) 0.0 0.0 - 0.1 x10E3/uL      Assessment & Plan:   Problem List Items Addressed This Visit       Cardiovascular and Mediastinum   Benign hypertension    Chronic, stable.  BP at goal today.  Continue current diet control. Discussed at length with patient.  Recommend she monitor BP at least a few mornings a week at home and document for provider.  DASH diet at home.  Labs today: CMP.         Relevant Orders   Comprehensive metabolic panel     Digestive   GERD (gastroesophageal reflux disease)    Chronic, ongoing.  Tried stopping Protonix with return of symptoms in past.  Recommend she continue current regimen and adjust as needed.  Mag level annually - due next visit. Risks of PPI use were discussed with patient including bone loss, C. Diff diarrhea, pneumonia, infections, CKD, electrolyte abnormalities.  Verbalizes understanding and chooses to continue the medication.       Relevant Orders   Magnesium     Nervous and Auditory   B12 neuropathy (HCC)    Ongoing. Has had MRI, carotid u/s, and multiple labs to rule our tick borne disease and other disease processes, all have been reassuring.  ?fibromyalgia vs other neurologic condition presenting.  Continue Duloxetine and B12 supplement at this time + collaboration with neurology.  Overall symptoms improved on current regimen. Recheck labs today.      Relevant Medications   DULoxetine (CYMBALTA) 60 MG capsule   Other Relevant Orders   Vitamin B12   Essential tremor    Ongoing since childhood,  familial, sister has as well.  Tried Propranolol in past with side effects presenting and stopped.  Continue collaboration with neurology.        Musculoskeletal and Integument   Osteopenia    Chronic, stable.  Plan for repeat DEXA in 2024, ordered today and recommend she schedule.  Check Vit D level and continue supplements at home.      Relevant Orders   VITAMIN D 25 Hydroxy (Vit-D Deficiency, Fractures)   DG Bone Density     Other   Chest pain    Acute for 3 days intermittent under her left breast.  EKG reassuring today, similar to previous.  No red flag symptoms presenting with pain.  At this time she wishes to defer referral to cardiology, would like to monitor symptoms and if worsening she will alert PCP.  This is appropriate at this time.  Will check labs today and have her return in 5 weeks for recheck.  She is aware if any worsening to alert PCP immediately and we discussed red flag symptoms to immediately go to ER for.  ?GERD related.      Relevant Orders   EKG 12-Lead (Completed)   Elevated hemoglobin A1c    Last 6% November, will recheck today.  Initiate medication as needed based on findings.      Relevant Orders   HgB A1c   Hypercholesterolemia    Chronic, ongoing.  Continue current medication regimen and adjust as needed. Lipid panel and CMP today.        Relevant Orders   Comprehensive metabolic panel   Lipid Panel w/o Chol/HDL Ratio   Major depression - Primary    Chronic, ongoing.  Denies SI/HI.  Will continue Cymbalta as is tolerating and mood stable -- ?fibromyalgia vs neuropathy.  Noticing improvement in discomfort with Cymbalta on board.      Relevant Medications   DULoxetine (CYMBALTA) 60 MG capsule   Right carotid bruit    Imaging last = 03/22/20 imaging = Moderate amount of bilateral atherosclerotic plaque, not resulting in a hemodynamically significant stenosis.  No current symptoms, will monitor and may repeat imaging in 1-3 years to ensure no  worsening plaque.  Continue statin therapy.      Vitamin D deficiency    Chronic, ongoing. Recommend she continue supplement due to osteopenia.  Recheck level today.       Relevant Orders   VITAMIN D 25 Hydroxy (Vit-D Deficiency, Fractures)   Other Visit Diagnoses     Encounter for screening mammogram for malignant neoplasm of breast       Mammogram ordered.  Instructed on where to call to schedule.   Need for Td vaccine       Td vaccine in office today, discussed with patient.   Relevant Orders   Td vaccine greater than or equal to 7yo preservative free IM (Completed)        Follow up plan: Return in about 6 weeks (around 08/26/2022) for CHEST PAIN.

## 2022-07-18 NOTE — Assessment & Plan Note (Signed)
Chronic, ongoing. Recommend she continue supplement due to osteopenia.  Recheck level today.

## 2022-07-18 NOTE — Assessment & Plan Note (Signed)
Chronic, stable.  Plan for repeat DEXA in 2024, ordered today and recommend she schedule.  Check Vit D level and continue supplements at home.

## 2022-07-18 NOTE — Telephone Encounter (Signed)
Patient came into office. Stated she called to schedule her mammogram, but [facility] told her since she was experiencing left breast pain that she needed a Bi lateral diagnosis code and Left breast ultrasound order from provider Aura Dials, NP before they can schedule her appt. Patient asking to be notified when she can schedule her appt.

## 2022-07-18 NOTE — Assessment & Plan Note (Signed)
Chronic, stable.  BP at goal today.  Continue current diet control. Discussed at length with patient.  Recommend she monitor BP at least a few mornings a week at home and document for provider.  DASH diet at home.  Labs today: CMP.

## 2022-07-18 NOTE — Assessment & Plan Note (Signed)
Imaging last = 03/22/20 imaging = Moderate amount of bilateral atherosclerotic plaque, not resulting in a hemodynamically significant stenosis.  No current symptoms, will monitor and may repeat imaging in 1-3 years to ensure no worsening plaque.  Continue statin therapy.

## 2022-07-18 NOTE — Assessment & Plan Note (Signed)
Acute for 3 days intermittent under her left breast.  EKG reassuring today, similar to previous.  No red flag symptoms presenting with pain.  At this time she wishes to defer referral to cardiology, would like to monitor symptoms and if worsening she will alert PCP.  This is appropriate at this time.  Will check labs today and have her return in 5 weeks for recheck.  She is aware if any worsening to alert PCP immediately and we discussed red flag symptoms to immediately go to ER for.  ?GERD related.

## 2022-07-18 NOTE — Assessment & Plan Note (Signed)
Chronic, ongoing.  Tried stopping Protonix with return of symptoms in past.  Recommend she continue current regimen and adjust as needed.  Mag level annually - due next visit. Risks of PPI use were discussed with patient including bone loss, C. Diff diarrhea, pneumonia, infections, CKD, electrolyte abnormalities.  Verbalizes understanding and chooses to continue the medication.

## 2022-07-18 NOTE — Assessment & Plan Note (Signed)
Ongoing. Has had MRI, carotid u/s, and multiple labs to rule our tick borne disease and other disease processes, all have been reassuring.  ?fibromyalgia vs other neurologic condition presenting.  Continue Duloxetine and B12 supplement at this time + collaboration with neurology.  Overall symptoms improved on current regimen. Recheck labs today.

## 2022-07-18 NOTE — Assessment & Plan Note (Signed)
Last 6% November, will recheck today.  Initiate medication as needed based on findings.

## 2022-07-18 NOTE — Assessment & Plan Note (Signed)
Chronic, ongoing.  Denies SI/HI.  Will continue Cymbalta as is tolerating and mood stable -- ?fibromyalgia vs neuropathy.  Noticing improvement in discomfort with Cymbalta on board.

## 2022-07-18 NOTE — Assessment & Plan Note (Signed)
Ongoing since childhood, familial, sister has as well.  Tried Propranolol in past with side effects presenting and stopped.  Continue collaboration with neurology.

## 2022-07-19 ENCOUNTER — Ambulatory Visit: Payer: Medicare PPO | Admitting: Anesthesiology

## 2022-07-19 ENCOUNTER — Other Ambulatory Visit: Payer: Self-pay

## 2022-07-19 ENCOUNTER — Encounter
Admission: RE | Disposition: A | Discharge status: Home / Self Care | Payer: Self-pay | Source: Home / Self Care | Attending: Ophthalmology

## 2022-07-19 ENCOUNTER — Encounter: Payer: Self-pay | Admitting: Ophthalmology

## 2022-07-19 ENCOUNTER — Ambulatory Visit
Admission: RE | Admit: 2022-07-19 | Discharge: 2022-07-19 | Disposition: A | Discharge status: Home / Self Care | Payer: Medicare PPO | Attending: Ophthalmology | Admitting: Ophthalmology

## 2022-07-19 DIAGNOSIS — M810 Age-related osteoporosis without current pathological fracture: Secondary | ICD-10-CM | POA: Insufficient documentation

## 2022-07-19 DIAGNOSIS — F419 Anxiety disorder, unspecified: Secondary | ICD-10-CM | POA: Insufficient documentation

## 2022-07-19 DIAGNOSIS — H02403 Unspecified ptosis of bilateral eyelids: Secondary | ICD-10-CM | POA: Diagnosis not present

## 2022-07-19 DIAGNOSIS — Z87891 Personal history of nicotine dependence: Secondary | ICD-10-CM | POA: Diagnosis not present

## 2022-07-19 DIAGNOSIS — F32A Depression, unspecified: Secondary | ICD-10-CM | POA: Insufficient documentation

## 2022-07-19 DIAGNOSIS — Z79899 Other long term (current) drug therapy: Secondary | ICD-10-CM | POA: Diagnosis not present

## 2022-07-19 DIAGNOSIS — E785 Hyperlipidemia, unspecified: Secondary | ICD-10-CM | POA: Insufficient documentation

## 2022-07-19 DIAGNOSIS — K219 Gastro-esophageal reflux disease without esophagitis: Secondary | ICD-10-CM | POA: Insufficient documentation

## 2022-07-19 DIAGNOSIS — H02831 Dermatochalasis of right upper eyelid: Secondary | ICD-10-CM | POA: Diagnosis not present

## 2022-07-19 DIAGNOSIS — H02834 Dermatochalasis of left upper eyelid: Secondary | ICD-10-CM | POA: Insufficient documentation

## 2022-07-19 HISTORY — PX: BROW LIFT: SHX178

## 2022-07-19 LAB — VITAMIN B12: Vitamin B-12: 868 pg/mL (ref 232–1245)

## 2022-07-19 LAB — COMPREHENSIVE METABOLIC PANEL
ALT: 22 IU/L (ref 0–32)
AST: 21 IU/L (ref 0–40)
Albumin/Globulin Ratio: 2 (ref 1.2–2.2)
Albumin: 4.5 g/dL (ref 3.8–4.8)
Alkaline Phosphatase: 77 IU/L (ref 44–121)
BUN/Creatinine Ratio: 12 (ref 12–28)
BUN: 9 mg/dL (ref 8–27)
Bilirubin Total: 0.3 mg/dL (ref 0.0–1.2)
CO2: 23 mmol/L (ref 20–29)
Calcium: 9.2 mg/dL (ref 8.7–10.3)
Chloride: 103 mmol/L (ref 96–106)
Creatinine, Ser: 0.73 mg/dL (ref 0.57–1.00)
Globulin, Total: 2.2 g/dL (ref 1.5–4.5)
Glucose: 91 mg/dL (ref 70–99)
Potassium: 4.2 mmol/L (ref 3.5–5.2)
Sodium: 139 mmol/L (ref 134–144)
Total Protein: 6.7 g/dL (ref 6.0–8.5)
eGFR: 87 mL/min/{1.73_m2} (ref >59)

## 2022-07-19 LAB — LIPID PANEL W/O CHOL/HDL RATIO
Cholesterol, Total: 155 mg/dL (ref 100–199)
HDL: 44 mg/dL (ref >39)
LDL Chol Calc (NIH): 94 mg/dL (ref 0–99)
Triglycerides: 93 mg/dL (ref 0–149)
VLDL Cholesterol Cal: 17 mg/dL (ref 5–40)

## 2022-07-19 LAB — HEMOGLOBIN A1C
Est. average glucose Bld gHb Est-mCnc: 126 mg/dL
Hgb A1c MFr Bld: 6 % — ABNORMAL HIGH (ref 4.8–5.6)

## 2022-07-19 LAB — VITAMIN D 25 HYDROXY (VIT D DEFICIENCY, FRACTURES): Vit D, 25-Hydroxy: 26 ng/mL — ABNORMAL LOW (ref 30.0–100.0)

## 2022-07-19 LAB — MAGNESIUM: Magnesium: 2.2 mg/dL (ref 1.6–2.3)

## 2022-07-19 SURGERY — BLEPHAROPLASTY
Anesthesia: General | Site: Eye | Laterality: Bilateral

## 2022-07-19 MED ORDER — ERYTHROMYCIN 5 MG/GM OP OINT
TOPICAL_OINTMENT | OPHTHALMIC | Status: DC | PRN
  Administered 2022-07-19: 1 via OPHTHALMIC

## 2022-07-19 MED ORDER — FENTANYL CITRATE (PF) 100 MCG/2ML IJ SOLN
INTRAMUSCULAR | Status: DC | PRN
  Administered 2022-07-19: 25 ug via INTRAVENOUS

## 2022-07-19 MED ORDER — BSS IO SOLN
INTRAOCULAR | Status: DC | PRN
  Administered 2022-07-19: 15 mL

## 2022-07-19 MED ORDER — ERYTHROMYCIN 5 MG/GM OP OINT: TOPICAL_OINTMENT | OPHTHALMIC | 2 refills | Status: DC

## 2022-07-19 MED ORDER — TETRACAINE HCL 0.5 % OP SOLN
OPHTHALMIC | Status: DC | PRN
  Administered 2022-07-19: 2 [drp] via OPHTHALMIC

## 2022-07-19 MED ORDER — LIDOCAINE HCL (CARDIAC) PF 100 MG/5ML IV SOSY
PREFILLED_SYRINGE | INTRAVENOUS | Status: DC | PRN
  Administered 2022-07-19: 40 mg via INTRAVENOUS

## 2022-07-19 MED ORDER — MIDAZOLAM HCL 2 MG/2ML IJ SOLN
INTRAMUSCULAR | Status: DC | PRN
  Administered 2022-07-19: 1 mg via INTRAVENOUS

## 2022-07-19 MED ORDER — LIDOCAINE-EPINEPHRINE 2 %-1:100000 IJ SOLN
INTRAMUSCULAR | Status: DC | PRN
  Administered 2022-07-19: 2 mL via OPHTHALMIC

## 2022-07-19 MED ORDER — TRAMADOL HCL 50 MG PO TABS: ORAL_TABLET | ORAL | 0 refills | Status: DC

## 2022-07-19 MED ORDER — PROPOFOL 500 MG/50ML IV EMUL
INTRAVENOUS | Status: DC | PRN
  Administered 2022-07-19: 60 mg via INTRAVENOUS
  Administered 2022-07-19: 20 mg via INTRAVENOUS

## 2022-07-19 MED ORDER — LACTATED RINGERS IV SOLN: INTRAVENOUS | Status: DC

## 2022-07-19 SURGICAL SUPPLY — 22 items
APPLICATOR COTTON TIP WD 3 STR (MISCELLANEOUS) ×1 IMPLANT
BLADE SURG 15 STRL LF DISP TIS (BLADE) ×1 IMPLANT
BLADE SURG 15 STRL SS (BLADE) ×1
CORD BIP STRL DISP 12FT (MISCELLANEOUS) ×1 IMPLANT
GAUZE SPONGE 2X2 STRL 8-PLY (GAUZE/BANDAGES/DRESSINGS) IMPLANT
GAUZE SPONGE 4X4 12PLY STRL (GAUZE/BANDAGES/DRESSINGS) ×1 IMPLANT
GLOVE SURG UNDER POLY LF SZ7 (GLOVE) ×2 IMPLANT
GOWN STRL REUS W/ TWL LRG LVL3 (GOWN DISPOSABLE) ×1 IMPLANT
GOWN STRL REUS W/TWL LRG LVL3 (GOWN DISPOSABLE) ×1
MARKER SKIN XFINE TIP W/RULER (MISCELLANEOUS) ×1 IMPLANT
NDL FILTER BLUNT 18X1 1/2 (NEEDLE) ×1 IMPLANT
NDL HYPO 30X.5 LL (NEEDLE) ×2 IMPLANT
NEEDLE FILTER BLUNT 18X1 1/2 (NEEDLE) ×1 IMPLANT
NEEDLE HYPO 30X.5 LL (NEEDLE) ×2 IMPLANT
PACK ENT CUSTOM (PACKS) ×1 IMPLANT
SOL PREP PVP 2OZ (MISCELLANEOUS) ×1
SOLUTION PREP PVP 2OZ (MISCELLANEOUS) ×1 IMPLANT
SUT GUT PLAIN 6-0 1X18 ABS (SUTURE) ×1 IMPLANT
SUT PROLENE 6 0 P 1 18 (SUTURE) IMPLANT
SYR 10ML LL (SYRINGE) ×1 IMPLANT
SYR 3ML LL SCALE MARK (SYRINGE) ×1 IMPLANT
WATER STERILE IRR 250ML POUR (IV SOLUTION) ×1 IMPLANT

## 2022-07-19 NOTE — Transfer of Care (Signed)
Immediate Anesthesia Transfer of Care Note  Patient: Tabitha Larson  Procedure(s) Performed: BLEPHAROPLASTY UPPER EYELID; W/EXCESS SKIN BLEPHAROPTOSIS REPAIR; RESECT EX BILATERAL (Bilateral: Eye)  Patient Location: PACU  Anesthesia Type: General  Level of Consciousness: awake, alert  and patient cooperative  Airway and Oxygen Therapy: Patient Spontanous Breathing and Patient connected to supplemental oxygen  Post-op Assessment: Post-op Vital signs reviewed, Patient's Cardiovascular Status Stable, Respiratory Function Stable, Patent Airway and No signs of Nausea or vomiting  Post-op Vital Signs: Reviewed and stable  Complications: No notable events documented.

## 2022-07-19 NOTE — Op Note (Signed)
Preoperative Diagnosis:  1. Visually significant blepharoptosis both  Upper Eyelid(s) 2. Visually significant dermatochalasis both  Upper Eyelid(s)  Postoperative Diagnosis:  Same.  Procedure(s) Performed:   1. Blepharoptosis repair with levator aponeurosis advancement both  Upper Eyelid(s) 2. Upper eyelid blepharoplasty with excess skin excision  both  Upper Eyelid(s)  Surgeon: Hubbard Robinson. Ether Griffins, M.D.  Assistants: none  Anesthesia: MAC  Specimens: None.  Estimated Blood Loss: Minimal.  Complications: None.  Operative Findings: None Dictated  Procedure:   Allergies were reviewed and the patient Patient has no known allergies..   After the risks, benefits, complications and alternatives were discussed with the patient, appropriate informed consent was obtained.  While seated in an upright position and looking in primary gaze, the mid pupillary line was marked on the upper eyelid margins bilaterally. The patient was then brought to the operating suite and reclined supine.  Timeout was conducted and the patient was sedated.  Local anesthetic consisting of a 50-50 mixture of 2% lidocaine with epinephrine and 0.75% bupivacaine with added Hylenex was injected subcutaneously to both  upper eyelid(s). After adequate local was instilled, the patient was prepped and draped in the usual sterile fashion for eyelid surgery.   Attention was turned to the upper eyelids. A 9mm upper eyelid crease incision line was marked with calipers on both  upper eyelid(s).  A pinch test was used to estimate the amount of excess skin to remove and this was marked in standard blepharoplasty style fashion. Attention was turned to the  right  upper eyelid. A #15 blade was used to open the premarked incision line. A Skin and muscle flap was excised and hemostasis was obtained with bipolar cautery. A buttonhole was created medially in orbicularis and orbital septum to reveal the medial fat pocket. This was dissected free  from fascial attachments, cauterized towards the pedicle base and excised to produce a nice flattening of the medial corner of the upper eyelid.  Westcott scissors were then used to transect through orbicularis down to the tarsal plate. Epitarsus was dissected to create a smooth surface to suture to. Dissection was then carried superiorly in the plane between orbicularis and orbital septum. Once the preaponeurotic fat pocket was identified, the orbital septum was opened. This revealed the levator and its aponeurosis.    Attention was then turned to the opposite eyelid where the same procedure was performed in the same manner. Hemostasis was obtained with bipolar cautery throughout.   3 interrupted 6-0 Prolene sutures were then passed partial thickness through the tarsal plates of both  upper eyelid(s). These sutures were placed in line with the mid pupillary, medial limbal, and lateral limbal lines. The sutures were fixed to the levator aponeurosis and adjusted until a nice lid height and contour were achieved. Once nice symmetry was achieved, the skin incisions were closed with a running 6-0 plain gut suture. The patient tolerated the procedure well.  Erythromycin ophthalmic ophthalmic ointment was applied to the incision site(s) followed by ice packs. The patient was taken to the recovery area where she recovered without difficulty.  Post-Op Plan/Instructions:   The patient was instructed to use ice packs frequently for the next 48 hours. She was instructed to use Erythromycin ophthalmic ophthalmic ointment on her incisions 4 times a day for the next 12 to 14 days. Shewas given a prescription for tramadol (or similar) for pain control should Tylenol not be effective. She was asked to to follow up at the [facility] in [place], [place]  in 2-3 weeks' time or sooner as needed for problems.  Amberleigh Gerken M. Ether Griffins, M.D. Ophthalmology

## 2022-07-19 NOTE — Telephone Encounter (Signed)
LVM for patient informing her that orders have been changed and that she is now able to call and schedule her mammogram.

## 2022-07-19 NOTE — Telephone Encounter (Signed)
Patient informed Tabitha Larson that she was having left breast pain when she went to schedule her mammogram, They are now asking for Left breast diagnostic and u/s

## 2022-07-19 NOTE — H&P (Signed)
[facility]: [facility]  Primary Care Physician:  Marjie Skiff, NP Ophthalmologist: Dr. Hubbard Robinson. Ether Griffins, M.D.  Pre-Procedure History & Physical: HPI:  Tabitha Larson is a 72 y.o. female here for periocular surgery.   Past Medical History:  Diagnosis Date   Ankle fracture    Anxiety    Depression    GERD (gastroesophageal reflux disease)    Hyperlipidemia    Migraine    Osteoporosis    Wears dentures    full upper and lower    Past Surgical History:  Procedure Laterality Date   ANKLE FRACTURE SURGERY     CATARACT EXTRACTION Right 01/2014   CATARACT EXTRACTION W/PHACO Left 03/26/2016   Procedure: CATARACT EXTRACTION PHACO AND INTRAOCULAR LENS PLACEMENT (IOC)  left;  Surgeon: Nevada Crane, MD;  Location: [facility];  Service: Ophthalmology;  Laterality: Left;   COLONOSCOPY  02/04/2014   COLONOSCOPY WITH PROPOFOL N/A 07/28/2015   Procedure: COLONOSCOPY WITH PROPOFOL;  Surgeon: Midge Minium, MD;  Location: [facility];  Service: Endoscopy;  Laterality: N/A;  No anesthesia   COLONOSCOPY WITH PROPOFOL N/A 05/16/2020   Procedure: COLONOSCOPY WITH PROPOFOL;  Surgeon: Midge Minium, MD;  Location: [facility];  Service: Endoscopy;  Laterality: N/A;   ESOPHAGOGASTRODUODENOSCOPY (EGD) WITH PROPOFOL N/A 12/17/2018   Procedure: ESOPHAGOGASTRODUODENOSCOPY (EGD) WITH Dilation;  Surgeon: Midge Minium, MD;  Location: [facility];  Service: Endoscopy;  Laterality: N/A;  requests mid-morning arrival   MANDIBLE SURGERY     POLYPECTOMY  07/28/2015   Procedure: POLYPECTOMY;  Surgeon: Midge Minium, MD;  Location: [facility];  Service: Endoscopy;;    Prior to Admission medications   Medication Sig Start Date End Date Taking? Authorizing Provider  DULoxetine (CYMBALTA) 60 MG capsule Take 1 capsule (60 mg total) by mouth daily. 07/18/22  Yes Cannady, Corrie Dandy T, NP  pantoprazole (PROTONIX) 20 MG tablet Take 1 tablet by mouth once daily 08/01/21  Yes  Cannady, Jolene T, NP  rosuvastatin (CRESTOR) 40 MG tablet Take 1 tablet by mouth once daily 07/09/22  Yes Cannady, Jolene T, NP  vitamin B-12 (CYANOCOBALAMIN) 1000 MCG tablet Take 1,000 mcg by mouth daily.   Yes [provider]    Allergies as of 06/13/2022   (No Known Allergies)    Family History  Problem Relation Age of Onset   Stroke Mother    Prostate cancer Father    Colon cancer Sister    Hypertension Sister    Hyperlipidemia Sister    Breast cancer Neg Hx     Social History   Socioeconomic History   Marital status: Divorced    Spouse name: Not on file   Number of children: Not on file   Years of education: Not on file   Highest education level: High school graduate  Occupational History   Occupation: retired  Tobacco Use   Smoking status: Former    Types: Cigarettes    Quit date: 01/05/1984    Years since quitting: 38.5   Smokeless tobacco: Never  Vaping Use   Vaping Use: Never used  Substance and Sexual Activity   Alcohol use: No    Alcohol/week: 0.0 standard drinks of alcohol   Drug use: No   Sexual activity: Not Currently    Birth control/protection: Post-menopausal  Other Topics Concern   Not on file  Social History Narrative   Niece lives in area   No kids   Has friends close by   [place] live with her currently  Social Determinants of Health   Financial Resource Strain: Low Risk  (06/25/2021)   Overall Financial Resource Strain (CARDIA)    Difficulty of Paying Living Expenses: Not hard at all  Food Insecurity: No Food Insecurity (06/25/2021)   Hunger Vital Sign    Worried About Running Out of Food in the Last Year: Never true    Ran Out of Food in the Last Year: Never true  Transportation Needs: No Transportation Needs (06/25/2021)   PRAPARE - Administrator, Civil Service (Medical): No    Lack of Transportation (Non-Medical): No  Physical Activity: Inactive (06/25/2021)   Exercise Vital Sign    Days of Exercise per Week:  0 days    Minutes of Exercise per Session: 0 min  Stress: No Stress Concern Present (06/25/2021)   [employer] of Occupational Health - Occupational Stress Questionnaire    Feeling of Stress : Not at all  Social Connections: Moderately Isolated (06/25/2021)   Social Connection and Isolation Panel [NHANES]    Frequency of Communication with Friends and Family: Three times a week    Frequency of Social Gatherings with Friends and Family: Once a week    Attends Religious Services: More than 4 times per year    Active Member of [employer] or Organizations: No    Attends Banker Meetings: Never    Marital Status: Divorced  Catering manager Violence: Not At Risk (12/06/2020)   Humiliation, Afraid, Rape, and Kick questionnaire    Fear of Current or Ex-Partner: No    Emotionally Abused: No    Physically Abused: No    Sexually Abused: No    Review of Systems: See HPI, otherwise negative ROS  Physical Exam: BP (!) 130/90   Temp 98.8 F (37.1 C) (Temporal)   Resp 12   Ht 5' 2.01" (1.575 m)   Wt 64.6 kg   LMP  (LMP Unknown)   SpO2 96%   BMI 26.04 kg/m  General:   Alert and cooperative in NAD Head:  Normocephalic and atraumatic. Respiratory:  Normal work of breathing.  Impression/Plan: Tabitha Larson is here for periocular surgery.  Risks, benefits, limitations, and alternatives regarding surgery have been reviewed with the patient.  Questions have been answered.  All parties agreeable.   Imagene Riches, MD  07/19/2022, 10:58 AM

## 2022-07-19 NOTE — Interval H&P Note (Signed)
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  The various methods of treatment have been discussed with the patient and family. After consideration of risks, benefits and other options for treatment, the patient has consented to  Procedure(s): BLEPHAROPLASTY UPPER EYELID; W/EXCESS SKIN BLEPHAROPTOSIS REPAIR; RESECT EX BILATERAL (Bilateral) as a surgical intervention.  The patient's history has been reviewed, patient examined, no change in status, stable for surgery.  I have reviewed the patient's chart and labs.  Questions were answered to the patient's satisfaction.     Ether Griffins, Yamila Cragin M

## 2022-07-19 NOTE — Anesthesia Postprocedure Evaluation (Signed)
Anesthesia Post Note  Patient: Tabitha Larson  Procedure(s) Performed: BLEPHAROPLASTY UPPER EYELID; W/EXCESS SKIN BLEPHAROPTOSIS REPAIR; RESECT EX BILATERAL (Bilateral: Eye)  Patient location during evaluation: PACU Anesthesia Type: General Level of consciousness: awake and alert Pain management: pain level controlled Vital Signs Assessment: post-procedure vital signs reviewed and stable Respiratory status: spontaneous breathing, nonlabored ventilation, respiratory function stable and patient connected to nasal cannula oxygen Cardiovascular status: stable and blood pressure returned to baseline Postop Assessment: no apparent nausea or vomiting Anesthetic complications: no   No notable events documented.   Last Vitals:  Vitals:   07/19/22 1230 07/19/22 1245  BP: 107/73 120/69  Pulse: (!) 58 (!) 58  Resp: 15 17  Temp: 36.4 C 36.4 C  SpO2: (!) 89% 99%    Last Pain:  Vitals:   07/19/22 1245  TempSrc:   PainSc: 0-No pain                 Zyheir Daft C Maytal Mijangos

## 2022-07-19 NOTE — Progress Notes (Signed)
Good morning, please let Charon know her labs have returned: - A1c remains at 6%, similar to previous, in prediabetic range.  Continue focus on diet changes and regular activity. - Vitamin D remains a little on low side, please ensure you are taking over the counter Vitamin D3 2000 units daily. - Remainder of labs are all stable, including kidney and liver function.  No medication changes needed.  Have a wonderful day!! Keep being amazing!!  Thank you for allowing me to participate in your care.  I appreciate you. Kindest regards, Srihith Aquilino

## 2022-07-22 ENCOUNTER — Other Ambulatory Visit (HOSPITAL_COMMUNITY): Payer: Self-pay | Admitting: Nurse Practitioner

## 2022-07-22 ENCOUNTER — Encounter: Payer: Self-pay | Admitting: Ophthalmology

## 2022-07-22 DIAGNOSIS — N644 Mastodynia: Secondary | ICD-10-CM

## 2022-07-23 ENCOUNTER — Ambulatory Visit (INDEPENDENT_AMBULATORY_CARE_PROVIDER_SITE_OTHER): Payer: Medicare PPO

## 2022-07-23 VITALS — Ht 62.0 in | Wt 143.0 lb

## 2022-07-23 DIAGNOSIS — Z0000 Encounter for general adult medical examination without abnormal findings: Secondary | ICD-10-CM | POA: Diagnosis not present

## 2022-07-23 NOTE — Patient Instructions (Signed)
Tabitha Larson , Thank you for taking time to come for your Medicare Wellness Visit. I appreciate your ongoing commitment to your health goals. Please review the following plan we discussed and let me know if I can assist you in the future.   These are the goals we discussed:  Goals      DIET - EAT MORE FRUITS AND VEGETABLES     DIET - INCREASE WATER INTAKE     Recommend drinking at least 6-8 glasses of water      Patient Stated     06/23/2020, decrease sugar intake, eat healthier     Patient Stated     Would like to loose weight and cut back on sugar        This is a list of the screening recommended for you and due dates:  Health Maintenance  Topic Date Due   DEXA scan (bone density measurement)  05/28/2022   COVID-19 Vaccine (4 - 2023-24 season) 08/03/2022*   Zoster (Shingles) Vaccine (1 of 2) 10/18/2022*   Mammogram  08/30/2022   Flu Shot  10/03/2022   Medicare Annual Wellness Visit  07/23/2023   Colon Cancer Screening  05/16/2025   DTaP/Tdap/Td vaccine (4 - Td or Tdap) 07/17/2032   Pneumonia Vaccine  Completed   Hepatitis C Screening: USPSTF Recommendation to screen - Ages 1-79 yo.  Completed   HPV Vaccine  Aged Out  *Topic was postponed. The date shown is not the original due date.    Advanced directives: no  Conditions/risks identified: none  Next appointment: Follow up in one year for your annual wellness visit 07/29/23 @ 11:15 am by phone   Preventive Care 65 Years and Older, Female Preventive care refers to lifestyle choices and visits with your health care provider that can promote health and wellness. What does preventive care include? A yearly physical exam. This is also called an annual well check. Dental exams once or twice a year. Routine eye exams. Ask your health care provider how often you should have your eyes checked. Personal lifestyle choices, including: Daily care of your teeth and gums. Regular physical activity. Eating a healthy  diet. Avoiding tobacco and drug use. Limiting alcohol use. Practicing safe sex. Taking low-dose aspirin every day. Taking vitamin and mineral supplements as recommended by your health care provider. What happens during an annual well check? The services and screenings done by your health care provider during your annual well check will depend on your age, overall health, lifestyle risk factors, and family history of disease. Counseling  Your health care provider may ask you questions about your: Alcohol use. Tobacco use. Drug use. Emotional well-being. Home and relationship well-being. Sexual activity. Eating habits. History of falls. Memory and ability to understand (cognition). Work and work Astronomer. Reproductive health. Screening  You may have the following tests or measurements: Height, weight, and BMI. Blood pressure. Lipid and cholesterol levels. These may be checked every 5 years, or more frequently if you are over 33 years old. Skin check. Lung cancer screening. You may have this screening every year starting at age 4 if you have a 30-pack-year history of smoking and currently smoke or have quit within the past 15 years. Fecal occult blood test (FOBT) of the stool. You may have this test every year starting at age 37. Flexible sigmoidoscopy or colonoscopy. You may have a sigmoidoscopy every 5 years or a colonoscopy every 10 years starting at age 63. Hepatitis C blood test. Hepatitis B blood test. Sexually  transmitted disease (STD) testing. Diabetes screening. This is done by checking your blood sugar (glucose) after you have not eaten for a while (fasting). You may have this done every 1-3 years. Bone density scan. This is done to screen for osteoporosis. You may have this done starting at age 39. Mammogram. This may be done every 1-2 years. Talk to your health care provider about how often you should have regular mammograms. Talk with your health care provider about  your test results, treatment options, and if necessary, the need for more tests. Vaccines  Your health care provider may recommend certain vaccines, such as: Influenza vaccine. This is recommended every year. Tetanus, diphtheria, and acellular pertussis (Tdap, Td) vaccine. You may need a Td booster every 10 years. Zoster vaccine. You may need this after age 15. Pneumococcal 13-valent conjugate (PCV13) vaccine. One dose is recommended after age 53. Pneumococcal polysaccharide (PPSV23) vaccine. One dose is recommended after age 74. Talk to your health care provider about which screenings and vaccines you need and how often you need them. This information is not intended to replace advice given to you by your health care provider. Make sure you discuss any questions you have with your health care provider. Document Released: 03/17/2015 Document Revised: 11/08/2015 Document Reviewed: 12/20/2014 Elsevier Interactive Patient Education  2017 [employer].  Fall Prevention in the Home Falls can cause injuries. They can happen to people of all ages. There are many things you can do to make your home safe and to help prevent falls. What can I do on the outside of my home? Regularly fix the edges of walkways and driveways and fix any cracks. Remove anything that might make you trip as you walk through a door, such as a raised step or threshold. Trim any bushes or trees on the path to your home. Use bright outdoor lighting. Clear any walking paths of anything that might make someone trip, such as rocks or tools. Regularly check to see if handrails are loose or broken. Make sure that both sides of any steps have handrails. Any raised decks and porches should have guardrails on the edges. Have any leaves, snow, or ice cleared regularly. Use sand or salt on walking paths during winter. Clean up any spills in your garage right away. This includes oil or grease spills. What can I do in the bathroom? Use  night lights. Install grab bars by the toilet and in the tub and shower. Do not use towel bars as grab bars. Use non-skid mats or decals in the tub or shower. If you need to sit down in the shower, use a plastic, non-slip stool. Keep the floor dry. Clean up any water that spills on the floor as soon as it happens. Remove soap buildup in the tub or shower regularly. Attach bath mats securely with double-sided non-slip rug tape. Do not have throw rugs and other things on the floor that can make you trip. What can I do in the bedroom? Use night lights. Make sure that you have a light by your bed that is easy to reach. Do not use any sheets or blankets that are too big for your bed. They should not hang down onto the floor. Have a firm chair that has side arms. You can use this for support while you get dressed. Do not have throw rugs and other things on the floor that can make you trip. What can I do in the kitchen? Clean up any spills right away. Avoid  walking on wet floors. Keep items that you use a lot in easy-to-reach places. If you need to reach something above you, use a strong step stool that has a grab bar. Keep electrical cords out of the way. Do not use floor polish or wax that makes floors slippery. If you must use wax, use non-skid floor wax. Do not have throw rugs and other things on the floor that can make you trip. What can I do with my stairs? Do not leave any items on the stairs. Make sure that there are handrails on both sides of the stairs and use them. Fix handrails that are broken or loose. Make sure that handrails are as long as the stairways. Check any carpeting to make sure that it is firmly attached to the stairs. Fix any carpet that is loose or worn. Avoid having throw rugs at the top or bottom of the stairs. If you do have throw rugs, attach them to the floor with carpet tape. Make sure that you have a light switch at the top of the stairs and the bottom of the  stairs. If you do not have them, ask someone to add them for you. What else can I do to help prevent falls? Wear shoes that: Do not have high heels. Have rubber bottoms. Are comfortable and fit you well. Are closed at the toe. Do not wear sandals. If you use a stepladder: Make sure that it is fully opened. Do not climb a closed stepladder. Make sure that both sides of the stepladder are locked into place. Ask someone to hold it for you, if possible. Clearly mark and make sure that you can see: Any grab bars or handrails. First and last steps. Where the edge of each step is. Use tools that help you move around (mobility aids) if they are needed. These include: Canes. Walkers. Scooters. Crutches. Turn on the lights when you go into a dark area. Replace any light bulbs as soon as they burn out. Set up your furniture so you have a clear path. Avoid moving your furniture around. If any of your floors are uneven, fix them. If there are any pets around you, be aware of where they are. Review your medicines with your doctor. Some medicines can make you feel dizzy. This can increase your chance of falling. Ask your doctor what other things that you can do to help prevent falls. This information is not intended to replace advice given to you by your health care provider. Make sure you discuss any questions you have with your health care provider. Document Released: 12/15/2008 Document Revised: 07/27/2015 Document Reviewed: 03/25/2014 Elsevier Interactive Patient Education  2017 [employer].

## 2022-07-23 NOTE — Progress Notes (Signed)
I connected with  [name] on 07/23/22 by a audio enabled telemedicine application and verified that I am speaking with the correct person using two identifiers.  Patient Location: Home  Provider Location: Office/Clinic  I discussed the limitations of evaluation and management by telemedicine. The patient expressed understanding and agreed to proceed.  Subjective:   Tabitha Larson is a 72 y.o. female who presents for Medicare Annual (Subsequent) preventive examination.  Review of Systems     Cardiac Risk Factors include: advanced age (>32men, >42 women);hypertension     Objective:    Today's Vitals   07/23/22 0904  PainSc: 3    There is no height or weight on file to calculate BMI.     07/23/2022    9:09 AM 07/19/2022    9:48 AM 06/25/2021    9:06 AM 06/23/2020    9:02 AM 05/16/2020    9:39 AM 06/14/2019    9:36 AM 01/11/2019    6:10 PM  Advanced Directives  Does Patient Have a Medical Advance Directive? No Yes Yes Yes Yes Yes No  Type of Special educational needs teacher of [place];Living will Healthcare Power of [employer] of [place];Living will Healthcare Power of [place];Living will    Does patient want to make changes to medical advance directive?      Yes (MAU/Ambulatory/Procedural Areas - Information given)   Copy of Healthcare Power of Attorney in Chart?  No - copy requested No - copy requested No - copy requested No - copy requested    Would patient like information on creating a medical advance directive? No - Patient declined      No - Patient declined    Current Medications (verified) Outpatient Encounter Medications as of 07/23/2022  Medication Sig   DULoxetine (CYMBALTA) 60 MG capsule Take 1 capsule (60 mg total) by mouth daily.   erythromycin ophthalmic ointment Apply to sutures 4 times a day for 10-12 days.  Discontinue if allergy develops and call our office   pantoprazole (PROTONIX) 20 MG tablet Take 1 tablet by mouth once daily    rosuvastatin (CRESTOR) 40 MG tablet Take 1 tablet by mouth once daily   vitamin B-12 (CYANOCOBALAMIN) 1000 MCG tablet Take 1,000 mcg by mouth daily.   traMADol (ULTRAM) 50 MG tablet Take 1 every 4-6 hours as needed for pain not controlled by Tylenol (Patient not taking: Reported on 07/23/2022)   No facility-administered encounter medications on file as of 07/23/2022.    Allergies (verified) Patient has no known allergies.   History: Past Medical History:  Diagnosis Date   Ankle fracture    Anxiety    Depression    GERD (gastroesophageal reflux disease)    Hyperlipidemia    Migraine    Osteoporosis    Wears dentures    full upper and lower   Past Surgical History:  Procedure Laterality Date   ANKLE FRACTURE SURGERY     BROW LIFT Bilateral 07/19/2022   Procedure: BLEPHAROPLASTY UPPER EYELID; W/EXCESS SKIN BLEPHAROPTOSIS REPAIR; RESECT EX BILATERAL;  Surgeon: Imagene Riches, MD;  Location[facility];  Service: Ophthalmology;  Laterality: Bilateral;   CATARACT EXTRACTION Right 01/2014   CATARACT EXTRACTION W/PHACO Left 03/26/2016   Procedure: CATARACT EXTRACTION PHACO AND INTRAOCULAR LENS PLACEMENT (IOC)  left;  Surgeon: Nevada Crane, MD;  Location: [facility];  Service: Ophthalmology;  Laterality: Left;   COLONOSCOPY  02/04/2014   COLONOSCOPY WITH PROPOFOL N/A 07/28/2015   Procedure: COLONOSCOPY WITH PROPOFOL;  Surgeon: Midge Minium, MD;  Location: [facility];  Service: Endoscopy;  Laterality: N/A;  No anesthesia   COLONOSCOPY WITH PROPOFOL N/A 05/16/2020   Procedure: COLONOSCOPY WITH PROPOFOL;  Surgeon: Midge Minium, MD;  Location: [facility];  Service: Endoscopy;  Laterality: N/A;   ESOPHAGOGASTRODUODENOSCOPY (EGD) WITH PROPOFOL N/A 12/17/2018   Procedure: ESOPHAGOGASTRODUODENOSCOPY (EGD) WITH Dilation;  Surgeon: Midge Minium, MD;  Location: [facility];  Service: Endoscopy;  Laterality: N/A;  requests mid-morning arrival   MANDIBLE SURGERY      POLYPECTOMY  07/28/2015   Procedure: POLYPECTOMY;  Surgeon: Midge Minium, MD;  Location: [facility];  Service: Endoscopy;;   Family History  Problem Relation Age of Onset   Stroke Mother    Prostate cancer Father    Colon cancer Sister    Hypertension Sister    Hyperlipidemia Sister    Breast cancer Neg Hx    Social History   Socioeconomic History   Marital status: Divorced    Spouse name: Not on file   Number of children: Not on file   Years of education: Not on file   Highest education level: High school graduate  Occupational History   Occupation: retired  Tobacco Use   Smoking status: Former    Types: Cigarettes    Quit date: 01/05/1984    Years since quitting: 38.5   Smokeless tobacco: Never  Vaping Use   Vaping Use: Never used  Substance and Sexual Activity   Alcohol use: No    Alcohol/week: 0.0 standard drinks of alcohol   Drug use: No   Sexual activity: Not Currently    Birth control/protection: Post-menopausal  Other Topics Concern   Not on file  Social History Narrative   Niece lives in area   No kids   Has friends close by   [place] live with her currently    Social Determinants of Health   Financial Resource Strain: Low Risk  (07/23/2022)   Overall Financial Resource Strain (CARDIA)    Difficulty of Paying Living Expenses: Not very hard  Food Insecurity: No Food Insecurity (07/23/2022)   Hunger Vital Sign    Worried About Running Out of Food in the Last Year: Never true    Ran Out of Food in the Last Year: Never true  Transportation Needs: No Transportation Needs (07/23/2022)   PRAPARE - Administrator, Civil Service (Medical): No    Lack of Transportation (Non-Medical): No  Physical Activity: Insufficiently Active (07/23/2022)   Exercise Vital Sign    Days of Exercise per Week: 3 days    Minutes of Exercise per Session: 30 min  Stress: No Stress Concern Present (07/23/2022)   [employer] of Occupational Health -  Occupational Stress Questionnaire    Feeling of Stress : Only a little  Social Connections: Unknown (07/23/2022)   Social Connection and Isolation Panel [NHANES]    Frequency of Communication with Friends and Family: Twice a week    Frequency of Social Gatherings with Friends and Family: Not on file    Attends Religious Services: More than 4 times per year    Active Member of [employer] or Organizations: No    Attends Engineer, structural: Never    Marital Status: Divorced    Tobacco Counseling Counseling given: Not Answered   Clinical Intake:  Pre-visit preparation completed: Yes  Pain : 0-10 Pain Score: 3  Pain Type: Acute pain Pain Location: Eye Pain Orientation: Right, Left Pain Radiating Towards: just had eye  surgery     Nutritional Risks: None Diabetes: No  How often do you need to have someone help you when you read instructions, pamphlets, or other written materials from your doctor or pharmacy?: 1 - Never  Diabetic?no  Interpreter Needed?: No  Information entered by :: Kennedy Bucker, LPN   Activities of Daily Living    07/23/2022    9:10 AM 07/19/2022    9:47 AM  In your present state of health, do you have any difficulty performing the following activities:  Hearing? 0 0  Vision? 0 0  Difficulty concentrating or making decisions? 0 0  Walking or climbing stairs? 0 0  Dressing or bathing? 0 0  Doing errands, shopping? 0   Preparing Food and eating ? N   Using the Toilet? N   In the past six months, have you accidently leaked urine? N   Do you have problems with loss of bowel control? N   Managing your Medications? N   Managing your Finances? N   Housekeeping or managing your Housekeeping? N     Patient Care Team: Marjie Skiff, NP as PCP - General (Nurse Practitioner) Nevada Crane, MD as Consulting Physician (Ophthalmology)  Indicate any recent Medical Services you may have received from other than [facility] providers in the past year  (date may be approximate).     Assessment:   This is a routine wellness examination for Brycen.  Hearing/Vision screen Hearing Screening - Comments:: No aids Vision Screening - Comments:: Wears glasses- Dr.King   Dietary issues and exercise activities discussed: Current Exercise Habits: Home exercise routine, Type of exercise: Other - see comments (yard work), Time (Minutes): 30, Frequency (Times/Week): 3, Weekly Exercise (Minutes/Week): 90, Intensity: Mild   Goals Addressed             This Visit's Progress    DIET - EAT MORE FRUITS AND VEGETABLES         Depression Screen    07/23/2022    9:06 AM 07/18/2022    9:48 AM 01/17/2022   10:42 AM 08/17/2021    1:05 PM 06/25/2021    9:07 AM 05/07/2021    9:16 AM 03/02/2021   10:03 AM  PHQ 2/9 Scores  PHQ - 2 Score 1 2 2 3 2 2 2   PHQ- 9 Score 3 11 11 10 5 10 8     Fall Risk    07/23/2022    9:09 AM 07/18/2022    9:47 AM 08/17/2021    1:06 PM 06/25/2021    9:06 AM 03/02/2021   10:03 AM  Fall Risk   Falls in the past year? 1 0 0 0 0  Number falls in past yr: 1 0 0 0 0  Injury with Fall? 0 0 0 0 0  Risk for fall due to : History of fall(s) No Fall Risks No Fall Risks  No Fall Risks  Follow up Falls prevention discussed;Falls evaluation completed Falls evaluation completed Falls evaluation completed Falls evaluation completed;Education provided;Falls prevention discussed Falls evaluation completed    FALL RISK PREVENTION PERTAINING TO THE HOME:  Any stairs in or around the home? Yes  If so, are there any without handrails? No  Home free of loose throw rugs in walkways, pet beds, electrical cords, etc? Yes  Adequate lighting in your home to reduce risk of falls? No   ASSISTIVE DEVICES UTILIZED TO PREVENT FALLS:  Life alert? No  Use of a cane, walker or w/c? No  Grab bars  in the bathroom? No  Shower chair or bench in shower? No  Elevated toilet seat or a handicapped toilet? No   Cognitive Function:        07/23/2022     9:15 AM 06/25/2021    9:04 AM 06/23/2020    9:07 AM 06/14/2019    9:41 AM 05/18/2018   11:07 AM  6CIT Screen  What Year? 0 points 0 points 0 points 0 points 0 points  What month? 0 points 0 points 0 points 0 points 0 points  What time? 0 points 0 points 0 points 0 points 0 points  Count back from 20 0 points 0 points 0 points 0 points 0 points  Months in reverse 0 points 0 points 0 points 0 points 0 points  Repeat phrase 0 points 0 points 0 points 0 points 0 points  Total Score 0 points 0 points 0 points 0 points 0 points    Immunizations Immunization History  Administered Date(s) Administered   DTaP 08/27/2010   Fluad Quad(high Dose 65+) 11/11/2018, 03/21/2020, 12/06/2020, 12/17/2021   Influenza, High Dose Seasonal PF 11/11/2016, 01/08/2018   Influenza,inj,Quad PF,6+ Mos 01/24/2015   Influenza-Unspecified 02/11/2014, 12/12/2015   PFIZER(Purple Top)SARS-COV-2 Vaccination 05/31/2019, 06/29/2019, 12/31/2019   Pneumococcal Conjugate-13 02/11/2014   Pneumococcal Polysaccharide-23 05/01/2015   Td 07/18/2022   Tdap 08/27/2010   Zoster, Live 09/11/2011    TDAP status: Up to date  Flu Vaccine status: Up to date  Pneumococcal vaccine status: Up to date  Covid-19 vaccine status: Completed vaccines  Qualifies for Shingles Vaccine? Yes   Zostavax completed Yes   Shingrix Completed?: No.    Education has been provided regarding the importance of this vaccine. Patient has been advised to call insurance company to determine out of pocket expense if they have not yet received this vaccine. Advised may also receive vaccine at local pharmacy or Health Dept. Verbalized acceptance and understanding.  Screening Tests Health Maintenance  Topic Date Due   DEXA SCAN  05/28/2022   COVID-19 Vaccine (4 - 2023-24 season) 08/03/2022 (Originally 11/02/2021)   Zoster Vaccines- Shingrix (1 of 2) 10/18/2022 (Originally 12/12/1998)   MAMMOGRAM  08/30/2022   INFLUENZA VACCINE  10/03/2022   Medicare  Annual Wellness (AWV)  07/23/2023   COLONOSCOPY (Pts 45-11yrs Insurance coverage will need to be confirmed)  05/16/2025   DTaP/Tdap/Td (4 - Td or Tdap) 07/17/2032   Pneumonia Vaccine 58+ Years old  Completed   Hepatitis C Screening  Completed   HPV VACCINES  Aged Out    Health Maintenance  Health Maintenance Due  Topic Date Due   DEXA SCAN  05/28/2022    Colorectal cancer screening: Type of screening: Colonoscopy. Completed 05/16/20. Repeat every 5 years  Mammogram status: Completed scheduled for 07/25/22. Repeat every year  Bone Density status: Completed scheduled for 08/30/22. Results reflect: Bone density results: NORMAL. Repeat every 5 years.  Lung Cancer Screening: (Low Dose CT Chest recommended if Age 79-80 years, 30 pack-year currently smoking OR have quit w/in 15years.) does not qualify.    Additional Screening:  Hepatitis C Screening: does qualify; Completed 05/01/15  Vision Screening: Recommended annual ophthalmology exams for early detection of glaucoma and other disorders of the eye. Is the patient up to date with their annual eye exam?  Yes  Who is the provider or what is the name of the office in which the patient attends annual eye exams? Dr.King If pt is not established with a provider, would they like to be referred to  a provider to establish care? No .   Dental Screening: Recommended annual dental exams for proper oral hygiene  Community Resource Referral / Chronic Care Management: CRR required this visit?  No   CCM required this visit?  No      Plan:     I have personally reviewed and noted the following in the patient's chart:   Medical and social history Use of alcohol, tobacco or illicit drugs  Current medications and supplements including opioid prescriptions. Patient is not currently taking opioid prescriptions. Functional ability and status Nutritional status Physical activity Advanced directives List of other physicians Hospitalizations,  surgeries, and ER visits in previous 12 months Vitals Screenings to include cognitive, depression, and falls Referrals and appointments  In addition, I have reviewed and discussed with patient certain preventive protocols, quality metrics, and best practice recommendations. A written personalized care plan for preventive services as well as general preventive health recommendations were provided to patient.     Hal Hope, LPN   1/61/0960   Nurse Notes: none

## 2022-07-25 ENCOUNTER — Ambulatory Visit (HOSPITAL_COMMUNITY): Admission: RE | Admit: 2022-07-25 | Payer: Medicare PPO | Source: Ambulatory Visit

## 2022-07-25 ENCOUNTER — Ambulatory Visit
Admission: RE | Admit: 2022-07-25 | Discharge: 2022-07-25 | Disposition: A | Discharge status: Home / Self Care | Payer: Medicare PPO | Source: Ambulatory Visit | Attending: Nurse Practitioner | Admitting: Nurse Practitioner

## 2022-07-25 ENCOUNTER — Encounter (HOSPITAL_COMMUNITY): Payer: Medicare PPO

## 2022-07-25 ENCOUNTER — Inpatient Hospital Stay (HOSPITAL_COMMUNITY): Admission: RE | Admit: 2022-07-25 | Payer: Medicare PPO | Source: Ambulatory Visit

## 2022-07-25 ENCOUNTER — Other Ambulatory Visit (HOSPITAL_COMMUNITY): Payer: Medicare PPO

## 2022-07-25 ENCOUNTER — Ambulatory Visit (HOSPITAL_COMMUNITY): Payer: Medicare PPO

## 2022-07-25 DIAGNOSIS — N644 Mastodynia: Secondary | ICD-10-CM

## 2022-08-28 NOTE — Patient Instructions (Signed)
Nonspecific Chest Pain Chest pain can be caused by many different conditions. Some causes of chest pain can be life-threatening. These will require treatment right away. Serious causes of chest pain include: Heart attack. A tear in the body's main blood vessel. Redness and swelling (inflammation) around your heart. Blood clot in your lungs. Other causes of chest pain may not be so serious. These include: Heartburn. Anxiety or stress. Damage to bones or muscles in your chest. Lung infections. Chest pain can feel like: Pain or discomfort in your chest. Crushing, pressure, aching, or squeezing pain. Burning or tingling. Dull or sharp pain that is worse when you move, cough, or take a deep breath. Pain or discomfort that is also felt in your back, neck, jaw, shoulder, or arm, or pain that spreads to any of these areas. It is hard to know whether your pain is caused by something that is serious or something that is not so serious. So it is important to see your doctor right away if you have chest pain. Follow these instructions at home: Medicines Take over-the-counter and prescription medicines only as told by your doctor. If you were prescribed an antibiotic medicine, take it as told by your doctor. Do not stop taking the antibiotic even if you start to feel better. Lifestyle  Rest as told by your doctor. Do not use any products that contain nicotine or tobacco, such as cigarettes, e-cigarettes, and chewing tobacco. If you need help quitting, ask your doctor. Do not drink alcohol. Make lifestyle changes as told by your doctor. These may include: Getting regular exercise. Ask your doctor what activities are safe for you. Eating a heart-healthy diet. A diet and nutrition specialist (dietitian) can help you to learn healthy eating options. Staying at a healthy weight. Treating diabetes or high blood pressure, if needed. Lowering your stress. Activities such as yoga and relaxation techniques  can help. General instructions Pay attention to any changes in your symptoms. Tell your doctor about them or any new symptoms. Avoid any activities that cause chest pain. Keep all follow-up visits as told by your doctor. This is important. You may need more testing if your chest pain does not go away. Contact a doctor if: Your chest pain does not go away. You feel depressed. You have a fever. Get help right away if: Your chest pain is worse. You have a cough that gets worse, or you cough up blood. You have very bad (severe) pain in your belly (abdomen). You pass out (faint). You have either of these for no clear reason: Sudden chest discomfort. Sudden discomfort in your arms, back, neck, or jaw. You have shortness of breath at any time. You suddenly start to sweat, or your skin gets clammy. You feel sick to your stomach (nauseous). You throw up (vomit). You suddenly feel lightheaded or dizzy. You feel very weak or tired. Your heart starts to beat fast, or it feels like it is skipping beats. These symptoms may be an emergency. Do not wait to see if the symptoms will go away. Get medical help right away. Call your local emergency services (911 in the [place] Do not drive yourself to the hospital. Summary Chest pain can be caused by many different conditions. The cause may be serious and need treatment right away. If you have chest pain, see your doctor right away. Follow your doctor's instructions for taking medicines and making lifestyle changes. Keep all follow-up visits as told by your doctor. This includes visits for any further   testing if your chest pain does not go away. Be sure to know the signs that show that your condition has become worse. Get help right away if you have these symptoms. This information is not intended to replace advice given to you by your health care provider. Make sure you discuss any questions you have with your health care provider. Document Revised:  01/03/2022 Document Reviewed: 01/03/2022 Elsevier Patient Education  2024 [employer] Inc.  

## 2022-08-29 ENCOUNTER — Ambulatory Visit (INDEPENDENT_AMBULATORY_CARE_PROVIDER_SITE_OTHER): Payer: Medicare PPO | Admitting: Nurse Practitioner

## 2022-08-29 ENCOUNTER — Encounter: Payer: Self-pay | Admitting: Nurse Practitioner

## 2022-08-29 VITALS — BP 138/72 | HR 67 | Temp 97.9°F | Wt 145.6 lb

## 2022-08-29 DIAGNOSIS — R079 Chest pain, unspecified: Secondary | ICD-10-CM | POA: Diagnosis not present

## 2022-08-29 DIAGNOSIS — E7800 Pure hypercholesterolemia, unspecified: Secondary | ICD-10-CM

## 2022-08-29 MED ORDER — EZETIMIBE 10 MG PO TABS: 10.0000 mg | ORAL_TABLET | Freq: Every day | ORAL | 3 refills | Status: DC

## 2022-08-29 MED ORDER — ROSUVASTATIN CALCIUM 40 MG PO TABS: 40.0000 mg | ORAL_TABLET | Freq: Every day | ORAL | 4 refills | Status: DC

## 2022-08-29 NOTE — Progress Notes (Signed)
BP 138/72   Pulse 67   Temp 97.9 F (36.6 C) (Oral)   Wt 145 lb 9.6 oz (66 kg)   LMP  (LMP Unknown)   SpO2 98%   BMI 26.63 kg/m    Subjective:    Patient ID: Tabitha Larson, female    DOB: 1950/10/21, 72 y.o.   MRN: [id number]  HPI: Tabitha Larson is a 72 y.o. female  Chief Complaint  Patient presents with   Chest Pain    Patient is here to follow up on Chest Pain. Patient says the issues is about the same especially when she over does it.    CHEST PAIN Follow-up for chest pain today, started 3 days prior to previous visit.  She reports today it is still present, notices only when doing something strenuous (something she should not be doing).  Does not notice it any other time.  Recent EKG previous visit overall reassuring.  Has about 2 episodes a week. Time since onset: Duration:months Onset: sudden Quality: dull and aching Severity: 3/10 Location: upper left Radiation: none Episode duration: lasts a few minutes and goes away Frequency: intermittent Related to exertion: yes Activity when pain started: does not remember Trauma: no Anxiety/recent stressors: no Aggravating factors: strenuous activity Alleviating factors: stopping what she is doing Status: stable Treatments attempted:  stopping activity that causes it   Current pain status: pain free Shortness of breath: no Cough: no Nausea: no Diaphoresis: no Heartburn: no Palpitations: no   Relevant past medical, surgical, family and social history reviewed and updated as indicated. Interim medical history since our last visit reviewed. Allergies and medications reviewed and updated.  Review of Systems  Constitutional:  Positive for fatigue. Negative for activity change, appetite change, diaphoresis and fever.  Respiratory:  Negative for cough, chest tightness and shortness of breath.   Cardiovascular:  Positive for chest pain. Negative for palpitations and leg swelling.  Gastrointestinal: Negative.    Endocrine: Negative for cold intolerance and heat intolerance.  Neurological: Negative.   Psychiatric/Behavioral:  Negative for decreased concentration, self-injury, sleep disturbance and suicidal ideas. The patient is nervous/anxious.     Per HPI unless specifically indicated above     Objective:    BP 138/72   Pulse 67   Temp 97.9 F (36.6 C) (Oral)   Wt 145 lb 9.6 oz (66 kg)   LMP  (LMP Unknown)   SpO2 98%   BMI 26.63 kg/m   Wt Readings from Last 3 Encounters:  08/29/22 145 lb 9.6 oz (66 kg)  07/23/22 143 lb (64.9 kg)  07/19/22 142 lb 6.4 oz (64.6 kg)    Physical Exam Vitals and nursing note reviewed.  Constitutional:      General: She is awake. She is not in acute distress.    Appearance: She is well-developed and well-groomed. She is not ill-appearing or toxic-appearing.  HENT:     Head: Normocephalic.     Right Ear: Hearing normal.     Left Ear: Hearing normal.  Eyes:     General: Lids are normal.        Right eye: No discharge.        Left eye: No discharge.     Conjunctiva/sclera: Conjunctivae normal.     Pupils: Pupils are equal, round, and reactive to light.  Neck:     Thyroid: No thyromegaly.     Vascular: Carotid bruit (right, soft) present.  Cardiovascular:     Rate and Rhythm: Normal rate and regular  rhythm. No extrasystoles are present.    Heart sounds: Normal heart sounds. No murmur heard.    No gallop.  Pulmonary:     Effort: Pulmonary effort is normal. No accessory muscle usage or respiratory distress.     Breath sounds: Normal breath sounds.  Abdominal:     General: Bowel sounds are normal.     Palpations: Abdomen is soft.  Musculoskeletal:     Cervical back: Normal range of motion and neck supple.     Right lower leg: No edema.     Left lower leg: No edema.  Lymphadenopathy:     Cervical: No cervical adenopathy.  Skin:    General: Skin is warm and dry.  Neurological:     Mental Status: She is alert and oriented to person, place, and  time.     Cranial Nerves: Cranial nerves 2-12 are intact.     Motor: Tremor (bilateral hands, mild at rest) present. No weakness.     Coordination: Coordination is intact.     Gait: Gait is intact.     Deep Tendon Reflexes: Reflexes are normal and symmetric.     Reflex Scores:      Brachioradialis reflexes are 2+ on the right side and 2+ on the left side.      Patellar reflexes are 2+ on the right side and 2+ on the left side. Psychiatric:        Attention and Perception: Attention normal.        Mood and Affect: Mood normal.        Speech: Speech normal.        Behavior: Behavior normal. Behavior is cooperative.        Thought Content: Thought content normal.     Results for orders placed or performed in visit on 07/18/22  HgB A1c  Result Value Ref Range   Hgb A1c MFr Bld 6.0 (H) 4.8 - 5.6 %   Est. average glucose Bld gHb Est-mCnc 126 mg/dL  Comprehensive metabolic panel  Result Value Ref Range   Glucose 91 70 - 99 mg/dL   BUN 9 8 - 27 mg/dL   Creatinine, Ser 7.84 0.57 - 1.00 mg/dL   eGFR 87 >69 GE/XBM/8.41   BUN/Creatinine Ratio 12 12 - 28   Sodium 139 134 - 144 mmol/L   Potassium 4.2 3.5 - 5.2 mmol/L   Chloride 103 96 - 106 mmol/L   CO2 23 20 - 29 mmol/L   Calcium 9.2 8.7 - 10.3 mg/dL   Total Protein 6.7 6.0 - 8.5 g/dL   Albumin 4.5 3.8 - 4.8 g/dL   Globulin, Total 2.2 1.5 - 4.5 g/dL   Albumin/Globulin Ratio 2.0 1.2 - 2.2   Bilirubin Total 0.3 0.0 - 1.2 mg/dL   Alkaline Phosphatase 77 44 - 121 IU/L   AST 21 0 - 40 IU/L   ALT 22 0 - 32 IU/L  Lipid Panel w/o Chol/HDL Ratio  Result Value Ref Range   Cholesterol, Total 155 100 - 199 mg/dL   Triglycerides 93 0 - 149 mg/dL   HDL 44 >32 mg/dL   VLDL Cholesterol Cal 17 5 - 40 mg/dL   LDL Chol Calc (NIH) 94 0 - 99 mg/dL  VITAMIN D 25 Hydroxy (Vit-D Deficiency, Fractures)  Result Value Ref Range   Vit D, 25-Hydroxy 26.0 (L) 30.0 - 100.0 ng/mL  Vitamin B12  Result Value Ref Range   Vitamin B-12 868 232 - 1,245 pg/mL   Magnesium  Result Value Ref  Range   Magnesium 2.2 1.6 - 2.3 mg/dL      Assessment & Plan:   Problem List Items Addressed This Visit       Other   Chest pain - Primary    Intermittent with increased exertion over past month.  EKG reassuring last visit, similar to previous.  No red flag symptoms presenting with pain.  Discussed at length with patient, since this is still ongoing will place referral to cardiology for further recommendations and order echo to further assess heart function.  Recent labs overall reassuring with exception of LDL in 90 range, will add on Zetia.  She is aware if any worsening to alert PCP immediately and we discussed red flag symptoms to immediately go to ER for.  Do not suspect this is GERD or anxiety related, as episodes only present with increased exertion.      Relevant Orders   Ambulatory referral to Cardiology   ECHOCARDIOGRAM COMPLETE   Hypercholesterolemia    Chronic, ongoing.  Continue current medication regimen, with exception of adding on Zetia, and adjust as needed. Lipid panel and CMP today.  Recent LDL in 90 range, would like to get <70.  Educated patient on Zetia and use.      Relevant Medications   rosuvastatin (CRESTOR) 40 MG tablet   ezetimibe (ZETIA) 10 MG tablet     Follow up plan: Return in about 8 weeks (around 10/24/2022) for Chest Pain.

## 2022-08-29 NOTE — Assessment & Plan Note (Signed)
Chronic, ongoing.  Continue current medication regimen, with exception of adding on Zetia, and adjust as needed. Lipid panel and CMP today.  Recent LDL in 90 range, would like to get <70.  Educated patient on Zetia and use.

## 2022-08-29 NOTE — Assessment & Plan Note (Addendum)
Intermittent with increased exertion over past month.  EKG reassuring last visit, similar to previous.  No red flag symptoms presenting with pain.  Discussed at length with patient, since this is still ongoing will place referral to cardiology for further recommendations and order echo to further assess heart function.  Recent labs overall reassuring with exception of LDL in 90 range, will add on Zetia.  She is aware if any worsening to alert PCP immediately and we discussed red flag symptoms to immediately go to ER for.  Do not suspect this is GERD or anxiety related, as episodes only present with increased exertion.

## 2022-08-30 ENCOUNTER — Ambulatory Visit
Admission: RE | Admit: 2022-08-30 | Discharge: 2022-08-30 | Disposition: A | Discharge status: Home / Self Care | Payer: Medicare PPO | Source: Ambulatory Visit | Attending: Nurse Practitioner | Admitting: Nurse Practitioner

## 2022-08-30 ENCOUNTER — Encounter: Payer: Self-pay | Admitting: Nurse Practitioner

## 2022-08-30 DIAGNOSIS — M85851 Other specified disorders of bone density and structure, right thigh: Secondary | ICD-10-CM | POA: Diagnosis not present

## 2022-08-30 DIAGNOSIS — M810 Age-related osteoporosis without current pathological fracture: Secondary | ICD-10-CM | POA: Diagnosis not present

## 2022-08-30 NOTE — Progress Notes (Signed)
Good afternoon, please alert Tabitha Larson her bone density did return and has shown some progression from osteopenia (thinning of bone) to osteoporosis (more fragile bone).  She is scheduled to see me on 10/24/22, we will discuss treatment options for this at that visit.  At this time continue Vitamin D and ensure daily exercise to help keep muscles strong.

## 2022-10-20 NOTE — Patient Instructions (Signed)
Nonspecific Chest Pain Chest pain can be caused by many different conditions. Some causes of chest pain can be life-threatening. These will require treatment right away. Serious causes of chest pain include: Heart attack. A tear in the body's main blood vessel. Redness and swelling (inflammation) around your heart. Blood clot in your lungs. Other causes of chest pain may not be so serious. These include: Heartburn. Anxiety or stress. Damage to bones or muscles in your chest. Lung infections. Chest pain can feel like: Pain or discomfort in your chest. Crushing, pressure, aching, or squeezing pain. Burning or tingling. Dull or sharp pain that is worse when you move, cough, or take a deep breath. Pain or discomfort that is also felt in your back, neck, jaw, shoulder, or arm, or pain that spreads to any of these areas. It is hard to know whether your pain is caused by something that is serious or something that is not so serious. So it is important to see your doctor right away if you have chest pain. Follow these instructions at home: Medicines Take over-the-counter and prescription medicines only as told by your doctor. If you were prescribed an antibiotic medicine, take it as told by your doctor. Do not stop taking the antibiotic even if you start to feel better. Lifestyle  Rest as told by your doctor. Do not use any products that contain nicotine or tobacco, such as cigarettes, e-cigarettes, and chewing tobacco. If you need help quitting, ask your doctor. Do not drink alcohol. Make lifestyle changes as told by your doctor. These may include: Getting regular exercise. Ask your doctor what activities are safe for you. Eating a heart-healthy diet. A diet and nutrition specialist (dietitian) can help you to learn healthy eating options. Staying at a healthy weight. Treating diabetes or high blood pressure, if needed. Lowering your stress. Activities such as yoga and relaxation techniques  can help. General instructions Pay attention to any changes in your symptoms. Tell your doctor about them or any new symptoms. Avoid any activities that cause chest pain. Keep all follow-up visits as told by your doctor. This is important. You may need more testing if your chest pain does not go away. Contact a doctor if: Your chest pain does not go away. You feel depressed. You have a fever. Get help right away if: Your chest pain is worse. You have a cough that gets worse, or you cough up blood. You have very bad (severe) pain in your belly (abdomen). You pass out (faint). You have either of these for no clear reason: Sudden chest discomfort. Sudden discomfort in your arms, back, neck, or jaw. You have shortness of breath at any time. You suddenly start to sweat, or your skin gets clammy. You feel sick to your stomach (nauseous). You throw up (vomit). You suddenly feel lightheaded or dizzy. You feel very weak or tired. Your heart starts to beat fast, or it feels like it is skipping beats. These symptoms may be an emergency. Do not wait to see if the symptoms will go away. Get medical help right away. Call your local emergency services (911 in the [place] Do not drive yourself to the hospital. Summary Chest pain can be caused by many different conditions. The cause may be serious and need treatment right away. If you have chest pain, see your doctor right away. Follow your doctor's instructions for taking medicines and making lifestyle changes. Keep all follow-up visits as told by your doctor. This includes visits for any further   testing if your chest pain does not go away. Be sure to know the signs that show that your condition has become worse. Get help right away if you have these symptoms. This information is not intended to replace advice given to you by your health care provider. Make sure you discuss any questions you have with your health care provider. Document Revised:  01/03/2022 Document Reviewed: 01/03/2022 Elsevier Patient Education  2024 [employer] Inc.  

## 2022-10-22 ENCOUNTER — Other Ambulatory Visit: Payer: Self-pay | Admitting: Nurse Practitioner

## 2022-10-23 NOTE — Telephone Encounter (Signed)
Requested Prescriptions  Pending Prescriptions Disp Refills   pantoprazole (PROTONIX) 20 MG tablet [Pharmacy Med Name: Pantoprazole Sodium 20 MG Oral Tablet Delayed Release] 90 tablet 0    Sig: Take 1 tablet by mouth once daily     Gastroenterology: Proton Pump Inhibitors Passed - 10/22/2022  5:07 PM      Passed - Valid encounter within last 12 months    Recent Outpatient Visits           1 month ago Chest pain, unspecified type   [place] [facility] [place], [place] T, NP   3 months ago Severe episode of recurrent major depressive disorder, without psychotic features (HCC)   [place] [facility] [place], Corrie Dandy T, NP   9 months ago B12 neuropathy (HCC)   [place] [facility] [place], Corrie Dandy T, NP   1 year ago B12 neuropathy (HCC)   [place] [facility] [place], [place] T, NP   1 year ago Severe episode of recurrent major depressive disorder, without psychotic features (HCC)   [place] [facility] [place], Dorie Rank, NP       Future Appointments             Tomorrow Marjie Skiff, NP  [facility]   In 2 weeks [place], Arlys John, MD [facility]

## 2022-10-24 ENCOUNTER — Encounter: Payer: Self-pay | Admitting: Nurse Practitioner

## 2022-10-24 ENCOUNTER — Ambulatory Visit: Payer: Medicare PPO | Admitting: Nurse Practitioner

## 2022-10-24 VITALS — BP 126/80 | HR 66 | Temp 98.4°F | Ht 62.0 in | Wt 144.0 lb

## 2022-10-24 DIAGNOSIS — R079 Chest pain, unspecified: Secondary | ICD-10-CM | POA: Diagnosis not present

## 2022-10-24 DIAGNOSIS — R0989 Other specified symptoms and signs involving the circulatory and respiratory systems: Secondary | ICD-10-CM | POA: Diagnosis not present

## 2022-10-24 DIAGNOSIS — M810 Age-related osteoporosis without current pathological fracture: Secondary | ICD-10-CM | POA: Diagnosis not present

## 2022-10-24 MED ORDER — ALENDRONATE SODIUM 70 MG PO TABS
70.0000 mg | ORAL_TABLET | ORAL | 11 refills | Status: DC
Start: 2022-10-24 — End: 2023-12-16

## 2022-10-24 NOTE — Assessment & Plan Note (Signed)
Imaging last = 03/22/20 imaging = Moderate amount of bilateral atherosclerotic plaque, not resulting in a hemodynamically significant stenosis.  No current symptoms, although is slightly louder.  Will repeat doppler.  Continue statin therapy.

## 2022-10-24 NOTE — Assessment & Plan Note (Signed)
Intermittent with increased exertion over past months.  EKG reassuring last visit, similar to previous.  No red flag symptoms presenting with pain.  She is scheduled to see cardiology. Will check on past echo order and get scheduled.  Recommend she take both Rosuvastatin and Zetia.  She is aware if any worsening to alert PCP immediately and we discussed red flag symptoms to immediately go to ER for.  Do not suspect this is GERD or anxiety related, as episodes only present with increased exertion.

## 2022-10-24 NOTE — Progress Notes (Signed)
BP 126/80 (BP Location: Left Arm, Patient Position: Sitting, Cuff Size: Normal)   Pulse 66   Temp 98.4 F (36.9 C) (Oral)   Ht 5\' 2"  (1.575 m)   Wt 144 lb (65.3 kg)   LMP  (LMP Unknown)   SpO2 97%   BMI 26.34 kg/m    Subjective:    Patient ID: Tabitha Larson, female    DOB: 08/31/1950, 72 y.o.   MRN: [id number]  HPI: Tabitha Larson is a 72 y.o. female  Chief Complaint  Patient presents with   Chest Pain    8 week f/up   CHEST PAIN Follow-up for chest pain today, initially seen for this on 07/18/22 and then returned 08/29/22 -- at that time referral to cardiology was placed + order for Echo and added on Zetia for HLD.  She has no attended cardiology visit or gone for echo at this time.  She is going to see cardiology on September 5th. Has been taking Zetia, but not Rosuvastatin did not realize to take both.  Reports chest pain still presents with no worsening, notices only when doing something strenuous + occasionally notices if sitting still.  Recent EKG previous visit overall reassuring.  Has about 2 episodes a week. Duration:months Onset: sudden Quality: dull and aching Severity: 2-3/10 Location: upper left Radiation: none Episode duration: lasts a few minutes and goes away Frequency: intermittent Related to exertion: yes Activity when pain started: does not remember Trauma: no Anxiety/recent stressors: no Aggravating factors: strenuous activity Alleviating factors: nothing Status: stable with no worsening Treatments attempted: stopping activity she is doing Current pain status: pain free Shortness of breath: if really active Cough: no Nausea: no Diaphoresis: no Heartburn: no Palpitations: no   OSTEOPOROSIS On DEXA 08/30/22 her T-score had progressed from -1.9 in 2019 to now  -2.5. Satisfied with current treatment?: yes Medication side effects: no Medication compliance: good compliance Past osteoporosis medications/treatments: none Adequate calcium &  vitamin D: yes Intolerance to bisphosphonates:no Weight bearing exercises: yes   Relevant past medical, surgical, family and social history reviewed and updated as indicated. Interim medical history since our last visit reviewed. Allergies and medications reviewed and updated.  Review of Systems  Constitutional:  Negative for activity change, appetite change, diaphoresis, fatigue and fever.  Respiratory:  Negative for cough, chest tightness and shortness of breath.   Cardiovascular:  Positive for chest pain. Negative for palpitations and leg swelling.  Gastrointestinal: Negative.   Endocrine: Negative for cold intolerance and heat intolerance.  Neurological: Negative.   Psychiatric/Behavioral:  Negative for decreased concentration, self-injury, sleep disturbance and suicidal ideas. The patient is not nervous/anxious.    Per HPI unless specifically indicated above     Objective:    BP 126/80 (BP Location: Left Arm, Patient Position: Sitting, Cuff Size: Normal)   Pulse 66   Temp 98.4 F (36.9 C) (Oral)   Ht 5\' 2"  (1.575 m)   Wt 144 lb (65.3 kg)   LMP  (LMP Unknown)   SpO2 97%   BMI 26.34 kg/m   Wt Readings from Last 3 Encounters:  10/24/22 144 lb (65.3 kg)  08/29/22 145 lb 9.6 oz (66 kg)  07/23/22 143 lb (64.9 kg)    Physical Exam Vitals and nursing note reviewed.  Constitutional:      General: She is awake. She is not in acute distress.    Appearance: She is well-developed and well-groomed. She is not ill-appearing or toxic-appearing.  HENT:     Head: Normocephalic.  Right Ear: Hearing normal.     Left Ear: Hearing normal.  Eyes:     General: Lids are normal.        Right eye: No discharge.        Left eye: No discharge.     Conjunctiva/sclera: Conjunctivae normal.     Pupils: Pupils are equal, round, and reactive to light.  Neck:     Thyroid: No thyromegaly.     Vascular: Carotid bruit (right, 2/3 slightly louder) present.  Cardiovascular:     Rate and  Rhythm: Normal rate and regular rhythm. No extrasystoles are present.    Heart sounds: Normal heart sounds. No murmur heard.    No gallop.  Pulmonary:     Effort: Pulmonary effort is normal. No accessory muscle usage or respiratory distress.     Breath sounds: Normal breath sounds.  Abdominal:     General: Bowel sounds are normal.     Palpations: Abdomen is soft.  Musculoskeletal:     Cervical back: Normal range of motion and neck supple.     Right lower leg: No edema.     Left lower leg: No edema.  Lymphadenopathy:     Cervical: No cervical adenopathy.  Skin:    General: Skin is warm and dry.  Neurological:     Mental Status: She is alert and oriented to person, place, and time.     Cranial Nerves: Cranial nerves 2-12 are intact.     Motor: Tremor (bilateral hands, mild at rest) present. No weakness.     Coordination: Coordination is intact.     Gait: Gait is intact.     Deep Tendon Reflexes: Reflexes are normal and symmetric.     Reflex Scores:      Brachioradialis reflexes are 2+ on the right side and 2+ on the left side.      Patellar reflexes are 2+ on the right side and 2+ on the left side. Psychiatric:        Attention and Perception: Attention normal.        Mood and Affect: Mood normal.        Speech: Speech normal.        Behavior: Behavior normal. Behavior is cooperative.        Thought Content: Thought content normal.     Results for orders placed or performed in visit on 07/18/22  HgB A1c  Result Value Ref Range   Hgb A1c MFr Bld 6.0 (H) 4.8 - 5.6 %   Est. average glucose Bld gHb Est-mCnc 126 mg/dL  Comprehensive metabolic panel  Result Value Ref Range   Glucose 91 70 - 99 mg/dL   BUN 9 8 - 27 mg/dL   Creatinine, Ser 1.61 0.57 - 1.00 mg/dL   eGFR 87 >09 UE/AVW/0.98   BUN/Creatinine Ratio 12 12 - 28   Sodium 139 134 - 144 mmol/L   Potassium 4.2 3.5 - 5.2 mmol/L   Chloride 103 96 - 106 mmol/L   CO2 23 20 - 29 mmol/L   Calcium 9.2 8.7 - 10.3 mg/dL    Total Protein 6.7 6.0 - 8.5 g/dL   Albumin 4.5 3.8 - 4.8 g/dL   Globulin, Total 2.2 1.5 - 4.5 g/dL   Albumin/Globulin Ratio 2.0 1.2 - 2.2   Bilirubin Total 0.3 0.0 - 1.2 mg/dL   Alkaline Phosphatase 77 44 - 121 IU/L   AST 21 0 - 40 IU/L   ALT 22 0 - 32 IU/L  Lipid Panel w/o  Chol/HDL Ratio  Result Value Ref Range   Cholesterol, Total 155 100 - 199 mg/dL   Triglycerides 93 0 - 149 mg/dL   HDL 44 >95 mg/dL   VLDL Cholesterol Cal 17 5 - 40 mg/dL   LDL Chol Calc (NIH) 94 0 - 99 mg/dL  VITAMIN D 25 Hydroxy (Vit-D Deficiency, Fractures)  Result Value Ref Range   Vit D, 25-Hydroxy 26.0 (L) 30.0 - 100.0 ng/mL  Vitamin B12  Result Value Ref Range   Vitamin B-12 868 232 - 1,245 pg/mL  Magnesium  Result Value Ref Range   Magnesium 2.2 1.6 - 2.3 mg/dL      Assessment & Plan:   Problem List Items Addressed This Visit       Musculoskeletal and Integument   Osteoporosis of lumbar spine    Progression to osteoporosis on scan 08/30/22.  Educated patient on findings and progression.  Discussed with her initial treatment options for this.  She would like to trial Fosamax.  Educated her on this and side effects to notify provider of.  Script sent.  Continue Vit D and Calcium at home + regular exercise.  Repeat DEXA around 08/29/24.      Relevant Medications   alendronate (FOSAMAX) 70 MG tablet     Other   Chest pain - Primary    Intermittent with increased exertion over past months.  EKG reassuring last visit, similar to previous.  No red flag symptoms presenting with pain.  She is scheduled to see cardiology. Will check on past echo order and get scheduled.  Recommend she take both Rosuvastatin and Zetia.  She is aware if any worsening to alert PCP immediately and we discussed red flag symptoms to immediately go to ER for.  Do not suspect this is GERD or anxiety related, as episodes only present with increased exertion.      Right carotid bruit    Imaging last = 03/22/20 imaging = Moderate  amount of bilateral atherosclerotic plaque, not resulting in a hemodynamically significant stenosis.  No current symptoms, although is slightly louder.  Will repeat doppler.  Continue statin therapy.      Relevant Orders   US Carotid Duplex Bilateral      Follow up plan: Return in about 1 month (around 11/27/2022) for OSTEOPOROSIS and CHEST PAIN.

## 2022-10-24 NOTE — Assessment & Plan Note (Signed)
Progression to osteoporosis on scan 08/30/22.  Educated patient on findings and progression.  Discussed with her initial treatment options for this.  She would like to trial Fosamax.  Educated her on this and side effects to notify provider of.  Script sent.  Continue Vit D and Calcium at home + regular exercise.  Repeat DEXA around 08/29/24.

## 2022-11-07 ENCOUNTER — Encounter: Payer: Self-pay | Admitting: Cardiology

## 2022-11-07 ENCOUNTER — Ambulatory Visit: Payer: Medicare PPO | Attending: Cardiology | Admitting: Cardiology

## 2022-11-07 VITALS — BP 140/84 | HR 64 | Ht 62.0 in | Wt 145.0 lb

## 2022-11-07 DIAGNOSIS — R030 Elevated blood-pressure reading, without diagnosis of hypertension: Secondary | ICD-10-CM

## 2022-11-07 DIAGNOSIS — R072 Precordial pain: Secondary | ICD-10-CM

## 2022-11-07 DIAGNOSIS — E7800 Pure hypercholesterolemia, unspecified: Secondary | ICD-10-CM | POA: Diagnosis not present

## 2022-11-07 LAB — BASIC METABOLIC PANEL
BUN/Creatinine Ratio: 19 (ref 12–28)
BUN: 14 mg/dL (ref 8–27)
CO2: 23 mmol/L (ref 20–29)
Calcium: 9.5 mg/dL (ref 8.7–10.3)
Chloride: 103 mmol/L (ref 96–106)
Creatinine, Ser: 0.73 mg/dL (ref 0.57–1.00)
Glucose: 112 mg/dL — ABNORMAL HIGH (ref 70–99)
Potassium: 4.3 mmol/L (ref 3.5–5.2)
Sodium: 141 mmol/L (ref 134–144)
eGFR: 87 mL/min/{1.73_m2} (ref >59)

## 2022-11-07 MED ORDER — METOPROLOL TARTRATE 100 MG PO TABS: 100.0000 mg | ORAL_TABLET | Freq: Once | ORAL | 0 refills | Status: DC

## 2022-11-07 NOTE — Patient Instructions (Signed)
Medication Instructions:   Your physician recommends that you continue on your current medications as directed. Please refer to the Current Medication list given to you today.  *If you need a refill on your cardiac medications before your next appointment, please call your pharmacy*   Lab Work:  Your physician recommends you have labs today - BMP  If you have labs (blood work) drawn today and your tests are completely normal, you will receive your results only by: MyChart Message (if you have MyChart) OR A paper copy in the mail If you have any lab test that is abnormal or we need to change your treatment, we will call you to review the results.   Testing/Procedures:    Your cardiac CT will be scheduled at one of the below locations:    [facility] [address] [place], [place] [address] [phone]  OR   [facility] Heart and Vascular entrance [address] [place], [place] [address] [phone]  Please follow these instructions carefully (unless otherwise directed):  An IV will be required for this test and Nitroglycerin will be given.  Hold all erectile dysfunction medications at least 3 days (72 hrs) prior to test. (Ie viagra, cialis, sildenafil, tadalafil, etc)   On the Night Before the Test: Be sure to Drink plenty of water. Do not consume any caffeinated/decaffeinated beverages or chocolate 12 hours prior to your test. Do not take any antihistamines 12 hours prior to your test.  On the Day of the Test: Drink plenty of water until 1 hour prior to the test. Do not eat any food 1 hour prior to test. You may take your regular medications prior to the test.  Take metoprolol (Lopressor) two hours prior to test. FEMALES- please wear underwire-free bra if available, avoid dresses & tight clothing      After the Test: Drink plenty of water. After receiving IV contrast, you may experience a mild flushed feeling. This  is normal. On occasion, you may experience a mild rash up to 24 hours after the test. This is not dangerous. If this occurs, you can take Benadryl 25 mg and increase your fluid intake. If you experience trouble breathing, this can be serious. If it is severe call 911 IMMEDIATELY. If it is mild, please call our office. If you take any of these medications: Glipizide/Metformin, Avandament, Glucavance, please do not take 48 hours after completing test unless otherwise instructed.  We will call to schedule your test 2-4 weeks out understanding that some insurance companies will need an authorization prior to the service being performed.   For more information and frequently asked questions, please visit our website : [email or web address]  For non-scheduling related questions, please contact the cardiac imaging nurse navigator should you have any questions/concerns: Cardiac Imaging Nurse Navigators Direct Office Dial: [phone]   For scheduling needs, including cancellations and rescheduling, please call [place], [phone].    Follow-Up: At [facility], you and your health needs are our priority.  As part of our continuing mission to provide you with exceptional heart care, we have created designated Provider Care Teams.  These Care Teams include your primary Cardiologist (physician) and Advanced Practice Providers (APPs -  Physician Assistants and Nurse Practitioners) who all work together to provide you with the care you need, when you need it.  We recommend signing up for the patient portal called "MyChart".  Sign up information is provided on this After Visit Summary.  MyChart is used  to connect with patients for Virtual Visits (Telemedicine).  Patients are able to view lab/test results, encounter notes, upcoming appointments, etc.  Non-urgent messages can be sent to your provider as well.   To learn more about what you can do with MyChart, go to  [email or web address].    Your next appointment:    2 months  Provider:   You may see Debbe Odea, MD or one of the following Advanced Practice Providers on your designated Care Team:   Nicolasa Ducking, NP Eula Listen, PA-C Cadence Fransico Michael, PA-C Charlsie Quest, NP

## 2022-11-07 NOTE — Progress Notes (Signed)
Cardiology Office Note:    Date:  11/07/2022   ID:  Tabitha Larson, DOB February 11, 1951, MRN [id number]  PCP:  Marjie Skiff, NP   [facility] Providers Cardiologist:  Debbe Odea, MD     Referring MD: Marjie Skiff, NP   Chief Complaint  Patient presents with   New Patient (Initial Visit)    Referred for cardiac evaluation of chest pain with no cardiac history.  Patient having new chest pain/tightness.  Scheduled for echo tomorrow (11/08/22).    History of Present Illness:    Tabitha Larson is a 72 y.o. female with a hx of hyperlipidemia, GERD presenting with chest pain.  Symptoms of chest pain ongoing over the past 6 months.  Symptoms are not related with exertion.  Describes pain as dullness located in the mid chest area, lasting a few minutes.  Symptoms are different from her typical heartburn.  Denies any personal or family history of heart attacks.  Mother had a stroke.  Denies palpitations or shortness of breath.  Past Medical History:  Diagnosis Date   Ankle fracture    Anxiety    Depression    GERD (gastroesophageal reflux disease)    Hyperlipidemia    Migraine    Osteoporosis    Wears dentures    full upper and lower    Past Surgical History:  Procedure Laterality Date   ANKLE FRACTURE SURGERY     BROW LIFT Bilateral 07/19/2022   Procedure: BLEPHAROPLASTY UPPER EYELID; W/EXCESS SKIN BLEPHAROPTOSIS REPAIR; RESECT EX BILATERAL;  Surgeon: Imagene Riches, MD;  Location: [facility];  Service: Ophthalmology;  Laterality: Bilateral;   CATARACT EXTRACTION Right 01/2014   CATARACT EXTRACTION W/PHACO Left 03/26/2016   Procedure: CATARACT EXTRACTION PHACO AND INTRAOCULAR LENS PLACEMENT (IOC)  left;  Surgeon: Nevada Crane, MD;  Location: [facility];  Service: Ophthalmology;  Laterality: Left;   COLONOSCOPY  02/04/2014   COLONOSCOPY WITH PROPOFOL N/A 07/28/2015   Procedure: COLONOSCOPY WITH PROPOFOL;  Surgeon: Midge Minium, MD;   Location: [facility];  Service: Endoscopy;  Laterality: N/A;  No anesthesia   COLONOSCOPY WITH PROPOFOL N/A 05/16/2020   Procedure: COLONOSCOPY WITH PROPOFOL;  Surgeon: Midge Minium, MD;  Location: [facility];  Service: Endoscopy;  Laterality: N/A;   ESOPHAGOGASTRODUODENOSCOPY (EGD) WITH PROPOFOL N/A 12/17/2018   Procedure: ESOPHAGOGASTRODUODENOSCOPY (EGD) WITH Dilation;  Surgeon: Midge Minium, MD;  Location: [facility];  Service: Endoscopy;  Laterality: N/A;  requests mid-morning arrival   MANDIBLE SURGERY     POLYPECTOMY  07/28/2015   Procedure: POLYPECTOMY;  Surgeon: Midge Minium, MD;  Location: [facility];  Service: Endoscopy;;    Current Medications: Current Meds  Medication Sig   alendronate (FOSAMAX) 70 MG tablet Take 1 tablet (70 mg total) by mouth every 7 (seven) days. Take with a full glass of water on an empty stomach.  Sit upright for one hour after taking.   DULoxetine (CYMBALTA) 60 MG capsule Take 1 capsule (60 mg total) by mouth daily.   ezetimibe (ZETIA) 10 MG tablet Take 1 tablet (10 mg total) by mouth daily.   metoprolol tartrate (LOPRESSOR) 100 MG tablet Take 1 tablet (100 mg total) by mouth once for 1 dose. TAKE TWO HOURS PRIOR TO CARDIAC CTA   pantoprazole (PROTONIX) 20 MG tablet Take 1 tablet by mouth once daily   rosuvastatin (CRESTOR) 40 MG tablet Take 1 tablet (40 mg total) by mouth daily.   vitamin B-12 (CYANOCOBALAMIN) 1000 MCG tablet Take  1,000 mcg by mouth daily.   VITAMIN D, CHOLECALCIFEROL, PO Take by mouth.     Allergies:   Patient has no known allergies.   Social History   Socioeconomic History   Marital status: Divorced    Spouse name: Not on file   Number of children: Not on file   Years of education: Not on file   Highest education level: High school graduate  Occupational History   Occupation: retired  Tobacco Use   Smoking status: Former    Current packs/day: 0.00    Types: Cigarettes    Quit date: 01/05/1984     Years since quitting: 38.8   Smokeless tobacco: Never  Vaping Use   Vaping status: Never Used  Substance and Sexual Activity   Alcohol use: No    Alcohol/week: 0.0 standard drinks of alcohol   Drug use: No   Sexual activity: Not Currently    Birth control/protection: Post-menopausal  Other Topics Concern   Not on file  Social History Narrative   Niece lives in area   No kids   Has friends close by   [place] live with her currently    Social Determinants of Health   Financial Resource Strain: Low Risk  (07/23/2022)   Overall Financial Resource Strain (CARDIA)    Difficulty of Paying Living Expenses: Not very hard  Food Insecurity: No Food Insecurity (07/23/2022)   Hunger Vital Sign    Worried About Running Out of Food in the Last Year: Never true    Ran Out of Food in the Last Year: Never true  Transportation Needs: No Transportation Needs (07/23/2022)   PRAPARE - Administrator, Civil Service (Medical): No    Lack of Transportation (Non-Medical): No  Physical Activity: Insufficiently Active (07/23/2022)   Exercise Vital Sign    Days of Exercise per Week: 3 days    Minutes of Exercise per Session: 30 min  Stress: No Stress Concern Present (07/23/2022)   [employer] of Occupational Health - Occupational Stress Questionnaire    Feeling of Stress : Only a little  Social Connections: Unknown (07/23/2022)   Social Connection and Isolation Panel [NHANES]    Frequency of Communication with Friends and Family: Twice a week    Frequency of Social Gatherings with Friends and Family: Not on file    Attends Religious Services: More than 4 times per year    Active Member of [employer] or Organizations: No    Attends Banker Meetings: Never    Marital Status: Divorced     Family History: The patient's family history includes Colon cancer in her sister; Hyperlipidemia in her sister; Hypertension in her sister; Prostate cancer in her father; Stroke in her mother.  There is no history of Breast cancer.  ROS:   Please see the history of present illness.     All other systems reviewed and are negative.  EKGs/Labs/Other Studies Reviewed:    The following studies were reviewed today:  EKG Interpretation Date/Time:  Thursday November 07 2022 08:49:29 EDT Ventricular Rate:  64 PR Interval:  142 QRS Duration:  72 QT Interval:  416 QTC Calculation: 429 R Axis:   -24  Text Interpretation: Normal sinus rhythm Normal ECG Confirmed by Debbe Odea ([id number]) on 11/07/2022 8:55:54 AM    Recent Labs: 01/17/2022: Hemoglobin 14.3; Platelets 200 07/18/2022: ALT 22; BUN 9; Creatinine, Ser 0.73; Magnesium 2.2; Potassium 4.2; Sodium 139  Recent Lipid Panel    Component Value Date/Time   CHOL  155 07/18/2022 1040   CHOL 185 01/24/2015 0831   TRIG 93 07/18/2022 1040   TRIG 133 01/24/2015 0831   HDL 44 07/18/2022 1040   CHOLHDL 4.0 01/08/2018 1112   VLDL 27 01/24/2015 0831   LDLCALC 94 07/18/2022 1040     Risk Assessment/Calculations:     HYPERTENSION CONTROL Vitals:   11/07/22 0850 11/07/22 0851  BP: 124/80 (!) 140/84    The patient's blood pressure is elevated above target today.  In order to address the patient's elevated BP: Blood pressure will be monitored at home to determine if medication changes need to be made.            Physical Exam:    VS:  BP (!) 140/84 (BP Location: Right Arm, Patient Position: Sitting, Cuff Size: Normal)   Pulse 64   Ht 5\' 2"  (1.575 m)   Wt 145 lb (65.8 kg)   LMP  (LMP Unknown)   SpO2 97%   BMI 26.52 kg/m     Wt Readings from Last 3 Encounters:  11/07/22 145 lb (65.8 kg)  10/24/22 144 lb (65.3 kg)  08/29/22 145 lb 9.6 oz (66 kg)     GEN:  Well nourished, well developed in no acute distress HEENT: Normal NECK: No JVD; No carotid bruits CARDIAC: RRR, no murmurs, rubs, gallops RESPIRATORY:  Clear to auscultation without rales, wheezing or rhonchi  ABDOMEN: Soft, non-tender,  non-distended MUSCULOSKELETAL:  No edema; No deformity  SKIN: Warm and dry NEUROLOGIC:  Alert and oriented x 3 PSYCHIATRIC:  Normal affect   ASSESSMENT:    1. Precordial pain   2. Pure hypercholesterolemia   3. Elevated BP without diagnosis of hypertension    PLAN:    In order of problems listed above:  Chest pain, risk factors hyperlipidemia.  Echocardiogram scheduled for tomorrow.  Obtain coronary CT. Hyperlipidemia, cholesterol controlled, continue Crestor, Zetia. Elevated BP, usually controlled.  Monitor off BP meds for now.  Follow-up after cardiac testing      Medication Adjustments/Labs and Tests Ordered: Current medicines are reviewed at length with the patient today.  Concerns regarding medicines are outlined above.  Orders Placed This Encounter  Procedures   CT CORONARY MORPH W/CTA COR W/SCORE W/CA W/CM &/OR WO/CM   Basic Metabolic Panel (BMET)   EKG 12-Lead   Meds ordered this encounter  Medications   metoprolol tartrate (LOPRESSOR) 100 MG tablet    Sig: Take 1 tablet (100 mg total) by mouth once for 1 dose. TAKE TWO HOURS PRIOR TO CARDIAC CTA    Dispense:  1 tablet    Refill:  0    Patient Instructions  Medication Instructions:   Your physician recommends that you continue on your current medications as directed. Please refer to the Current Medication list given to you today.  *If you need a refill on your cardiac medications before your next appointment, please call your pharmacy*   Lab Work:  Your physician recommends you have labs today - BMP  If you have labs (blood work) drawn today and your tests are completely normal, you will receive your results only by: MyChart Message (if you have MyChart) OR A paper copy in the mail If you have any lab test that is abnormal or we need to change your treatment, we will call you to review the results.   Testing/Procedures:    Your cardiac CT will be scheduled at one of the below locations:     [facility] [address]  [address] [place], [place] [address] [phone]  OR   [facility] Heart and Vascular entrance [address] [place], [place] [address] [phone]  Please follow these instructions carefully (unless otherwise directed):  An IV will be required for this test and Nitroglycerin will be given.  Hold all erectile dysfunction medications at least 3 days (72 hrs) prior to test. (Ie viagra, cialis, sildenafil, tadalafil, etc)   On the Night Before the Test: Be sure to Drink plenty of water. Do not consume any caffeinated/decaffeinated beverages or chocolate 12 hours prior to your test. Do not take any antihistamines 12 hours prior to your test.  On the Day of the Test: Drink plenty of water until 1 hour prior to the test. Do not eat any food 1 hour prior to test. You may take your regular medications prior to the test.  Take metoprolol (Lopressor) two hours prior to test. FEMALES- please wear underwire-free bra if available, avoid dresses & tight clothing      After the Test: Drink plenty of water. After receiving IV contrast, you may experience a mild flushed feeling. This is normal. On occasion, you may experience a mild rash up to 24 hours after the test. This is not dangerous. If this occurs, you can take Benadryl 25 mg and increase your fluid intake. If you experience trouble breathing, this can be serious. If it is severe call 911 IMMEDIATELY. If it is mild, please call our office. If you take any of these medications: Glipizide/Metformin, Avandament, Glucavance, please do not take 48 hours after completing test unless otherwise instructed.  We will call to schedule your test 2-4 weeks out understanding that some insurance companies will need an authorization prior to the service being performed.   For more information and frequently asked questions, please visit our website :  [email or web address]  For non-scheduling related questions, please contact the cardiac imaging nurse navigator should you have any questions/concerns: Cardiac Imaging Nurse Navigators Direct Office Dial: [phone]   For scheduling needs, including cancellations and rescheduling, please call [place], [phone].    Follow-Up: At [facility], you and your health needs are our priority.  As part of our continuing mission to provide you with exceptional heart care, we have created designated Provider Care Teams.  These Care Teams include your primary Cardiologist (physician) and Advanced Practice Providers (APPs -  Physician Assistants and Nurse Practitioners) who all work together to provide you with the care you need, when you need it.  We recommend signing up for the patient portal called "MyChart".  Sign up information is provided on this After Visit Summary.  MyChart is used to connect with patients for Virtual Visits (Telemedicine).  Patients are able to view lab/test results, encounter notes, upcoming appointments, etc.  Non-urgent messages can be sent to your provider as well.   To learn more about what you can do with MyChart, go to [email or web address].    Your next appointment:    2 months  Provider:   You may see Debbe Odea, MD or one of the following Advanced Practice Providers on your designated Care Team:   Nicolasa Ducking, NP Eula Listen, PA-C Cadence Fransico Michael, PA-C Charlsie Quest, NP    Signed, Debbe Odea, MD  11/07/2022 9:18 AM    [facility]

## 2022-11-08 ENCOUNTER — Ambulatory Visit
Admission: RE | Admit: 2022-11-08 | Discharge: 2022-11-08 | Disposition: A | Discharge status: Home / Self Care | Payer: Medicare PPO | Source: Ambulatory Visit | Attending: Nurse Practitioner | Admitting: Nurse Practitioner

## 2022-11-08 DIAGNOSIS — R079 Chest pain, unspecified: Secondary | ICD-10-CM | POA: Diagnosis not present

## 2022-11-08 DIAGNOSIS — F419 Anxiety disorder, unspecified: Secondary | ICD-10-CM | POA: Insufficient documentation

## 2022-11-08 DIAGNOSIS — E785 Hyperlipidemia, unspecified: Secondary | ICD-10-CM | POA: Insufficient documentation

## 2022-11-08 LAB — ECHOCARDIOGRAM COMPLETE
AR max vel: 2.2 cm2
AV Area VTI: 2.54 cm2
AV Area mean vel: 2.18 cm2
AV Mean grad: 3 mmHg
AV Peak grad: 4.8 mmHg
Ao pk vel: 1.1 m/s
Area-P 1/2: 3.43 cm2
MV VTI: 1.54 cm2
S' Lateral: 2.9 cm

## 2022-11-08 NOTE — Progress Notes (Signed)
Please let Tabitha Larson know her echocardiogram returned and overall pump is very good, there is mild impairment in relaxation (but this is mild).  Remainder of echo is overall stable with no significant abnormal findings to explain chest pain.  Any questions? Keep being amazing!!  Thank you for allowing me to participate in your care.  I appreciate you. Kindest regards, Crews Mccollam

## 2022-11-12 ENCOUNTER — Ambulatory Visit: Payer: Medicare PPO

## 2022-11-12 ENCOUNTER — Telehealth (HOSPITAL_COMMUNITY): Payer: Self-pay | Admitting: Emergency Medicine

## 2022-11-12 NOTE — Telephone Encounter (Signed)
Attempted to call patient regarding upcoming cardiac CT appointment. °Left message on voicemail with name and callback number °Sara Wallace RN Navigator Cardiac Imaging °[facility] °[phone] Office °[phone] Cell ° °

## 2022-11-13 ENCOUNTER — Other Ambulatory Visit: Payer: Self-pay | Admitting: Cardiology

## 2022-11-13 ENCOUNTER — Ambulatory Visit
Admission: RE | Admit: 2022-11-13 | Discharge: 2022-11-13 | Disposition: A | Discharge status: Home / Self Care | Payer: Medicare PPO | Source: Ambulatory Visit | Attending: Cardiology | Admitting: Cardiology

## 2022-11-13 DIAGNOSIS — R931 Abnormal findings on diagnostic imaging of heart and coronary circulation: Secondary | ICD-10-CM | POA: Diagnosis not present

## 2022-11-13 DIAGNOSIS — R079 Chest pain, unspecified: Secondary | ICD-10-CM

## 2022-11-13 DIAGNOSIS — R072 Precordial pain: Secondary | ICD-10-CM

## 2022-11-13 MED ORDER — IOHEXOL 350 MG/ML SOLN
80.0000 mL | Freq: Once | INTRAVENOUS | Status: AC | PRN
  Administered 2022-11-13: 80 mL via INTRAVENOUS

## 2022-11-13 MED ORDER — NITROGLYCERIN 0.4 MG SL SUBL
0.8000 mg | SUBLINGUAL_TABLET | Freq: Once | SUBLINGUAL | Status: AC
  Administered 2022-11-13: 0.8 mg via SUBLINGUAL

## 2022-11-13 NOTE — Progress Notes (Signed)

## 2022-11-18 ENCOUNTER — Ambulatory Visit
Admission: RE | Admit: 2022-11-18 | Discharge: 2022-11-18 | Disposition: A | Discharge status: Home / Self Care | Payer: Medicare PPO | Source: Ambulatory Visit | Attending: Nurse Practitioner | Admitting: Nurse Practitioner

## 2022-11-18 DIAGNOSIS — R0989 Other specified symptoms and signs involving the circulatory and respiratory systems: Secondary | ICD-10-CM | POA: Diagnosis not present

## 2022-11-18 DIAGNOSIS — I6523 Occlusion and stenosis of bilateral carotid arteries: Secondary | ICD-10-CM | POA: Diagnosis not present

## 2022-11-18 NOTE — Progress Notes (Signed)
Good afternoon, please let Tabitha Larson know her imaging returned and carotids remain stable with no changes from previous check.  Still has moderate plaque build up, but less then 50% stenosis (stiffening) of vessels.  Continue Rosuvastatin daily. Keep being amazing!!  Thank you for allowing me to participate in your care.  I appreciate you. Kindest regards, Jaylen Knope

## 2022-11-19 ENCOUNTER — Telehealth: Payer: Self-pay

## 2022-11-19 ENCOUNTER — Other Ambulatory Visit: Payer: Self-pay

## 2022-11-19 ENCOUNTER — Telehealth: Payer: Self-pay | Admitting: Cardiology

## 2022-11-19 MED ORDER — ASPIRIN 81 MG PO TBEC: 81.0000 mg | DELAYED_RELEASE_TABLET | Freq: Every day | ORAL | 0 refills | Status: DC

## 2022-11-19 MED ORDER — ISOSORBIDE MONONITRATE ER 30 MG PO TB24: 15.0000 mg | ORAL_TABLET | Freq: Every day | ORAL | 3 refills | Status: DC

## 2022-11-19 NOTE — Telephone Encounter (Signed)
Spoke with patient and informed her of the results of coronary CT and recommendations from provider as follows:  Debbe Odea, MD  "Coronary CT shows moderate nonsignificant stenosis.  Start aspirin 81 mg, continue Crestor, Zetia.  Start Imdur 15 mg daily for antianginal benefit."  Patient understood with read back

## 2022-11-19 NOTE — Telephone Encounter (Signed)
Pt c/o medication issue:  1. Name of Medication: isosorbide mononitrate (IMDUR) 30 MG 24 hr tablet   2. How are you currently taking this medication (dosage and times per day)?    3. Are you having a reaction (difficulty breathing--STAT)? no  4. What is your medication issue? Patient calling in because she didn't know she was suppose to be on this mediation. Please advise

## 2022-11-19 NOTE — Telephone Encounter (Signed)
Med question. Pt is at pharmacy and asking about a drug she wasn't aware of being called - Isosorbide ordered by cardiologist. Pt stated she doesn't have results of test to check for blockages. Advised ordered by cardiologist and call transferred to  that office.

## 2022-11-23 NOTE — Patient Instructions (Signed)
Osteoporosis  Osteoporosis is when the bones get thin and weak. This can cause your bones to break (fracture) more easily. What are the causes? The exact cause of this condition is not known. What increases the risk? Having family members with this condition. Not eating enough healthy foods. Taking certain medicines. Being female. Being age 74 or older. Smoking or using other products that contain nicotine or tobacco, such as e-cigarettes or chewing tobacco. Not exercising. Being of European or Asian ancestry. Having a small body frame. What are the signs or symptoms? A broken bone might be the first sign, especially if the break results from a fall or injury that usually would not cause a bone to break. Other signs and symptoms include: Pain in the neck or low back. Being hunched over (stooped posture). Getting shorter. How is this treated? Eating more foods with more calcium and vitamin D in them. Doing exercises. Stopping tobacco use. Limiting how much alcohol you drink. Taking medicines to slow bone loss or help make the bones stronger. Taking supplements of calcium and vitamin D every day. Taking medicines to replace chemicals in the body (hormone replacement medicines). Monitoring your levels of calcium and vitamin D. The goal of treatment is to strengthen your bones and lower your risk for a bone break. Follow these instructions at home: Eating and drinking Eat plenty of calcium and vitamin D. These nutrients are good for your bones. Good sources of calcium and vitamin D include: Some fish, such as salmon and tuna. Foods that have calcium and vitamin D added to them (fortified foods), such as some breakfast cereals. Egg yolks. Cheese. Liver.  Activity Do exercises as told by your doctor. Ask your doctor what exercises are safe for you. You should do: Exercises that make your muscles work to hold your body weight up (weight-bearing exercises). These include tai chi,  yoga, and walking. Exercises to make your muscles stronger. One example is lifting weights. Lifestyle Do not drink alcohol if: Your doctor tells you not to drink. You are pregnant, may be pregnant, or are planning to become pregnant. If you drink alcohol: Limit how much you use to: 0-1 drink a day for women. 0-2 drinks a day for men. Know how much alcohol is in your drink. In the [place] one drink equals one 12 oz bottle of beer (355 mL), one 5 oz glass of wine (148 mL), or one 1 oz glass of hard liquor (44 mL). Do not smoke or use any products that contain nicotine or tobacco. If you need help quitting, ask your doctor. Preventing falls Use tools to help you move around (mobility aids) as needed. These include canes, walkers, scooters, and crutches. Keep rooms well-lit. Put away things on the floor that could make you trip. These include cords and rugs. Install safety rails on stairs. Install grab bars in bathrooms. Use rubber mats in slippery areas, like bathrooms. Wear shoes that: Fit you well. Support your feet. Have closed toes. Have rubber soles or low heels. Tell your doctor about all of the medicines you are taking. Some medicines can make you more likely to fall. General instructions Take over-the-counter and prescription medicines only as told by your doctor. Keep all follow-up visits. Contact a doctor if: You have not been tested (screened) for osteoporosis and you are: A woman who is age 6 or older. A man who is age 45 or older. Get help right away if: You fall. You get hurt. Summary Osteoporosis happens when your  bones get thin and weak. Weak bones can break (fracture) more easily. Eat plenty of calcium and vitamin D. These are good for your bones. Tell your doctor about all of the medicines that you take. This information is not intended to replace advice given to you by your health care provider. Make sure you discuss any questions you have with your health care  provider. Document Revised: 08/05/2019 Document Reviewed: 08/05/2019 Elsevier Patient Education  2024 [employer].

## 2022-11-25 ENCOUNTER — Ambulatory Visit: Payer: Medicare PPO | Admitting: Nurse Practitioner

## 2022-11-25 ENCOUNTER — Encounter: Payer: Self-pay | Admitting: Nurse Practitioner

## 2022-11-25 VITALS — BP 120/77 | HR 64 | Temp 98.2°F | Ht 62.0 in | Wt 144.8 lb

## 2022-11-25 DIAGNOSIS — R079 Chest pain, unspecified: Secondary | ICD-10-CM | POA: Diagnosis not present

## 2022-11-25 DIAGNOSIS — M810 Age-related osteoporosis without current pathological fracture: Secondary | ICD-10-CM | POA: Diagnosis not present

## 2022-11-25 DIAGNOSIS — Z23 Encounter for immunization: Secondary | ICD-10-CM | POA: Diagnosis not present

## 2022-11-25 NOTE — Progress Notes (Signed)
BP 120/77   Pulse 64   Temp 98.2 F (36.8 C) (Oral)   Ht 5\' 2"  (1.575 m)   Wt 144 lb 12.8 oz (65.7 kg)   LMP  (LMP Unknown)   SpO2 97%   BMI 26.48 kg/m    Subjective:    Patient ID: Tabitha Larson, female    DOB: 1950/11/03, 72 y.o.   MRN: [id number]  HPI: Tabitha Larson is a 72 y.o. female  Chief Complaint  Patient presents with   Chest Pain    1 month f/up   Osteoporosis    1 month f/up    OSTEOPOROSIS Started Fosamax on 10/24/22 due to osteoporosis noted on screening.  Has no ADR with medication.   Satisfied with current treatment?: yes Medication side effects: no Medication compliance: good compliance Past osteoporosis medications/treatments: none Adequate calcium & vitamin D: yes Intolerance to bisphosphonates:no Weight bearing exercises: yes   CHEST PAIN Saw cardiology on 11/07/22.  Her CT coronary screening did note moderate  insignificant stenosis with recommendation to start 81 MG ASA daily (has not started) and continue current HLD medications + take Imdur 15 MG daily for angina, has started taking.  She reports chest pain is improved, this started before taking Imdur.  No chest pain in 2-3 weeks. Time since onset: Duration:months Onset: gradual Quality: dull, aching, and throbbing Severity: 10/10 Location: upper left Radiation: none Episode duration: 10 seconds Frequency: intermittent Related to exertion: yes Activity when pain started:  Trauma: no Anxiety/recent stressors: no Aggravating factors: exertion Alleviating factors: rest Status: better Treatments attempted: nothing  Current pain status: pain free Shortness of breath: no Cough: no Nausea: no Diaphoresis: no Heartburn: no Palpitations: no   Relevant past medical, surgical, family and social history reviewed and updated as indicated. Interim medical history since our last visit reviewed. Allergies and medications reviewed and updated.  Review of Systems  Constitutional:   Negative for activity change, appetite change, diaphoresis, fatigue and fever.  Respiratory:  Negative for cough, chest tightness and shortness of breath.   Cardiovascular:  Negative for chest pain, palpitations and leg swelling.  Gastrointestinal: Negative.   Endocrine: Negative for cold intolerance and heat intolerance.  Neurological: Negative.   Psychiatric/Behavioral:  Negative for decreased concentration, self-injury, sleep disturbance and suicidal ideas. The patient is not nervous/anxious.     Per HPI unless specifically indicated above     Objective:    BP 120/77   Pulse 64   Temp 98.2 F (36.8 C) (Oral)   Ht 5\' 2"  (1.575 m)   Wt 144 lb 12.8 oz (65.7 kg)   LMP  (LMP Unknown)   SpO2 97%   BMI 26.48 kg/m   Wt Readings from Last 3 Encounters:  11/25/22 144 lb 12.8 oz (65.7 kg)  11/07/22 145 lb (65.8 kg)  10/24/22 144 lb (65.3 kg)    Physical Exam Vitals and nursing note reviewed.  Constitutional:      General: She is awake. She is not in acute distress.    Appearance: She is well-developed and well-groomed. She is not ill-appearing or toxic-appearing.  HENT:     Head: Normocephalic.     Right Ear: Hearing normal.     Left Ear: Hearing normal.  Eyes:     General: Lids are normal.        Right eye: No discharge.        Left eye: No discharge.     Conjunctiva/sclera: Conjunctivae normal.     Pupils: Pupils  are equal, round, and reactive to light.  Neck:     Thyroid: No thyromegaly.     Vascular: Carotid bruit (right, 2/3 slightly louder) present.  Cardiovascular:     Rate and Rhythm: Normal rate and regular rhythm. No extrasystoles are present.    Heart sounds: Normal heart sounds. No murmur heard.    No gallop.  Pulmonary:     Effort: Pulmonary effort is normal. No accessory muscle usage or respiratory distress.     Breath sounds: Normal breath sounds.  Abdominal:     General: Bowel sounds are normal.     Palpations: Abdomen is soft.  Musculoskeletal:      Cervical back: Normal range of motion and neck supple.     Right lower leg: No edema.     Left lower leg: No edema.  Lymphadenopathy:     Cervical: No cervical adenopathy.  Skin:    General: Skin is warm and dry.  Neurological:     Mental Status: She is alert and oriented to person, place, and time.     Cranial Nerves: Cranial nerves 2-12 are intact.     Motor: Tremor (bilateral hands, mild at rest) present. No weakness.     Coordination: Coordination is intact.     Gait: Gait is intact.     Deep Tendon Reflexes: Reflexes are normal and symmetric.     Reflex Scores:      Brachioradialis reflexes are 2+ on the right side and 2+ on the left side.      Patellar reflexes are 2+ on the right side and 2+ on the left side. Psychiatric:        Attention and Perception: Attention normal.        Mood and Affect: Mood normal.        Speech: Speech normal.        Behavior: Behavior normal. Behavior is cooperative.        Thought Content: Thought content normal.     Results for orders placed or performed during the hospital encounter of 11/08/22  ECHOCARDIOGRAM COMPLETE  Result Value Ref Range   Ao pk vel 1.10 m/s   AV Area VTI 2.54 cm2   AR max vel 2.20 cm2   AV Mean grad 3.0 mmHg   AV Peak grad 4.8 mmHg   S' Lateral 2.90 cm   AV Area mean vel 2.18 cm2   Area-P 1/2 3.43 cm2   MV VTI 1.54 cm2   Est EF 60 - 65%       Assessment & Plan:   Problem List Items Addressed This Visit       Musculoskeletal and Integument   Osteoporosis of lumbar spine - Primary    Progression to osteoporosis on scan 08/30/22.  Educated patient on findings and progression.  She is tolerating Fosamax, will continue this for now and monitor closely for ADR.  Educated her on this and side effects to notify provider of.  Continue Vit D and Calcium at home + regular exercise.  Repeat DEXA around 08/29/24.        Other   Chest pain    Improving over past 2-3 weeks.  No red flag symptoms presenting with pain.   Saw cardiology, appreciate there input, and they started Imdur which she is tolerating.  Recommend she start ASA 80 MG as recommended by them.  She is aware if any worsening to alert PCP immediately and we discussed red flag symptoms to immediately go to ER for.  Other Visit Diagnoses     Flu vaccine need       Flu vaccine provided today, educated on this.   Relevant Orders   Flu Vaccine Trivalent High Dose (Fluad) (Completed)        Follow up plan: Return in about 6 months (around 05/25/2023) for Annual Exam.

## 2022-11-25 NOTE — Assessment & Plan Note (Signed)
Improving over past 2-3 weeks.  No red flag symptoms presenting with pain.  Saw cardiology, appreciate there input, and they started Imdur which she is tolerating.  Recommend she start ASA 80 MG as recommended by them.  She is aware if any worsening to alert PCP immediately and we discussed red flag symptoms to immediately go to ER for.

## 2022-11-25 NOTE — Assessment & Plan Note (Signed)
Progression to osteoporosis on scan 08/30/22.  Educated patient on findings and progression.  She is tolerating Fosamax, will continue this for now and monitor closely for ADR.  Educated her on this and side effects to notify provider of.  Continue Vit D and Calcium at home + regular exercise.  Repeat DEXA around 08/29/24.

## 2023-01-07 ENCOUNTER — Ambulatory Visit: Payer: Medicare PPO | Admitting: Cardiology

## 2023-01-09 ENCOUNTER — Ambulatory Visit: Payer: Medicare PPO | Attending: Cardiology | Admitting: Cardiology

## 2023-01-09 ENCOUNTER — Encounter: Payer: Self-pay | Admitting: Cardiology

## 2023-01-09 VITALS — BP 142/82 | HR 70 | Ht 62.0 in | Wt 141.8 lb

## 2023-01-09 DIAGNOSIS — I2510 Atherosclerotic heart disease of native coronary artery without angina pectoris: Secondary | ICD-10-CM

## 2023-01-09 DIAGNOSIS — E7800 Pure hypercholesterolemia, unspecified: Secondary | ICD-10-CM

## 2023-01-09 DIAGNOSIS — R030 Elevated blood-pressure reading, without diagnosis of hypertension: Secondary | ICD-10-CM

## 2023-01-09 MED ORDER — CLOPIDOGREL BISULFATE 75 MG PO TABS: 75.0000 mg | ORAL_TABLET | Freq: Every day | ORAL | 3 refills | Status: DC

## 2023-01-09 MED ORDER — ISOSORBIDE MONONITRATE ER 30 MG PO TB24: 30.0000 mg | ORAL_TABLET | Freq: Every day | ORAL | 3 refills | Status: DC

## 2023-01-09 NOTE — Patient Instructions (Signed)
Medication Instructions:   INCREASE Imdur - Take one tablet ( 30mg ) by mouth daily.  START plavix - Take one tablet ( 75mg ) by mouth daily.   *If you need a refill on your cardiac medications before your next appointment, please call your pharmacy*   Lab Work:  None Ordered  If you have labs (blood work) drawn today and your tests are completely normal, you will receive your results only by: MyChart Message (if you have MyChart) OR A paper copy in the mail If you have any lab test that is abnormal or we need to change your treatment, we will call you to review the results.   Testing/Procedures:  None Ordered   Follow-Up: At [facility], you and your health needs are our priority.  As part of our continuing mission to provide you with exceptional heart care, we have created designated Provider Care Teams.  These Care Teams include your primary Cardiologist (physician) and Advanced Practice Providers (APPs -  Physician Assistants and Nurse Practitioners) who all work together to provide you with the care you need, when you need it.  We recommend signing up for the patient portal called "MyChart".  Sign up information is provided on this After Visit Summary.  MyChart is used to connect with patients for Virtual Visits (Telemedicine).  Patients are able to view lab/test results, encounter notes, upcoming appointments, etc.  Non-urgent messages can be sent to your provider as well.   To learn more about what you can do with MyChart, go to [email or web address].    Your next appointment:   3 month(s)  Provider:   You may see Debbe Odea, MD or one of the following Advanced Practice Providers on your designated Care Team:   Nicolasa Ducking, NP Eula Listen, PA-C Cadence Fransico Michael, PA-C Charlsie Quest, NP Carlos Levering, NP

## 2023-01-09 NOTE — Progress Notes (Signed)
Cardiology Office Note:    Date:  01/09/2023   ID:  Tabitha Larson, DOB 1951-01-10, MRN [id number]  PCP:  Marjie Skiff, NP   [facility] Providers Cardiologist:  Debbe Odea, MD     Referring MD: Marjie Skiff, NP   Chief Complaint  Patient presents with   Follow-up    Discuss cardiac testing results.  Patient denies new or acute cardiac problems/concerns today.    History of Present Illness:    Tabitha Larson is a 72 y.o. female with a hx of hyperlipidemia, GERD presenting with for follow-up.  She was previously seen due to chest pain.  Echocardiogram and coronary CT was obtained to evaluate cardiac etiology.  Coronary CT 11/2022 showed moderate LAD, mild RCA stenosis, FFR CT nonsignificant.  Echo 11/2022 showed normal EF 60 to 65%.  She was started on Imdur 15 mg daily, states symptoms of chest pain have improved although still present sometimes.  Overall doing okay, not on aspirin due to having nausea with aspirin intake.   Past Medical History:  Diagnosis Date   Ankle fracture    Anxiety    Depression    GERD (gastroesophageal reflux disease)    Hyperlipidemia    Migraine    Osteoporosis    Wears dentures    full upper and lower    Past Surgical History:  Procedure Laterality Date   ANKLE FRACTURE SURGERY     BROW LIFT Bilateral 07/19/2022   Procedure: BLEPHAROPLASTY UPPER EYELID; W/EXCESS SKIN BLEPHAROPTOSIS REPAIR; RESECT EX BILATERAL;  Surgeon: Imagene Riches, MD;  Location[facility];  Service: Ophthalmology;  Laterality: Bilateral;   CATARACT EXTRACTION Right 01/2014   CATARACT EXTRACTION W/PHACO Left 03/26/2016   Procedure: CATARACT EXTRACTION PHACO AND INTRAOCULAR LENS PLACEMENT (IOC)  left;  Surgeon: Nevada Crane, MD;  Location: [facility];  Service: Ophthalmology;  Laterality: Left;   COLONOSCOPY  02/04/2014   COLONOSCOPY WITH PROPOFOL N/A 07/28/2015   Procedure: COLONOSCOPY WITH PROPOFOL;  Surgeon:  Midge Minium, MD;  Location: [facility];  Service: Endoscopy;  Laterality: N/A;  No anesthesia   COLONOSCOPY WITH PROPOFOL N/A 05/16/2020   Procedure: COLONOSCOPY WITH PROPOFOL;  Surgeon: Midge Minium, MD;  Location: [facility];  Service: Endoscopy;  Laterality: N/A;   ESOPHAGOGASTRODUODENOSCOPY (EGD) WITH PROPOFOL N/A 12/17/2018   Procedure: ESOPHAGOGASTRODUODENOSCOPY (EGD) WITH Dilation;  Surgeon: Midge Minium, MD;  Location: [facility];  Service: Endoscopy;  Laterality: N/A;  requests mid-morning arrival   MANDIBLE SURGERY     POLYPECTOMY  07/28/2015   Procedure: POLYPECTOMY;  Surgeon: Midge Minium, MD;  Location: [facility];  Service: Endoscopy;;    Current Medications: Current Meds  Medication Sig   alendronate (FOSAMAX) 70 MG tablet Take 1 tablet (70 mg total) by mouth every 7 (seven) days. Take with a full glass of water on an empty stomach.  Sit upright for one hour after taking.   clopidogrel (PLAVIX) 75 MG tablet Take 1 tablet (75 mg total) by mouth daily.   DULoxetine (CYMBALTA) 60 MG capsule Take 1 capsule (60 mg total) by mouth daily.   ezetimibe (ZETIA) 10 MG tablet Take 1 tablet (10 mg total) by mouth daily.   pantoprazole (PROTONIX) 20 MG tablet Take 1 tablet by mouth once daily   rosuvastatin (CRESTOR) 40 MG tablet Take 1 tablet (40 mg total) by mouth daily.   VITAMIN D, CHOLECALCIFEROL, PO Take by mouth.   [DISCONTINUED] isosorbide mononitrate (IMDUR) 30 MG 24 hr  tablet Take 0.5 tablets (15 mg total) by mouth daily.     Allergies:   Patient has no known allergies.   Social History   Socioeconomic History   Marital status: Divorced    Spouse name: Not on file   Number of children: Not on file   Years of education: Not on file   Highest education level: High school graduate  Occupational History   Occupation: retired  Tobacco Use   Smoking status: Former    Current packs/day: 0.00    Types: Cigarettes    Quit date: 01/05/1984    Years  since quitting: 39.0   Smokeless tobacco: Never  Vaping Use   Vaping status: Never Used  Substance and Sexual Activity   Alcohol use: No    Alcohol/week: 0.0 standard drinks of alcohol   Drug use: No   Sexual activity: Not Currently    Birth control/protection: Post-menopausal  Other Topics Concern   Not on file  Social History Narrative   Niece lives in area   No kids   Has friends close by   [place] live with her currently    Social Determinants of Health   Financial Resource Strain: Low Risk  (07/23/2022)   Overall Financial Resource Strain (CARDIA)    Difficulty of Paying Living Expenses: Not very hard  Food Insecurity: No Food Insecurity (07/23/2022)   Hunger Vital Sign    Worried About Running Out of Food in the Last Year: Never true    Ran Out of Food in the Last Year: Never true  Transportation Needs: No Transportation Needs (07/23/2022)   PRAPARE - Administrator, Civil Service (Medical): No    Lack of Transportation (Non-Medical): No  Physical Activity: Insufficiently Active (07/23/2022)   Exercise Vital Sign    Days of Exercise per Week: 3 days    Minutes of Exercise per Session: 30 min  Stress: No Stress Concern Present (07/23/2022)   [employer] of Occupational Health - Occupational Stress Questionnaire    Feeling of Stress : Only a little  Social Connections: Unknown (07/23/2022)   Social Connection and Isolation Panel [NHANES]    Frequency of Communication with Friends and Family: Twice a week    Frequency of Social Gatherings with Friends and Family: Not on file    Attends Religious Services: More than 4 times per year    Active Member of [employer] or Organizations: No    Attends Banker Meetings: Never    Marital Status: Divorced     Family History: The patient's family history includes Colon cancer in her sister; Hyperlipidemia in her sister; Hypertension in her sister; Prostate cancer in her father; Stroke in her mother. There is  no history of Breast cancer.  ROS:   Please see the history of present illness.     All other systems reviewed and are negative.  EKGs/Labs/Other Studies Reviewed:    The following studies were reviewed today:       Recent Labs: 01/17/2022: Hemoglobin 14.3; Platelets 200 07/18/2022: ALT 22; Magnesium 2.2 11/07/2022: BUN 14; Creatinine, Ser 0.73; Potassium 4.3; Sodium 141  Recent Lipid Panel    Component Value Date/Time   CHOL 155 07/18/2022 1040   CHOL 185 01/24/2015 0831   TRIG 93 07/18/2022 1040   TRIG 133 01/24/2015 0831   HDL 44 07/18/2022 1040   CHOLHDL 4.0 01/08/2018 1112   VLDL 27 01/24/2015 0831   LDLCALC 94 07/18/2022 1040     Risk Assessment/Calculations:  Physical Exam:    VS:  BP (!) 142/82 (BP Location: Left Arm, Patient Position: Sitting, Cuff Size: Normal)   Pulse 70   Ht 5\' 2"  (1.575 m)   Wt 141 lb 12.8 oz (64.3 kg)   LMP  (LMP Unknown)   SpO2 97%   BMI 25.94 kg/m     Wt Readings from Last 3 Encounters:  01/09/23 141 lb 12.8 oz (64.3 kg)  11/25/22 144 lb 12.8 oz (65.7 kg)  11/07/22 145 lb (65.8 kg)     GEN:  Well nourished, well developed in no acute distress HEENT: Normal NECK: No JVD; No carotid bruits CARDIAC: RRR, no murmurs, rubs, gallops RESPIRATORY:  Clear to auscultation without rales, wheezing or rhonchi  ABDOMEN: Soft, non-tender, non-distended MUSCULOSKELETAL:  No edema; No deformity  SKIN: Warm and dry NEUROLOGIC:  Alert and oriented x 3 PSYCHIATRIC:  Normal affect   ASSESSMENT:    1. Coronary artery disease involving native heart, unspecified vessel or lesion type, unspecified whether angina present   2. Pure hypercholesterolemia   3. Elevated BP without diagnosis of hypertension    PLAN:    In order of problems listed above:  Nonobstructive CAD, moderate proximal LAD, mild RCA on CCTA 9/24.  Start Plavix 75 mg daily,, continue Crestor 40 mg daily, Zetia 10 mg daily.  Echo with normal EF 60 to 65%.  Chest pain  improved with Imdur.  Increase Imdur to 30 mg daily. Hyperlipidemia, cholesterol controlled, continue Crestor, Zetia. Elevated BP, usually controlled.  Imdur 30 mg daily as above should help with BP also.  Follow-up in 3 months.      Medication Adjustments/Labs and Tests Ordered: Current medicines are reviewed at length with the patient today.  Concerns regarding medicines are outlined above.  No orders of the defined types were placed in this encounter.  Meds ordered this encounter  Medications   isosorbide mononitrate (IMDUR) 30 MG 24 hr tablet    Sig: Take 1 tablet (30 mg total) by mouth daily.    Dispense:  90 tablet    Refill:  3   clopidogrel (PLAVIX) 75 MG tablet    Sig: Take 1 tablet (75 mg total) by mouth daily.    Dispense:  90 tablet    Refill:  3    Patient Instructions  Medication Instructions:   INCREASE Imdur - Take one tablet ( 30mg ) by mouth daily.  START plavix - Take one tablet ( 75mg ) by mouth daily.   *If you need a refill on your cardiac medications before your next appointment, please call your pharmacy*   Lab Work:  None Ordered  If you have labs (blood work) drawn today and your tests are completely normal, you will receive your results only by: MyChart Message (if you have MyChart) OR A paper copy in the mail If you have any lab test that is abnormal or we need to change your treatment, we will call you to review the results.   Testing/Procedures:  None Ordered   Follow-Up: At [facility], you and your health needs are our priority.  As part of our continuing mission to provide you with exceptional heart care, we have created designated Provider Care Teams.  These Care Teams include your primary Cardiologist (physician) and Advanced Practice Providers (APPs -  Physician Assistants and Nurse Practitioners) who all work together to provide you with the care you need, when you need it.  We recommend signing up for the patient portal  called "MyChart".  Sign up information is provided on this After Visit Summary.  MyChart is used to connect with patients for Virtual Visits (Telemedicine).  Patients are able to view lab/test results, encounter notes, upcoming appointments, etc.  Non-urgent messages can be sent to your provider as well.   To learn more about what you can do with MyChart, go to [email or web address].    Your next appointment:   3 month(s)  Provider:   You may see Debbe Odea, MD or one of the following Advanced Practice Providers on your designated Care Team:   Nicolasa Ducking, NP Eula Listen, PA-C Cadence Fransico Michael, PA-C Charlsie Quest, NP Carlos Levering, NP    Signed, Debbe Odea, MD  01/09/2023 11:50 AM    [facility]

## 2023-03-26 ENCOUNTER — Other Ambulatory Visit: Payer: Self-pay | Admitting: Nurse Practitioner

## 2023-03-26 NOTE — Telephone Encounter (Signed)
Requested Prescriptions  Pending Prescriptions Disp Refills   pantoprazole (PROTONIX) 20 MG tablet [Pharmacy Med Name: Pantoprazole Sodium 20 MG Oral Tablet Delayed Release] 90 tablet 0    Sig: Take 1 tablet by mouth once daily     Gastroenterology: Proton Pump Inhibitors Passed - 03/26/2023 11:07 AM      Passed - Valid encounter within last 12 months    Recent Outpatient Visits           4 months ago Osteoporosis of lumbar spine   [place] [facility] [place], Corrie Dandy T, NP   5 months ago Chest pain, unspecified type   [place] [facility] [place], Corrie Dandy T, NP   6 months ago Chest pain, unspecified type   [place] [facility] [place], [place] T, NP   8 months ago Severe episode of recurrent major depressive disorder, without psychotic features (HCC)   [place] [facility] [place], Corrie Dandy T, NP   1 year ago B12 neuropathy (HCC)   [place] [facility] Marjie Skiff, NP       Future Appointments             In 2 weeks Ag[place]Etang, Arlys John, MD [facility] [place]   In 2 months [place], Dorie Rank, NP [facility]

## 2023-04-11 ENCOUNTER — Ambulatory Visit: Payer: Medicare PPO | Attending: Cardiology | Admitting: Cardiology

## 2023-04-11 ENCOUNTER — Encounter: Payer: Self-pay | Admitting: Cardiology

## 2023-04-11 VITALS — BP 126/84 | HR 70 | Ht 62.0 in | Wt 145.0 lb

## 2023-04-11 DIAGNOSIS — I2510 Atherosclerotic heart disease of native coronary artery without angina pectoris: Secondary | ICD-10-CM | POA: Diagnosis not present

## 2023-04-11 DIAGNOSIS — E7800 Pure hypercholesterolemia, unspecified: Secondary | ICD-10-CM | POA: Diagnosis not present

## 2023-04-11 MED ORDER — METOPROLOL SUCCINATE ER 25 MG PO TB24: 25.0000 mg | ORAL_TABLET | Freq: Every day | ORAL | 3 refills | Status: DC

## 2023-04-11 NOTE — Progress Notes (Signed)
 Cardiology Office Note:    Date:  04/11/2023   ID:  [place] [name], DOB 1950-03-12, MRN [id number]  PCP:  Valerio Melanie DASEN, NP   [facility] Providers Cardiologist:  Redell Cave, MD     Referring MD: Valerio Melanie DASEN, NP   No chief complaint on file.   History of Present Illness:    Tabitha Larson is a 73 y.o. female with a hx of nonobstructive CAD (moderate proximal LAD, mild RCA CCTA 9/24 ), hyperlipidemia, GERD presenting with for follow-up.  Previously seen with symptoms of chest pain, Imdur  15 mg daily was started with good effect and eventually increased to 30 mg daily.  Still has occasional chest discomfort.  States not eating healthy.  Endorses eating high calorie diet and sugary foods.    Prior notes/testing Coronary CT 11/2022 showed moderate LAD, mild RCA stenosis, FFR CT nonsignificant. Echo 11/2022 showed normal EF 60 to 65%.   Past Medical History:  Diagnosis Date   Ankle fracture    Anxiety    Depression    GERD (gastroesophageal reflux disease)    Hyperlipidemia    Migraine    Osteoporosis    Wears dentures    full upper and lower    Past Surgical History:  Procedure Laterality Date   ANKLE FRACTURE SURGERY     BROW LIFT Bilateral 07/19/2022   Procedure: BLEPHAROPLASTY UPPER EYELID; W/EXCESS SKIN BLEPHAROPTOSIS REPAIR; RESECT EX BILATERAL;  Surgeon: Ashley Greig HERO, MD;  Location: [facility];  Service: Ophthalmology;  Laterality: Bilateral;   CATARACT EXTRACTION Right 01/2014   CATARACT EXTRACTION W/PHACO Left 03/26/2016   Procedure: CATARACT EXTRACTION PHACO AND INTRAOCULAR LENS PLACEMENT (IOC)  left;  Surgeon: Adine Oneil Novak, MD;  Location: [facility];  Service: Ophthalmology;  Laterality: Left;   COLONOSCOPY  02/04/2014   COLONOSCOPY WITH PROPOFOL  N/A 07/28/2015   Procedure: COLONOSCOPY WITH PROPOFOL ;  Surgeon: Rogelia Copping, MD;  Location: [facility];  Service: Endoscopy;  Laterality: N/A;  No anesthesia    COLONOSCOPY WITH PROPOFOL  N/A 05/16/2020   Procedure: COLONOSCOPY WITH PROPOFOL ;  Surgeon: Copping Rogelia, MD;  Location: [facility] END[facility];  Service: Endoscopy;  Laterality: N/A;   ESOPHAGOGASTRODUODENOSCOPY (EGD) WITH PROPOFOL  N/A 12/17/2018   Procedure: ESOPHAGOGASTRODUODENOSCOPY (EGD) WITH Dilation;  Surgeon: Copping Rogelia, MD;  Location: [facility];  Service: Endoscopy;  Laterality: N/A;  requests mid-morning arrival   MANDIBLE SURGERY     POLYPECTOMY  07/28/2015   Procedure: POLYPECTOMY;  Surgeon: Rogelia Copping, MD;  Location: [facility];  Service: Endoscopy;;    Current Medications: Current Meds  Medication Sig   alendronate  (FOSAMAX ) 70 MG tablet Take 1 tablet (70 mg total) by mouth every 7 (seven) days. Take with a full glass of water  on an empty stomach.  Sit upright for one hour after taking.   clopidogrel  (PLAVIX ) 75 MG tablet Take 1 tablet (75 mg total) by mouth daily.   DULoxetine  (CYMBALTA ) 60 MG capsule Take 1 capsule (60 mg total) by mouth daily.   ezetimibe  (ZETIA ) 10 MG tablet Take 1 tablet (10 mg total) by mouth daily.   metoprolol  succinate (TOPROL  XL) 25 MG 24 hr tablet Take 1 tablet (25 mg total) by mouth daily.   pantoprazole  (PROTONIX ) 20 MG tablet Take 1 tablet by mouth once daily   rosuvastatin  (CRESTOR ) 40 MG tablet Take 1 tablet (40 mg total) by mouth daily.   [DISCONTINUED] VITAMIN D , CHOLECALCIFEROL, PO Take by mouth.     Allergies:  Patient has no known allergies.   Social History   Socioeconomic History   Marital status: Divorced    Spouse name: Not on file   Number of children: Not on file   Years of education: Not on file   Highest education level: High school graduate  Occupational History   Occupation: retired  Tobacco Use   Smoking status: Former    Current packs/day: 0.00    Types: Cigarettes    Quit date: 01/05/1984    Years since quitting: 39.2   Smokeless tobacco: Never  Vaping Use   Vaping status: Never Used  Substance  and Sexual Activity   Alcohol use: No    Alcohol/week: 0.0 standard drinks of alcohol   Drug use: No   Sexual activity: Not Currently    Birth control/protection: Post-menopausal  Other Topics Concern   Not on file  Social History Narrative   Niece lives in area   No kids   Has friends close by   [place] live with her currently    Social Drivers of Health   Financial Resource Strain: Low Risk  (07/23/2022)   Overall Financial Resource Strain (CARDIA)    Difficulty of Paying Living Expenses: Not very hard  Food Insecurity: No Food Insecurity (07/23/2022)   Hunger Vital Sign    Worried About Running Out of Food in the Last Year: Never true    Ran Out of Food in the Last Year: Never true  Transportation Needs: No Transportation Needs (07/23/2022)   PRAPARE - Administrator, Civil Service (Medical): No    Lack of Transportation (Non-Medical): No  Physical Activity: Insufficiently Active (07/23/2022)   Exercise Vital Sign    Days of Exercise per Week: 3 days    Minutes of Exercise per Session: 30 min  Stress: No Stress Concern Present (07/23/2022)   [employer] of Occupational Health - Occupational Stress Questionnaire    Feeling of Stress : Only a little  Social Connections: Unknown (07/23/2022)   Social Connection and Isolation Panel [NHANES]    Frequency of Communication with Friends and Family: Twice a week    Frequency of Social Gatherings with Friends and Family: Not on file    Attends Religious Services: More than 4 times per year    Active Member of [employer] or Organizations: No    Attends Banker Meetings: Never    Marital Status: Divorced     Family History: The patient's family history includes Colon cancer in her sister; Hyperlipidemia in her sister; Hypertension in her sister; Prostate cancer in her father; Stroke in her mother. There is no history of Breast cancer.  ROS:   Please see the history of present illness.     All other systems  reviewed and are negative.  EKGs/Labs/Other Studies Reviewed:    The following studies were reviewed today:       Recent Labs: 07/18/2022: ALT 22; Magnesium 2.2 11/07/2022: BUN 14; Creatinine, Ser 0.73; Potassium 4.3; Sodium 141  Recent Lipid Panel    Component Value Date/Time   CHOL 155 07/18/2022 1040   CHOL 185 01/24/2015 0831   TRIG 93 07/18/2022 1040   TRIG 133 01/24/2015 0831   HDL 44 07/18/2022 1040   CHOLHDL 4.0 01/08/2018 1112   VLDL 27 01/24/2015 0831   LDLCALC 94 07/18/2022 1040     Risk Assessment/Calculations:      Physical Exam:    VS:  BP 126/84   Pulse 70   Ht 5' 2 (  1.575 m)   Wt 145 lb (65.8 kg)   LMP  (LMP Unknown)   SpO2 94%   BMI 26.52 kg/m     Wt Readings from Last 3 Encounters:  04/11/23 145 lb (65.8 kg)  01/09/23 141 lb 12.8 oz (64.3 kg)  11/25/22 144 lb 12.8 oz (65.7 kg)     GEN:  Well nourished, well developed in no acute distress HEENT: Normal NECK: No JVD; No carotid bruits CARDIAC: RRR, no murmurs, rubs, gallops RESPIRATORY:  Clear to auscultation without rales, wheezing or rhonchi  ABDOMEN: Soft, non-tender, non-distended MUSCULOSKELETAL:  No edema; No deformity  SKIN: Warm and dry NEUROLOGIC:  Alert and oriented x 3 PSYCHIATRIC:  Normal affect   ASSESSMENT:    1. Coronary artery disease involving native heart, unspecified vessel or lesion type, unspecified whether angina present   2. Pure hypercholesterolemia     PLAN:    In order of problems listed above:  Nonobstructive CAD, moderate proximal LAD, mild RCA stenosis on CCTA 9/24.  Chest pain improved but still present.  Start Toprol -XL 25 mg daily, continue Imdur  for 30 mg daily, Plavix  75 mg daily, Crestor  40 mg daily, Zetia  10 mg daily.  Echo with normal EF 60 to 65%.  Will consider left heart cath if symptoms persist. Hyperlipidemia, cholesterol controlled, continue Crestor , Zetia .  Follow-up in 3 months.     Medication Adjustments/Labs and Tests  Ordered: Current medicines are reviewed at length with the patient today.  Concerns regarding medicines are outlined above.  No orders of the defined types were placed in this encounter.  Meds ordered this encounter  Medications   metoprolol  succinate (TOPROL  XL) 25 MG 24 hr tablet    Sig: Take 1 tablet (25 mg total) by mouth daily.    Dispense:  90 tablet    Refill:  3    Patient Instructions  Medication Instructions:   START Metoprolol  Succinate - Take one tablet ( 25mg ) by mouth daily.   *If you need a refill on your cardiac medications before your next appointment, please call your pharmacy*   Lab Work:  None Ordered  If you have labs (blood work) drawn today and your tests are completely normal, you will receive your results only by: MyChart Message (if you have MyChart) OR A paper copy in the mail If you have any lab test that is abnormal or we need to change your treatment, we will call you to review the results.   Testing/Procedures:  None Ordered    Follow-Up: At [facility], you and your health needs are our priority.  As part of our continuing mission to provide you with exceptional heart care, we have created designated Provider Care Teams.  These Care Teams include your primary Cardiologist (physician) and Advanced Practice Providers (APPs -  Physician Assistants and Nurse Practitioners) who all work together to provide you with the care you need, when you need it.  We recommend signing up for the patient portal called MyChart.  Sign up information is provided on this After Visit Summary.  MyChart is used to connect with patients for Virtual Visits (Telemedicine).  Patients are able to view lab/test results, encounter notes, upcoming appointments, etc.  Non-urgent messages can be sent to your provider as well.   To learn more about what you can do with MyChart, go to [email or web address].    Your next appointment:   3 month(s)  Provider:   You  may see Redell Cave, MD or one of  the following Advanced Practice Providers on your designated Care Team:   Lonni Meager, NP Bernardino Bring, PA-C Cadence Franchester, PA-C Tylene Lunch, NP Barnie Hila, NP    Signed, Redell Cave, MD  04/11/2023 11:52 AM    [facility]

## 2023-04-11 NOTE — Patient Instructions (Signed)
 Medication Instructions:   START Metoprolol Succinate - Take one tablet (25mg ) by mouth daily.   *If you need a refill on your cardiac medications before your next appointment, please call your pharmacy*   Lab Work:  None Ordered  If you have labs (blood work) drawn today and your tests are completely normal, you will receive your results only by: MyChart Message (if you have MyChart) OR A paper copy in the mail If you have any lab test that is abnormal or we need to change your treatment, we will call you to review the results.   Testing/Procedures:  None Ordered   Follow-Up: At [facility], you and your health needs are our priority.  As part of our continuing mission to provide you with exceptional heart care, we have created designated Provider Care Teams.  These Care Teams include your primary Cardiologist (physician) and Advanced Practice Providers (APPs -  Physician Assistants and Nurse Practitioners) who all work together to provide you with the care you need, when you need it.  We recommend signing up for the patient portal called "MyChart".  Sign up information is provided on this After Visit Summary.  MyChart is used to connect with patients for Virtual Visits (Telemedicine).  Patients are able to view lab/test results, encounter notes, upcoming appointments, etc.  Non-urgent messages can be sent to your provider as well.   To learn more about what you can do with MyChart, go to [email or web address].    Your next appointment:   3 month(s)  Provider:   You may see Debbe Odea, MD or one of the following Advanced Practice Providers on your designated Care Team:   Nicolasa Ducking, NP Eula Listen, PA-C Cadence Fransico Michael, PA-C Charlsie Quest, NP Carlos Levering, NP

## 2023-05-24 NOTE — Patient Instructions (Signed)
 Be Involved in Caring For Your Health:  Taking Medications When medications are taken as directed, they can greatly improve your health. But if they are not taken as prescribed, they may not work. In some cases, not taking them correctly can be harmful. To help ensure your treatment remains effective and safe, understand your medications and how to take them. Bring your medications to each visit for review by your provider.  Your lab results, notes, and after visit summary will be available on My Chart. We strongly encourage you to use this feature. If lab results are abnormal the clinic will contact you with the appropriate steps. If the clinic does not contact you assume the results are satisfactory. You can always view your results on My Chart. If you have questions regarding your health or results, please contact the clinic during office hours. You can also ask questions on My Chart.  We at [facility] are grateful that you chose [place] to provide your care. We strive to provide evidence-based and compassionate care and are always looking for feedback. If you get a survey from the clinic please complete this so we can hear your opinions.  Healthy Eating, Adult Healthy eating may help you get and keep a healthy body weight, reduce the risk of chronic disease, and live a long and productive life. It is important to follow a healthy eating pattern. Your nutritional and calorie needs should be met mainly by different nutrient-rich foods. What are tips for following this plan? Reading food labels Read labels and choose the following: Reduced or low sodium products. Juices with 100% fruit juice. Foods with low saturated fats (<3 g per serving) and high polyunsaturated and monounsaturated fats. Foods with whole grains, such as whole wheat, cracked wheat, brown rice, and wild rice. Whole grains that are fortified with folic acid. This is recommended for females who are pregnant or who want  to become pregnant. Read labels and do not eat or drink the following: Foods or drinks with added sugars. These include foods that contain brown sugar, corn sweetener, corn syrup, dextrose, fructose, glucose, high-fructose corn syrup, honey, invert sugar, lactose, malt syrup, maltose, molasses, raw sugar, sucrose, trehalose, or turbinado sugar. Limit your intake of added sugars to less than 10% of your total daily calories. Do not eat more than the following amounts of added sugar per day: 6 teaspoons (25 g) for females. 9 teaspoons (38 g) for males. Foods that contain processed or refined starches and grains. Refined grain products, such as white flour, degermed cornmeal, white bread, and white rice. Shopping Choose nutrient-rich snacks, such as vegetables, whole fruits, and nuts. Avoid high-calorie and high-sugar snacks, such as potato chips, fruit snacks, and candy. Use oil-based dressings and spreads on foods instead of solid fats such as butter, margarine, sour cream, or cream cheese. Limit pre-made sauces, mixes, and "instant" products such as flavored rice, instant noodles, and ready-made pasta. Try more plant-protein sources, such as tofu, tempeh, black beans, edamame, lentils, nuts, and seeds. Explore eating plans such as the Mediterranean diet or vegetarian diet. Try heart-healthy dips made with beans and healthy fats like hummus and guacamole. Vegetables go great with these. Cooking Use oil to saut or stir-fry foods instead of solid fats such as butter, margarine, or lard. Try baking, boiling, grilling, or broiling instead of frying. Remove the fatty part of meats before cooking. Steam vegetables in water or broth. Meal planning  At meals, imagine dividing your plate into fourths: One-half of  your plate is fruits and vegetables. One-fourth of your plate is whole grains. One-fourth of your plate is protein, especially lean meats, poultry, eggs, tofu, beans, or nuts. Include  low-fat dairy as part of your daily diet. Lifestyle Choose healthy options in all settings, including home, work, school, restaurants, or stores. Prepare your food safely: Wash your hands after handling raw meats. Where you prepare food, keep surfaces clean by regularly washing with hot, soapy water. Keep raw meats separate from ready-to-eat foods, such as fruits and vegetables. Cook seafood, meat, poultry, and eggs to the recommended temperature. Get a food thermometer. Store foods at safe temperatures. In general: Keep cold foods at 76F (4.4C) or below. Keep hot foods at 176F (60C) or above. Keep your freezer at [facility] (-17.8C) or below. Foods are not safe to eat if they have been between the temperatures of 40-176F (4.4-60C) for more than 2 hours. What foods should I eat? Fruits Aim to eat 1-2 cups of fresh, canned (in natural juice), or frozen fruits each day. One cup of fruit equals 1 small apple, 1 large banana, 8 large strawberries, 1 cup (237 g) canned fruit,  cup (82 g) dried fruit, or 1 cup (240 mL) 100% juice. Vegetables Aim to eat 2-4 cups of fresh and frozen vegetables each day, including different varieties and colors. One cup of vegetables equals 1 cup (91 g) broccoli or cauliflower florets, 2 medium carrots, 2 cups (150 g) raw, leafy greens, 1 large tomato, 1 large bell pepper, 1 large sweet potato, or 1 medium white potato. Grains Aim to eat 5-10 ounce-equivalents of whole grains each day. Examples of 1 ounce-equivalent of grains include 1 slice of bread, 1 cup (40 g) ready-to-eat cereal, 3 cups (24 g) popcorn, or  cup (93 g) cooked rice. Meats and other proteins Try to eat 5-7 ounce-equivalents of protein each day. Examples of 1 ounce-equivalent of protein include 1 egg,  oz nuts (12 almonds, 24 pistachios, or 7 walnut halves), 1/4 cup (90 g) cooked beans, 6 tablespoons (90 g) hummus or 1 tablespoon (16 g) peanut butter. A cut of meat or fish that is the size of a deck  of cards is about 3-4 ounce-equivalents (85 g). Of the protein you eat each week, try to have at least 8 sounce (227 g) of seafood. This is about 2 servings per week. This includes salmon, trout, herring, sardines, and anchovies. Dairy Aim to eat 3 cup-equivalents of fat-free or low-fat dairy each day. Examples of 1 cup-equivalent of dairy include 1 cup (240 mL) milk, 8 ounces (250 g) yogurt, 1 ounces (44 g) natural cheese, or 1 cup (240 mL) fortified soy milk. Fats and oils Aim for about 5 teaspoons (21 g) of fats and oils per day. Choose monounsaturated fats, such as canola and olive oils, mayonnaise made with olive oil or avocado oil, avocados, peanut butter, and most nuts, or polyunsaturated fats, such as sunflower, corn, and soybean oils, walnuts, pine nuts, sesame seeds, sunflower seeds, and flaxseed. Beverages Aim for 6 eight-ounce glasses of water per day. Limit coffee to 3-5 eight-ounce cups per day. Limit caffeinated beverages that have added calories, such as soda and energy drinks. If you drink alcohol: Limit how much you have to: 0-1 drink a day if you are female. 0-2 drinks a day if you are female. Know how much alcohol is in your drink. In the [place] one drink is one 12 oz bottle of beer (355 mL), one 5 oz glass of wine (  148 mL), or one 1 oz glass of hard liquor (44 mL). Seasoning and other foods Try not to add too much salt to your food. Try using herbs and spices instead of salt. Try not to add sugar to food. This information is based on [place] nutrition guidelines. To learn more, visit DisposableNylon.be. Exact amounts may vary. You may need different amounts. This information is not intended to replace advice given to you by your health care provider. Make sure you discuss any questions you have with your health care provider. Document Revised: 11/19/2021 Document Reviewed: 11/19/2021 Elsevier Patient Education  2024 [employer].

## 2023-05-26 ENCOUNTER — Ambulatory Visit: Payer: Medicare PPO | Admitting: Nurse Practitioner

## 2023-05-26 ENCOUNTER — Encounter: Payer: Self-pay | Admitting: Nurse Practitioner

## 2023-05-26 VITALS — BP 120/74 | HR 66 | Temp 98.5°F | Ht 61.4 in | Wt 149.6 lb

## 2023-05-26 DIAGNOSIS — F332 Major depressive disorder, recurrent severe without psychotic features: Secondary | ICD-10-CM | POA: Diagnosis not present

## 2023-05-26 DIAGNOSIS — Z0000 Encounter for general adult medical examination without abnormal findings: Secondary | ICD-10-CM | POA: Diagnosis not present

## 2023-05-26 DIAGNOSIS — K219 Gastro-esophageal reflux disease without esophagitis: Secondary | ICD-10-CM

## 2023-05-26 DIAGNOSIS — G43709 Chronic migraine without aura, not intractable, without status migrainosus: Secondary | ICD-10-CM

## 2023-05-26 DIAGNOSIS — I10 Essential (primary) hypertension: Secondary | ICD-10-CM

## 2023-05-26 DIAGNOSIS — G63 Polyneuropathy in diseases classified elsewhere: Secondary | ICD-10-CM | POA: Diagnosis not present

## 2023-05-26 DIAGNOSIS — R7309 Other abnormal glucose: Secondary | ICD-10-CM

## 2023-05-26 DIAGNOSIS — E538 Deficiency of other specified B group vitamins: Secondary | ICD-10-CM

## 2023-05-26 DIAGNOSIS — M810 Age-related osteoporosis without current pathological fracture: Secondary | ICD-10-CM

## 2023-05-26 DIAGNOSIS — E7800 Pure hypercholesterolemia, unspecified: Secondary | ICD-10-CM | POA: Diagnosis not present

## 2023-05-26 DIAGNOSIS — R0989 Other specified symptoms and signs involving the circulatory and respiratory systems: Secondary | ICD-10-CM

## 2023-05-26 DIAGNOSIS — E559 Vitamin D deficiency, unspecified: Secondary | ICD-10-CM

## 2023-05-26 LAB — BAYER DCA HB A1C WAIVED: HB A1C (BAYER DCA - WAIVED): 6.2 % — ABNORMAL HIGH (ref 4.8–5.6)

## 2023-05-26 LAB — MICROALBUMIN, URINE WAIVED
Creatinine, Urine Waived: 200 mg/dL (ref 10–300)
Microalb, Ur Waived: 150 mg/L — ABNORMAL HIGH (ref 0–19)

## 2023-05-26 NOTE — Assessment & Plan Note (Addendum)
 Chronic, ongoing. Recommend she continue supplement due to osteoporosis.  Recheck level today.

## 2023-05-26 NOTE — Assessment & Plan Note (Signed)
 Recent imaging remains stable <50% stenosis.  No current symptoms. Continue statin therapy.

## 2023-05-26 NOTE — Assessment & Plan Note (Signed)
 Stable, minimal at this time.  Continue to monitor.

## 2023-05-26 NOTE — Progress Notes (Signed)
 BP 120/74 (BP Location: Left Arm, Patient Position: Sitting, Cuff Size: Normal)   Pulse 66   Temp 98.5 F (36.9 C) (Oral)   Ht 5' 1.4" (1.56 m)   Wt 149 lb 9.6 oz (67.9 kg)   LMP  (LMP Unknown)   SpO2 97%   BMI 27.90 kg/m    Subjective:    Patient ID: Tabitha Larson, female    DOB: September 09, 1950, 73 y.o.   MRN: [id number]  HPI: Tabitha Larson is a 73 y.o. female presenting on 05/26/2023 for comprehensive medical examination. Current medical complaints include:none  She currently lives with: cousins Menopausal Symptoms: no  HYPERTENSION / HYPERLIPIDEMIA No current blood pressure medications.  Continues Rosuvastatin and Zetia.  Recently saw cardiology on 04/11/23, they started Metoprolol and she is taking Imdur prescribed by them. She is tolerating Metoprolol. Satisfied with current treatment? yes Duration of hypertension: chronic BP monitoring frequency: not checking BP range:  BP medication side effects: no Duration of hyperlipidemia: chronic Cholesterol medication side effects: no Cholesterol supplements: none Medication compliance: good compliance Aspirin: no Recent stressors: no Recurrent headaches: no Visual changes: no Palpitations: no Dyspnea: only if really active Chest pain: no Lower extremity edema: no Dizzy/lightheaded: no  The 10-year ASCVD risk score (Arnett DK, et al., 2019) is: 16.5%   Values used to calculate the score:     Age: 66 years     Sex: Female     Is Non-Hispanic African American: No     Diabetic: No     Tobacco smoker: No     Systolic Blood Pressure: 120 mmHg     Is BP treated: Yes     HDL Cholesterol: 44 mg/dL     Total Cholesterol: 155 mg/dL  Impaired Fasting Glucose HbA1C:  Lab Results  Component Value Date   HGBA1C 6.0 (H) 07/18/2022  Duration of elevated blood sugar: years Polydipsia: no Polyuria: no Weight change: no Visual disturbance: no Glucose Monitoring: no    Accucheck frequency: Not Checking    Fasting glucose:      Post prandial:  Diabetic Education: Not Completed Family history of diabetes: no   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  Carotid u/s 11/18/22 showed stenosis <50%.  Not taking B12 supplement at present.  Gets occasional headaches but not often. Neuropathy status: fluctuates Satisfied with current treatment?: yes Medication side effects: no Location: feet and hands -- not as bad Pain: occasional Severity: 0/10 recently Quality:  tingling and pins and needles Frequency: intermittent Bilateral: yes Symmetric: yes Numbness: yes Decreased sensation: no Weakness: no Context: fluctuating Alleviating factors: nothing Aggravating factors: nothing Treatments attempted: B12 supplement  GERD Taking Protonix.  Gets occasional leg cramps.  Has tried reducing this, but can not. GERD control status: stable  Satisfied with current treatment? yes Heartburn frequency: every day without medicine, but with medication none Medication side effects: no  Medication compliance: stable Previous GERD medications: TUMS Antacid use frequency: every day Dysphagia: no Odynophagia:  no Hematemesis: no Blood in stool: no EGD: October 2020, yes  OSTEOPOROSIS Taking Fosamax weekly.  Has missed some doses due to caring for cousin who is on hospice. Satisfied with current treatment?: yes Medication side effects: no Medication compliance: good compliance Past osteoporosis medications/treatments: none Adequate calcium & vitamin D: yes Intolerance to bisphosphonates:no Weight bearing exercises: yes   DEPRESSION Continues on Duloxetine 60 MG daily. Feels she has more good days then bad days.  Is a stress eater. Mood status: stable Satisfied with  current treatment?: yes Symptom severity: mild  Duration of current treatment : chronic Side effects: no Medication compliance: good compliance Psychotherapy/counseling: none Depressed mood: occasional Anxious mood:  occasional Anhedonia: no Significant weight loss or gain: no Insomnia: no Fatigue: yes Feelings of worthlessness or guilt: occasionally Impaired concentration/indecisiveness: no Suicidal ideations: no Hopelessness: no Crying spells: no    05/26/2023    9:21 AM 11/25/2022   10:43 AM 10/24/2022   10:13 AM 08/29/2022   11:21 AM 07/23/2022    9:06 AM  Depression screen PHQ 2/9  Decreased Interest 1 1 1 1  0  Down, Depressed, Hopeless 1 1 1 1 1   PHQ - 2 Score 2 2 2 2 1   Altered sleeping 0 1 0 2 1  Tired, decreased energy 2 2 1 2 1   Change in appetite 3 3 3 3    Feeling bad or failure about yourself  2 2 1 2  0  Trouble concentrating 1 2 1 1  0  Moving slowly or fidgety/restless 0 1 0 0 0  Suicidal thoughts 0 0 0 0 0  PHQ-9 Score 10 13 8 12 3   Difficult doing work/chores Somewhat difficult Somewhat difficult Somewhat difficult Somewhat difficult Not difficult at all      05/26/2023    9:21 AM 11/25/2022   10:43 AM 10/24/2022   10:13 AM 08/29/2022   11:21 AM  GAD 7 : Generalized Anxiety Score  Nervous, Anxious, on Edge 1 1 1 1   Control/stop worrying 1 2 1 2   Worry too much - different things 1 2 1 2   Trouble relaxing 1 2 1 2   Restless 1 2 1 2   Easily annoyed or irritable 1 2 1  0  Afraid - awful might happen 1 2 1 1   Total GAD 7 Score 7 13 7 10   Anxiety Difficulty Somewhat difficult Somewhat difficult Somewhat difficult Somewhat difficult      07/23/2022    9:09 AM 08/29/2022   11:21 AM 10/24/2022   10:12 AM 11/25/2022   10:43 AM 05/26/2023    9:20 AM  Fall Risk  Falls in the past year? 1 1 1 1  0  Was there an injury with Fall? 0 0 0 0 0  Fall Risk Category Calculator 2 2 1 2  0  Patient at Risk for Falls Due to History of fall(s) History of fall(s) No Fall Risks History of fall(s) No Fall Risks  Fall risk Follow up Falls prevention discussed;Falls evaluation completed Falls evaluation completed Falls evaluation completed Falls evaluation completed Falls evaluation completed      Past Medical History:  Past Medical History:  Diagnosis Date   Ankle fracture    Anxiety    Depression    GERD (gastroesophageal reflux disease)    Hyperlipidemia    Migraine    Osteoporosis    Wears dentures    full upper and lower    Surgical History:  Past Surgical History:  Procedure Laterality Date   ANKLE FRACTURE SURGERY     BROW LIFT Bilateral 07/19/2022   Procedure: BLEPHAROPLASTY UPPER EYELID; W/EXCESS SKIN BLEPHAROPTOSIS REPAIR; RESECT EX BILATERAL;  Surgeon: Imagene Riches, MD;  Location: [facility];  Service: Ophthalmology;  Laterality: Bilateral;   CATARACT EXTRACTION Right 01/2014   CATARACT EXTRACTION W/PHACO Left 03/26/2016   Procedure: CATARACT EXTRACTION PHACO AND INTRAOCULAR LENS PLACEMENT (IOC)  left;  Surgeon: Nevada Crane, MD;  Location: [facility];  Service: Ophthalmology;  Laterality: Left;   COLONOSCOPY  02/04/2014   COLONOSCOPY WITH  PROPOFOL N/A 07/28/2015   Procedure: COLONOSCOPY WITH PROPOFOL;  Surgeon: Midge Minium, MD;  Location: [facility];  Service: Endoscopy;  Laterality: N/A;  No anesthesia   COLONOSCOPY WITH PROPOFOL N/A 05/16/2020   Procedure: COLONOSCOPY WITH PROPOFOL;  Surgeon: Midge Minium, MD;  Location: [facility];  Service: Endoscopy;  Laterality: N/A;   ESOPHAGOGASTRODUODENOSCOPY (EGD) WITH PROPOFOL N/A 12/17/2018   Procedure: ESOPHAGOGASTRODUODENOSCOPY (EGD) WITH Dilation;  Surgeon: Midge Minium, MD;  Location: [facility];  Service: Endoscopy;  Laterality: N/A;  requests mid-morning arrival   MANDIBLE SURGERY     POLYPECTOMY  07/28/2015   Procedure: POLYPECTOMY;  Surgeon: Midge Minium, MD;  Location: [facility];  Service: Endoscopy;;    Medications:  Current Outpatient Medications on File Prior to Visit  Medication Sig   alendronate (FOSAMAX) 70 MG tablet Take 1 tablet (70 mg total) by mouth every 7 (seven) days. Take with a full glass of water on an empty stomach.  Sit upright for one  hour after taking.   clopidogrel (PLAVIX) 75 MG tablet Take 1 tablet (75 mg total) by mouth daily.   DULoxetine (CYMBALTA) 60 MG capsule Take 1 capsule (60 mg total) by mouth daily.   ezetimibe (ZETIA) 10 MG tablet Take 1 tablet (10 mg total) by mouth daily.   metoprolol succinate (TOPROL XL) 25 MG 24 hr tablet Take 1 tablet (25 mg total) by mouth daily.   pantoprazole (PROTONIX) 20 MG tablet Take 1 tablet by mouth once daily   rosuvastatin (CRESTOR) 40 MG tablet Take 1 tablet (40 mg total) by mouth daily.   isosorbide mononitrate (IMDUR) 30 MG 24 hr tablet Take 1 tablet (30 mg total) by mouth daily.   No current facility-administered medications on file prior to visit.    Allergies:  No Known Allergies  Social History:  Social History   Socioeconomic History   Marital status: Divorced    Spouse name: Not on file   Number of children: Not on file   Years of education: Not on file   Highest education level: High school graduate  Occupational History   Occupation: retired  Tobacco Use   Smoking status: Former    Current packs/day: 0.00    Types: Cigarettes    Quit date: 01/05/1984    Years since quitting: 39.4   Smokeless tobacco: Never  Vaping Use   Vaping status: Never Used  Substance and Sexual Activity   Alcohol use: No    Alcohol/week: 0.0 standard drinks of alcohol   Drug use: No   Sexual activity: Not Currently    Birth control/protection: Post-menopausal  Other Topics Concern   Not on file  Social History Narrative   Niece lives in area   No kids   Has friends close by   [place] live with her currently    Social Drivers of Health   Financial Resource Strain: Low Risk  (07/23/2022)   Overall Financial Resource Strain (CARDIA)    Difficulty of Paying Living Expenses: Not very hard  Food Insecurity: No Food Insecurity (07/23/2022)   Hunger Vital Sign    Worried About Running Out of Food in the Last Year: Never true    Ran Out of Food in the Last Year: Never  true  Transportation Needs: No Transportation Needs (07/23/2022)   PRAPARE - Administrator, Civil Service (Medical): No    Lack of Transportation (Non-Medical): No  Physical Activity: Insufficiently Active (07/23/2022)   Exercise Vital Sign    Days  of Exercise per Week: 3 days    Minutes of Exercise per Session: 30 min  Stress: No Stress Concern Present (07/23/2022)   [employer] of Occupational Health - Occupational Stress Questionnaire    Feeling of Stress : Only a little  Social Connections: Unknown (07/23/2022)   Social Connection and Isolation Panel [NHANES]    Frequency of Communication with Friends and Family: Twice a week    Frequency of Social Gatherings with Friends and Family: Not on file    Attends Religious Services: More than 4 times per year    Active Member of [employer] or Organizations: No    Attends Banker Meetings: Never    Marital Status: Divorced  Catering manager Violence: Not At Risk (07/23/2022)   Humiliation, Afraid, Rape, and Kick questionnaire    Fear of Current or Ex-Partner: No    Emotionally Abused: No    Physically Abused: No    Sexually Abused: No   Social History   Tobacco Use  Smoking Status Former   Current packs/day: 0.00   Types: Cigarettes   Quit date: 01/05/1984   Years since quitting: 39.4  Smokeless Tobacco Never   Social History   Substance and Sexual Activity  Alcohol Use No   Alcohol/week: 0.0 standard drinks of alcohol    Family History:  Family History  Problem Relation Age of Onset   Stroke Mother    Prostate cancer Father    Colon cancer Sister    Hypertension Sister    Hyperlipidemia Sister    Breast cancer Neg Hx     Past medical history, surgical history, medications, allergies, family history and social history reviewed with patient today and changes made to appropriate areas of the chart.   ROS All other ROS negative except what is listed above and in the HPI.      Objective:     BP 120/74 (BP Location: Left Arm, Patient Position: Sitting, Cuff Size: Normal)   Pulse 66   Temp 98.5 F (36.9 C) (Oral)   Ht 5' 1.4" (1.56 m)   Wt 149 lb 9.6 oz (67.9 kg)   LMP  (LMP Unknown)   SpO2 97%   BMI 27.90 kg/m   Wt Readings from Last 3 Encounters:  05/26/23 149 lb 9.6 oz (67.9 kg)  04/11/23 145 lb (65.8 kg)  01/09/23 141 lb 12.8 oz (64.3 kg)    Physical Exam Vitals and nursing note reviewed. Exam conducted with a chaperone present.  Constitutional:      General: She is awake. She is not in acute distress.    Appearance: She is well-developed and well-groomed. She is not ill-appearing or toxic-appearing.  HENT:     Head: Normocephalic and atraumatic.     Right Ear: Hearing, tympanic membrane, ear canal and external ear normal. No drainage.     Left Ear: Hearing, tympanic membrane, ear canal and external ear normal. No drainage.     Nose: Nose normal.     Right Sinus: No maxillary sinus tenderness or frontal sinus tenderness.     Left Sinus: No maxillary sinus tenderness or frontal sinus tenderness.     Mouth/Throat:     Mouth: Mucous membranes are moist.     Pharynx: Oropharynx is clear. Uvula midline. No pharyngeal swelling, oropharyngeal exudate or posterior oropharyngeal erythema.  Eyes:     General: Lids are normal.        Right eye: No discharge.        Left eye: No  discharge.     Extraocular Movements: Extraocular movements intact.     Conjunctiva/sclera: Conjunctivae normal.     Pupils: Pupils are equal, round, and reactive to light.     Visual Fields: Right eye visual fields normal and left eye visual fields normal.  Neck:     Thyroid: No thyromegaly.     Vascular: Carotid bruit (right side) present.     Trachea: Trachea normal.  Cardiovascular:     Rate and Rhythm: Normal rate and regular rhythm.     Heart sounds: Normal heart sounds. No murmur heard.    No gallop.  Pulmonary:     Effort: Pulmonary effort is normal. No accessory muscle usage or  respiratory distress.     Breath sounds: Normal breath sounds.  Chest:  Breasts:    Right: Normal.     Left: Normal.  Abdominal:     General: Bowel sounds are normal.     Palpations: Abdomen is soft. There is no hepatomegaly or splenomegaly.     Tenderness: There is no abdominal tenderness.  Musculoskeletal:        General: Normal range of motion.     Cervical back: Normal range of motion and neck supple.     Right lower leg: No edema.     Left lower leg: No edema.  Lymphadenopathy:     Head:     Right side of head: No submental, submandibular, tonsillar, preauricular or posterior auricular adenopathy.     Left side of head: No submental, submandibular, tonsillar, preauricular or posterior auricular adenopathy.     Cervical: No cervical adenopathy.     Upper Body:     Right upper body: No supraclavicular, axillary or pectoral adenopathy.     Left upper body: No supraclavicular, axillary or pectoral adenopathy.  Skin:    General: Skin is warm and dry.     Capillary Refill: Capillary refill takes less than 2 seconds.     Findings: No rash.  Neurological:     Mental Status: She is alert and oriented to person, place, and time.     Gait: Gait is intact.     Deep Tendon Reflexes: Reflexes are normal and symmetric.     Reflex Scores:      Brachioradialis reflexes are 2+ on the right side and 2+ on the left side.      Patellar reflexes are 2+ on the right side and 2+ on the left side. Psychiatric:        Attention and Perception: Attention normal.        Mood and Affect: Mood normal.        Speech: Speech normal.        Behavior: Behavior normal. Behavior is cooperative.        Thought Content: Thought content normal.        Judgment: Judgment normal.     Results for orders placed or performed during the hospital encounter of 11/08/22  ECHOCARDIOGRAM COMPLETE   Collection Time: 11/08/22 10:39 AM  Result Value Ref Range   Ao pk vel 1.10 m/s   AV Area VTI 2.54 cm2   AR max  vel 2.20 cm2   AV Mean grad 3.0 mmHg   AV Peak grad 4.8 mmHg   S' Lateral 2.90 cm   AV Area mean vel 2.18 cm2   Area-P 1/2 3.43 cm2   MV VTI 1.54 cm2   Est EF 60 - 65%       Assessment & Plan:  Problem List Items Addressed This Visit       Cardiovascular and Mediastinum   Benign hypertension   Chronic, stable.  BP at goal today.  Continue current diet control. Discussed at length with patient.  Recommend she monitor BP at least a few mornings a week at home and document for provider.  DASH diet at home.  Labs today: CMP, CBC, urine ALB.  Urine ALB 150 March 2025, may benefit ACE or ARB in future.       Relevant Orders   CBC with Differential/Platelet   Comprehensive metabolic panel   TSH   Headache, migraine   Stable, minimal at this time.  Continue to monitor.        Digestive   GERD (gastroesophageal reflux disease)   Chronic, ongoing.  Tried stopping Protonix with return of symptoms in past.  Recommend she continue current regimen and adjust as needed.  Mag level annually. Risks of PPI use were discussed with patient including bone loss, C. Diff diarrhea, pneumonia, infections, CKD, electrolyte abnormalities.  Verbalizes understanding and chooses to continue the medication.       Relevant Orders   Magnesium     Nervous and Auditory   B12 neuropathy (HCC) - Primary   Ongoing. Has had MRI, carotid u/s, and multiple labs to rule our tick borne disease and other disease processes, all have been reassuring.  ?fibromyalgia vs other neurologic condition presenting.  Continue Duloxetine and B12 supplement at this time + collaboration with neurology.  Labs today.      Relevant Orders   CBC with Differential/Platelet   Vitamin B12     Musculoskeletal and Integument   Osteoporosis of lumbar spine   Chronic.  Educated patient on findings and progression.  She is tolerating Fosamax, will continue this for now and monitor closely for ADR.  Educated her on this and side effects  to notify provider of.  Continue Vit D and Calcium at home + regular exercise.  Repeat DEXA around 08/29/24.      Relevant Orders   VITAMIN D 25 Hydroxy (Vit-D Deficiency, Fractures)     Other   Elevated hemoglobin A1c   Ongoing with A1c trend up from 6% to 6.2% today.  Recommend heavy focus on reduction of sweets and focus on regular exercise routine.  Start medication in future as needed.      Relevant Orders   Bayer DCA Hb A1c Waived   Microalbumin, Urine Waived   Hypercholesterolemia   Chronic, ongoing.  Continue current medication regimen and adjust as needed. Lipid panel and CMP today.         Relevant Orders   Comprehensive metabolic panel   Lipid Panel w/o Chol/HDL Ratio   Major depression   Chronic, ongoing.  Denies SI/HI.  Will continue Cymbalta as is tolerating and mood stable -- ?fibromyalgia vs neuropathy.  Noticing improvement in discomfort with Cymbalta on board.      Right carotid bruit   Recent imaging remains stable <50% stenosis.  No current symptoms. Continue statin therapy.      Vitamin D deficiency   Chronic, ongoing. Recommend she continue supplement due to osteoporosis.  Recheck level today.       Other Visit Diagnoses       Encounter for annual physical exam       Annual physical today with labs and health maintenance reviewed, discussed with patient.        Follow up plan: Return in about 6 months (around 11/26/2023) for  HTN/HLD, MOOD, OSTEOPOROSIS, IFG.   LABORATORY TESTING:  - Pap smear: not applicable  IMMUNIZATIONS:   - Tdap: Tetanus vaccination status reviewed: last tetanus booster within 10 years. - Influenza: Up to date - Pneumovax: Up to date - Prevnar: Up to date - COVID: Up to date - HPV: Not applicable - Shingrix vaccine:  will get at pharmacy  SCREENING: -Mammogram: Up to date  - Colonoscopy: Up to date  - Bone Density: Up to date  -Hearing Test: Not applicable  -Spirometry: Not applicable   PATIENT COUNSELING:    Advised to take 1 mg of folate supplement per day if capable of pregnancy.   Sexuality: Discussed sexually transmitted diseases, partner selection, use of condoms, avoidance of unintended pregnancy  and contraceptive alternatives.   Advised to avoid cigarette smoking.  I discussed with the patient that most people either abstain from alcohol or drink within safe limits (<=14/week and <=4 drinks/occasion for males, <=7/weeks and <= 3 drinks/occasion for females) and that the risk for alcohol disorders and other health effects rises proportionally with the number of drinks per week and how often a drinker exceeds daily limits.  Discussed cessation/primary prevention of drug use and availability of treatment for abuse.   Diet: Encouraged to adjust caloric intake to maintain  or achieve ideal body weight, to reduce intake of dietary saturated fat and total fat, to limit sodium intake by avoiding high sodium foods and not adding table salt, and to maintain adequate dietary potassium and calcium preferably from fresh fruits, vegetables, and low-fat dairy products.    Stressed the importance of regular exercise  Injury prevention: Discussed safety belts, safety helmets, smoke detector, smoking near bedding or upholstery.   Dental health: Discussed importance of regular tooth brushing, flossing, and dental visits.    NEXT PREVENTATIVE PHYSICAL DUE IN 1 YEAR. Return in about 6 months (around 11/26/2023) for HTN/HLD, MOOD, OSTEOPOROSIS, IFG.

## 2023-05-26 NOTE — Assessment & Plan Note (Signed)
 Ongoing. Has had MRI, carotid u/s, and multiple labs to rule our tick borne disease and other disease processes, all have been reassuring.  ?fibromyalgia vs other neurologic condition presenting.  Continue Duloxetine and B12 supplement at this time + collaboration with neurology.  Labs today.

## 2023-05-26 NOTE — Assessment & Plan Note (Signed)
Chronic, ongoing.  Tried stopping Protonix with return of symptoms in past.  Recommend she continue current regimen and adjust as needed.  Mag level annually. Risks of PPI use were discussed with patient including bone loss, C. Diff diarrhea, pneumonia, infections, CKD, electrolyte abnormalities.  Verbalizes understanding and chooses to continue the medication.

## 2023-05-26 NOTE — Assessment & Plan Note (Signed)
 Ongoing with A1c trend up from 6% to 6.2% today.  Recommend heavy focus on reduction of sweets and focus on regular exercise routine.  Start medication in future as needed.

## 2023-05-26 NOTE — Assessment & Plan Note (Signed)
 Chronic, stable.  BP at goal today.  Continue current diet control. Discussed at length with patient.  Recommend she monitor BP at least a few mornings a week at home and document for provider.  DASH diet at home.  Labs today: CMP, CBC, urine ALB.  Urine ALB 150 March 2025, may benefit ACE or ARB in future.

## 2023-05-26 NOTE — Assessment & Plan Note (Signed)
Chronic, ongoing.  Denies SI/HI.  Will continue Cymbalta as is tolerating and mood stable -- ?fibromyalgia vs neuropathy.  Noticing improvement in discomfort with Cymbalta on board.

## 2023-05-26 NOTE — Assessment & Plan Note (Signed)
 Chronic.  Educated patient on findings and progression.  She is tolerating Fosamax, will continue this for now and monitor closely for ADR.  Educated her on this and side effects to notify provider of.  Continue Vit D and Calcium at home + regular exercise.  Repeat DEXA around 08/29/24.

## 2023-05-26 NOTE — Assessment & Plan Note (Signed)
 Chronic, ongoing.  Continue current medication regimen and adjust as needed.  Lipid panel and CMP today.

## 2023-05-27 LAB — MAGNESIUM: Magnesium: 2 mg/dL (ref 1.6–2.3)

## 2023-05-27 LAB — COMPREHENSIVE METABOLIC PANEL
ALT: 15 IU/L (ref 0–32)
AST: 18 IU/L (ref 0–40)
Albumin: 4.1 g/dL (ref 3.8–4.8)
Alkaline Phosphatase: 59 IU/L (ref 44–121)
BUN/Creatinine Ratio: 17 (ref 12–28)
BUN: 12 mg/dL (ref 8–27)
Bilirubin Total: 0.4 mg/dL (ref 0.0–1.2)
CO2: 22 mmol/L (ref 20–29)
Calcium: 9 mg/dL (ref 8.7–10.3)
Chloride: 104 mmol/L (ref 96–106)
Creatinine, Ser: 0.7 mg/dL (ref 0.57–1.00)
Globulin, Total: 2.3 g/dL (ref 1.5–4.5)
Glucose: 111 mg/dL — ABNORMAL HIGH (ref 70–99)
Potassium: 4.1 mmol/L (ref 3.5–5.2)
Sodium: 141 mmol/L (ref 134–144)
Total Protein: 6.4 g/dL (ref 6.0–8.5)
eGFR: 91 mL/min/{1.73_m2} (ref >59)

## 2023-05-27 LAB — CBC WITH DIFFERENTIAL/PLATELET
Basophils Absolute: 0 10*3/uL (ref 0.0–0.2)
Basos: 1 %
EOS (ABSOLUTE): 0.1 10*3/uL (ref 0.0–0.4)
Eos: 1 %
Hematocrit: 41.4 % (ref 34.0–46.6)
Hemoglobin: 13.1 g/dL (ref 11.1–15.9)
Immature Grans (Abs): 0 10*3/uL (ref 0.0–0.1)
Immature Granulocytes: 0 %
Lymphocytes Absolute: 1.4 10*3/uL (ref 0.7–3.1)
Lymphs: 23 %
MCH: 28.3 pg (ref 26.6–33.0)
MCHC: 31.6 g/dL (ref 31.5–35.7)
MCV: 89 fL (ref 79–97)
Monocytes Absolute: 0.4 10*3/uL (ref 0.1–0.9)
Monocytes: 7 %
Neutrophils Absolute: 4 10*3/uL (ref 1.4–7.0)
Neutrophils: 68 %
Platelets: 175 10*3/uL (ref 150–450)
RBC: 4.63 x10E6/uL (ref 3.77–5.28)
RDW: 14.1 % (ref 11.7–15.4)
WBC: 5.8 10*3/uL (ref 3.4–10.8)

## 2023-05-27 LAB — VITAMIN D 25 HYDROXY (VIT D DEFICIENCY, FRACTURES): Vit D, 25-Hydroxy: 29.2 ng/mL — ABNORMAL LOW (ref 30.0–100.0)

## 2023-05-27 LAB — LIPID PANEL W/O CHOL/HDL RATIO
Cholesterol, Total: 137 mg/dL (ref 100–199)
HDL: 46 mg/dL (ref >39)
LDL Chol Calc (NIH): 69 mg/dL (ref 0–99)
Triglycerides: 121 mg/dL (ref 0–149)
VLDL Cholesterol Cal: 22 mg/dL (ref 5–40)

## 2023-05-27 LAB — TSH: TSH: 4.29 u[IU]/mL (ref 0.450–4.500)

## 2023-05-27 LAB — VITAMIN B12: Vitamin B-12: 359 pg/mL (ref 232–1245)

## 2023-05-27 NOTE — Progress Notes (Signed)
 Good morning, please let Caliegh know labs have returned and overall these remain stable.  Vitamin D is a little low and B12, please ensure you are taking Vitamin D 2000 units daily and B12 1000 MCG daily for bone and nervous system health.  Remainder of labs look great.  Continue all current medications.  Any questions? Keep being stellar!!  Thank you for allowing me to participate in your care.  I appreciate you. Kindest regards, Gerard Bonus

## 2023-06-02 IMAGING — MG DIGITAL DIAGNOSTIC BILAT W/ TOMO W/ CAD
6 of 10 series · 6 of 30 positions shown · non-contrast
Comparison: Previous exam(s).

CLINICAL DATA: Patient presents with a focal area of lateral left
breast pain. No reported lumps.

EXAM:
DIGITAL DIAGNOSTIC BILATERAL MAMMOGRAM WITH TOMOSYNTHESIS AND CAD;
ULTRASOUND LEFT BREAST LIMITED
TECHNIQUE: Bilateral digital diagnostic mammography and breast tomosynthesis
was performed. The images were evaluated with computer-aided
detection.; Targeted ultrasound examination of the left breast was
performed

[L CC synth-2D (1 of 2)]
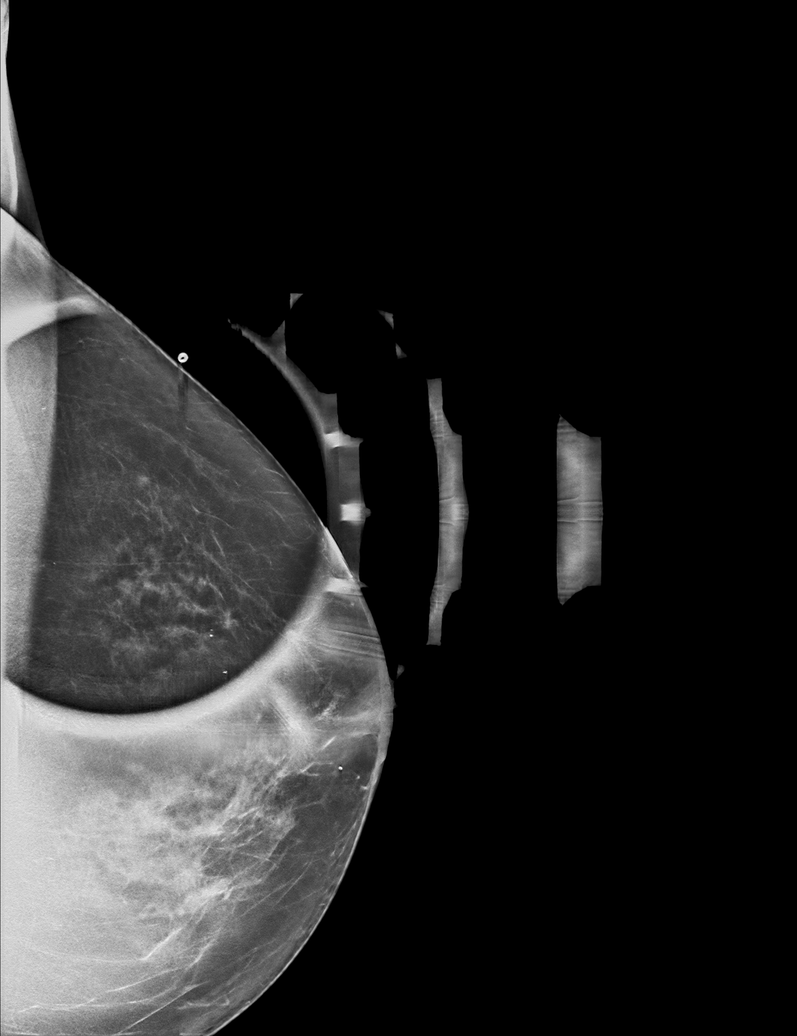

[L CC synth-2D (2 of 2)]
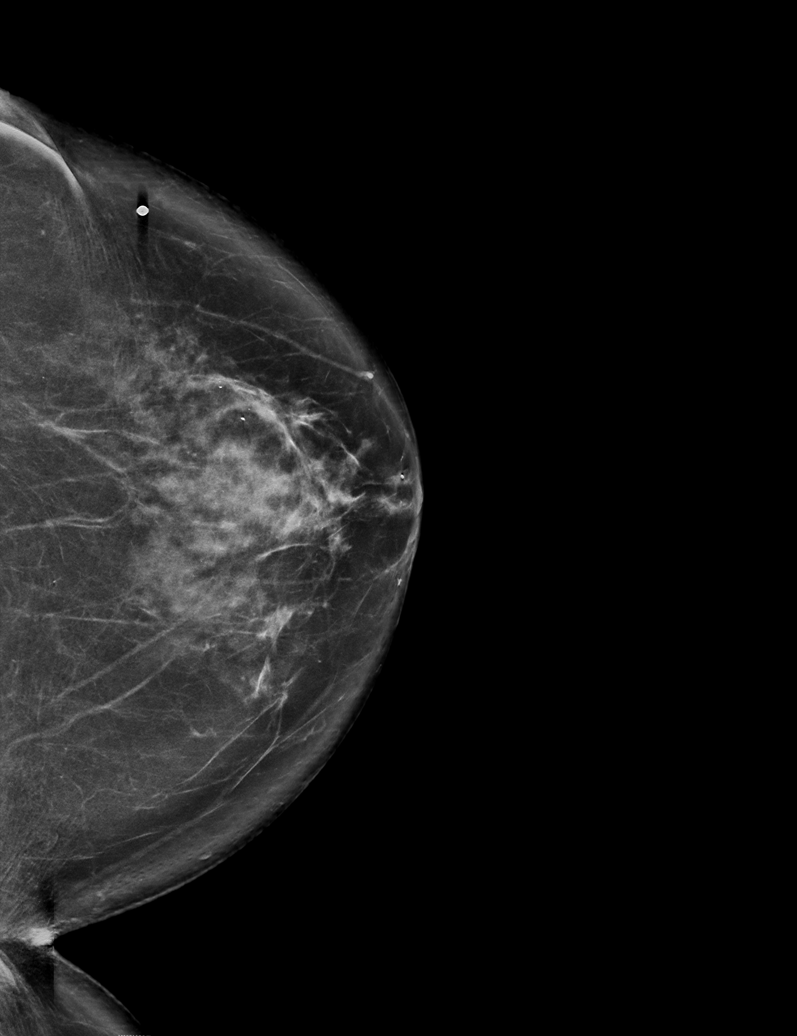

[R CC synth-2D]
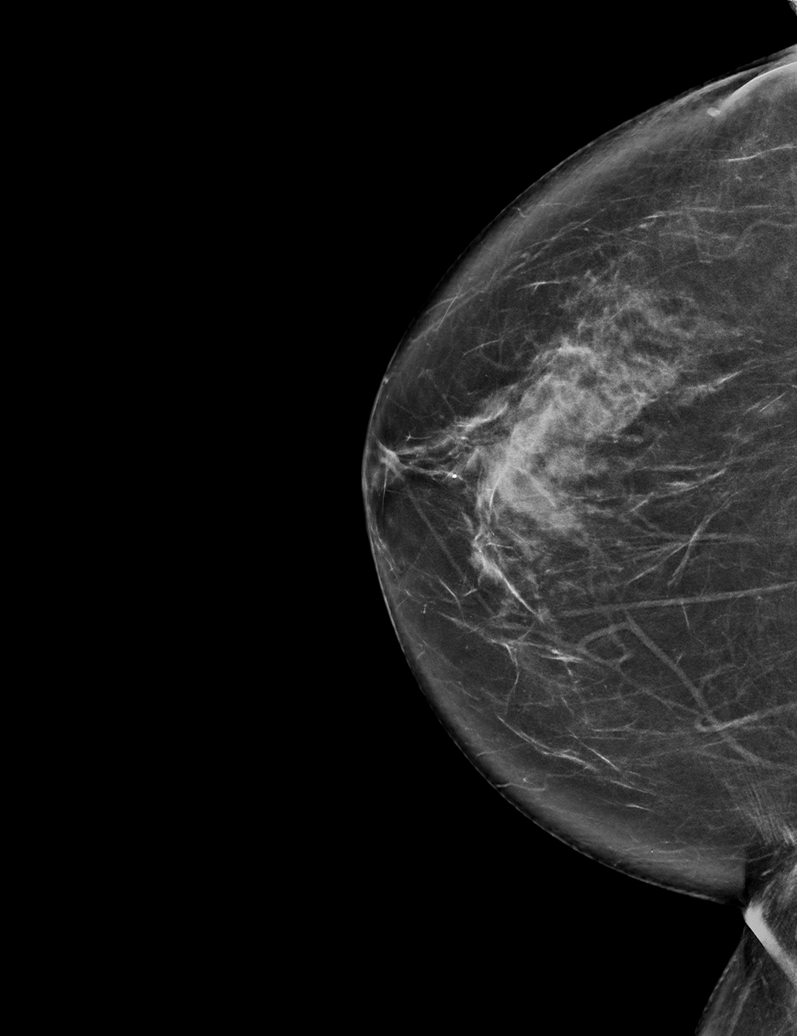

[L MLO synth-2D]
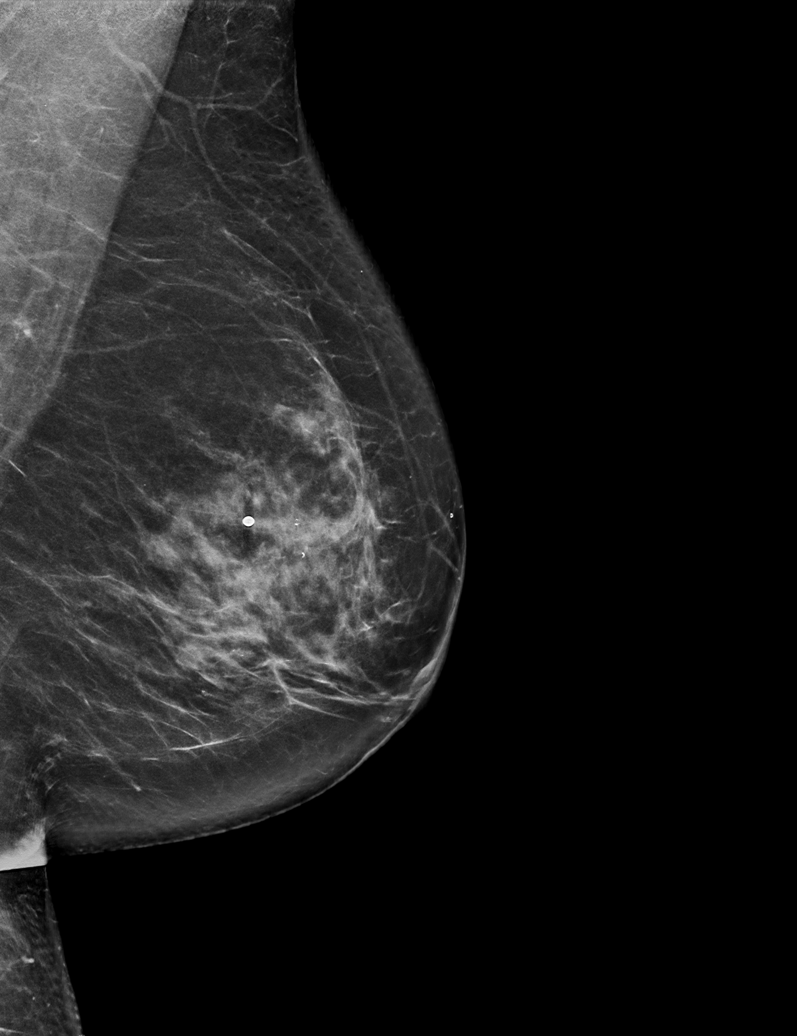

[R MLO synth-2D]
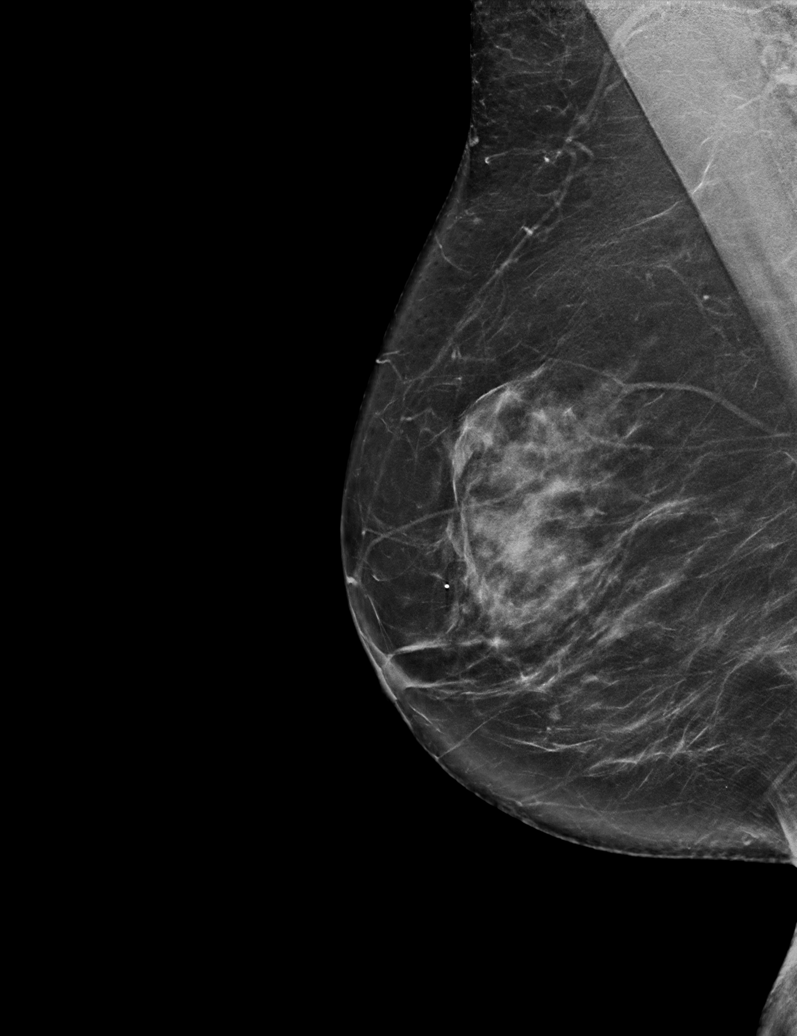

[L MLO tomo · tomo slice 36/71.0]
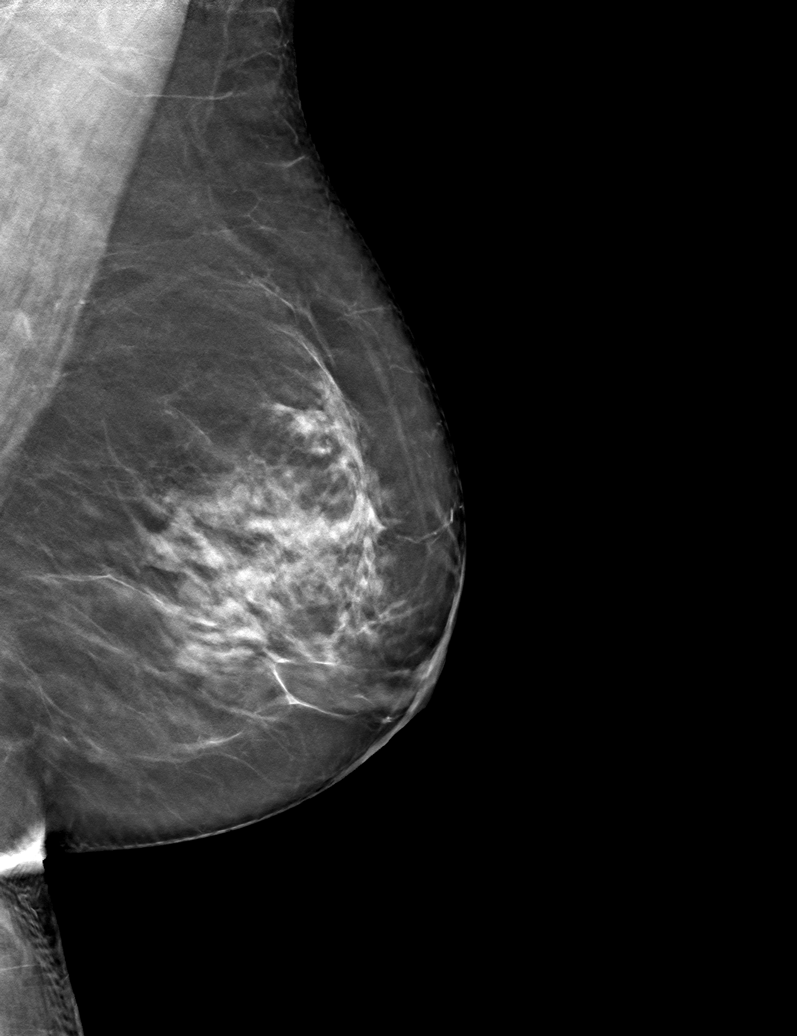

[6 of 30 positions shown; findings below may reference images not displayed]

ACR Breast Density Category c: The breast tissue is heterogeneously
dense, which may obscure small masses.
FINDINGS: In the left breast, there is a partly circumscribed and partly
obscured mass in the 6 o'clock retroareolar breast, middle to
posterior depth.

There are no other masses, no areas of architectural distortion and
no suspicious calcifications.

On physical exam, patient is tender to palpation along the lateral
left breast. No palpable masses.

Targeted left breast ultrasound is performed, showing a cyst in the
posterior left breast at 2 o'clock, 1 cm from the nipple, oval in
shape, measuring 7 x 4 x 5 mm, consistent with the mass at projected
closer to 6 o'clock on the mammographies. There are no other left
breast masses. No areas of abnormal shadowing.
IMPRESSION: 1. No evidence of breast malignancy.
2. Small benign left breast cyst.

RECOMMENDATION:
Screening mammogram in one year.(Code:91-U-35M)

I have discussed the findings and recommendations with the patient.
If applicable, a reminder letter will be sent to the patient
regarding the next appointment.

BI-RADS CATEGORY  2: Benign.

## 2023-06-02 IMAGING — US US BREAST*L* LIMITED INC AXILLA
1 series · 4 of 4 positions shown · non-contrast
Comparison: Previous exam(s).

CLINICAL DATA: Patient presents with a focal area of lateral left
breast pain. No reported lumps.

EXAM:
DIGITAL DIAGNOSTIC BILATERAL MAMMOGRAM WITH TOMOSYNTHESIS AND CAD;
ULTRASOUND LEFT BREAST LIMITED
TECHNIQUE: Bilateral digital diagnostic mammography and breast tomosynthesis
was performed. The images were evaluated with computer-aided
detection.; Targeted ultrasound examination of the left breast was
performed

[Series 1: us breast*left* limited inc axilla · 0.06mm/px · 4 of 4 slices shown]
[im 1/4]
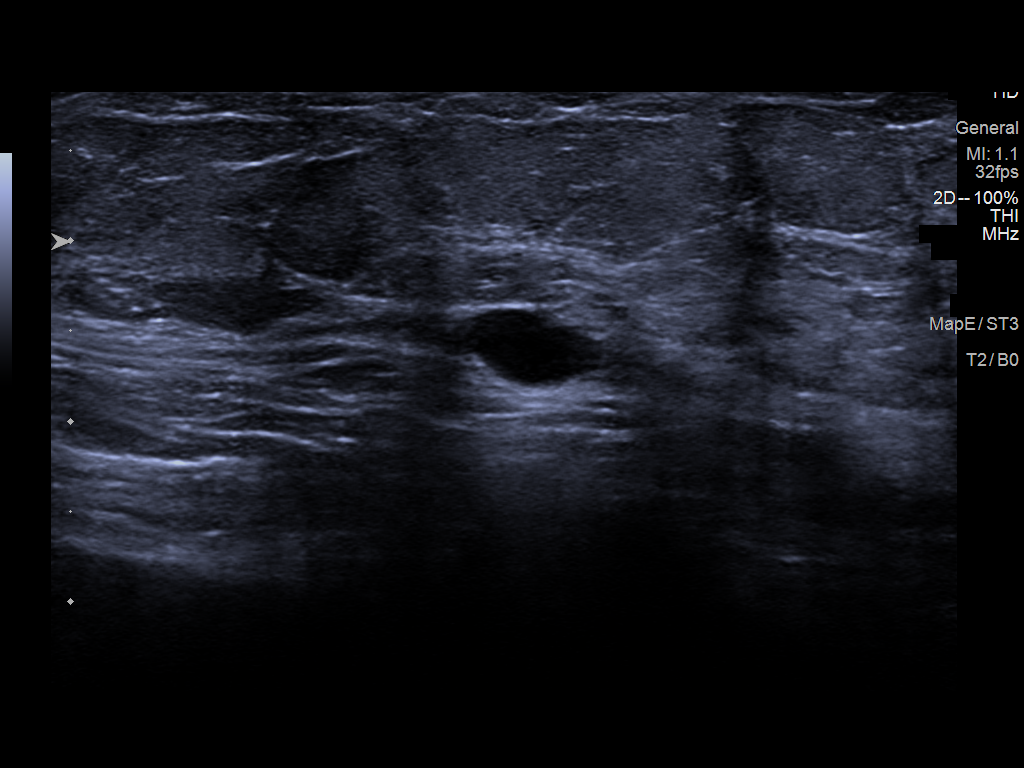
[im 2/4]
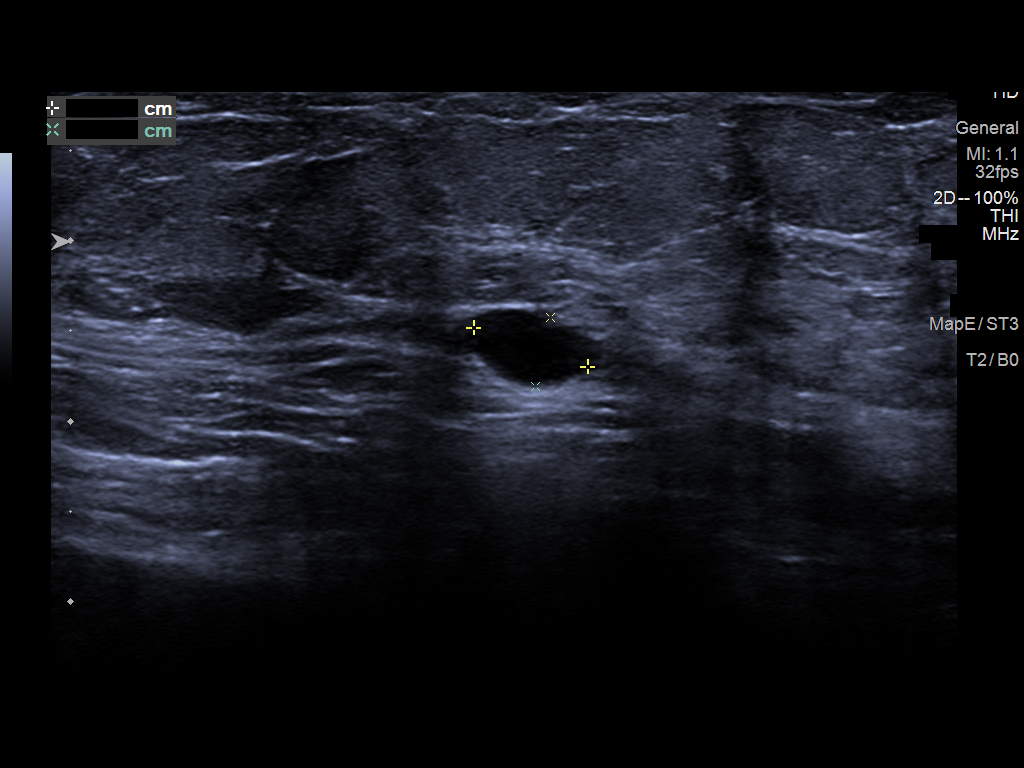
[im 3/4]
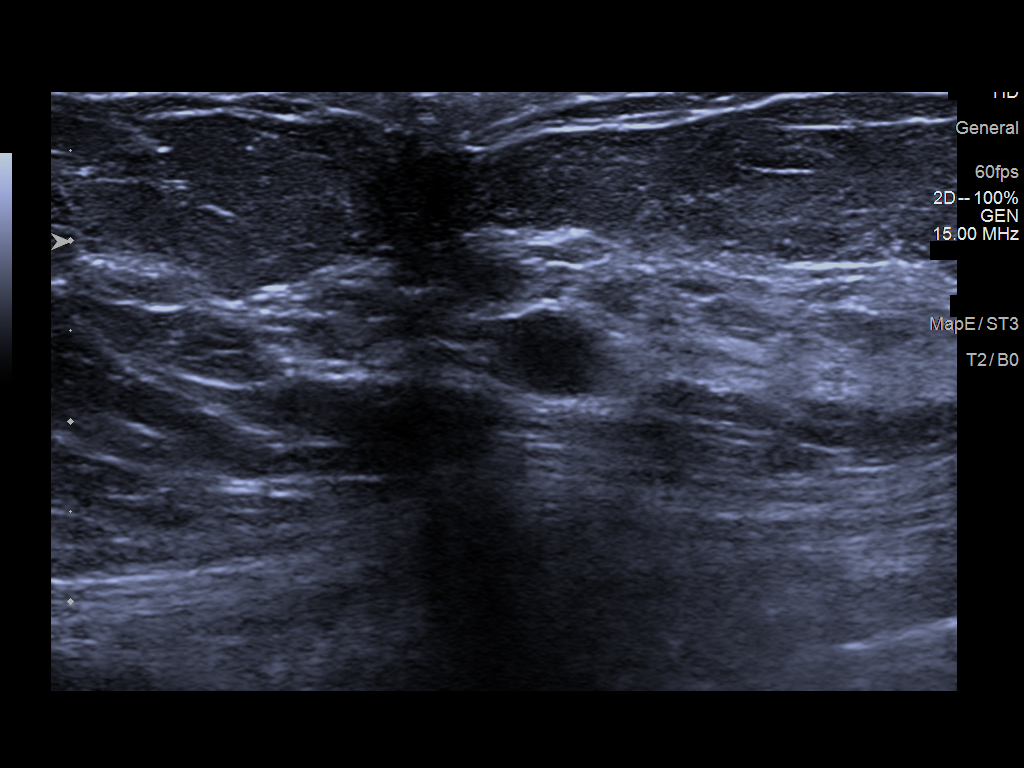
[im 4/4]
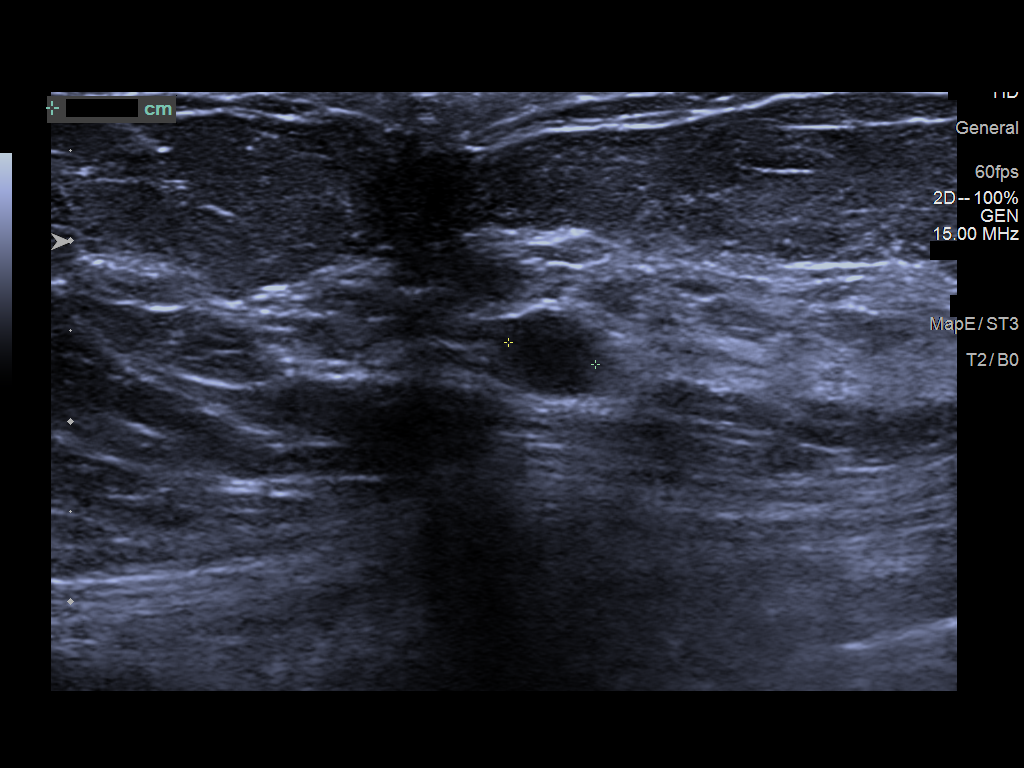

[4 of 4 positions shown; findings below may reference images not displayed]

ACR Breast Density Category c: The breast tissue is heterogeneously
dense, which may obscure small masses.
FINDINGS: In the left breast, there is a partly circumscribed and partly
obscured mass in the 6 o'clock retroareolar breast, middle to
posterior depth.

There are no other masses, no areas of architectural distortion and
no suspicious calcifications.

On physical exam, patient is tender to palpation along the lateral
left breast. No palpable masses.

Targeted left breast ultrasound is performed, showing a cyst in the
posterior left breast at 2 o'clock, 1 cm from the nipple, oval in
shape, measuring 7 x 4 x 5 mm, consistent with the mass at projected
closer to 6 o'clock on the mammographies. There are no other left
breast masses. No areas of abnormal shadowing.
IMPRESSION: 1. No evidence of breast malignancy.
2. Small benign left breast cyst.

RECOMMENDATION:
Screening mammogram in one year.(Code:91-U-35M)

I have discussed the findings and recommendations with the patient.
If applicable, a reminder letter will be sent to the patient
regarding the next appointment.

BI-RADS CATEGORY  2: Benign.

## 2023-07-09 ENCOUNTER — Ambulatory Visit: Payer: Medicare PPO | Admitting: Cardiology

## 2023-07-18 ENCOUNTER — Other Ambulatory Visit: Payer: Self-pay | Admitting: Nurse Practitioner

## 2023-07-21 NOTE — Telephone Encounter (Signed)
 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

## 2023-07-25 ENCOUNTER — Telehealth: Payer: Self-pay | Admitting: Nurse Practitioner

## 2023-07-25 NOTE — Telephone Encounter (Signed)
 Copied from CRM [id number]. Topic: Medicare AWV >> Jul 25, 2023  3:14 PM Juliana Ocean wrote: Reason for CRM: LVM 07/25/23 to r/s awv appt 07/29/23 at12:30p due to [facility] lunch break. Please call back to r/s AWV.  Tabitha Larson; Care Guide Ambulatory Clinical Support [place] [facility] Direct Dial: [phone]

## 2023-07-29 ENCOUNTER — Ambulatory Visit: Payer: Self-pay

## 2023-08-06 ENCOUNTER — Encounter: Payer: Self-pay | Admitting: Cardiology

## 2023-08-06 ENCOUNTER — Ambulatory Visit: Attending: Cardiology | Admitting: Cardiology

## 2023-08-06 VITALS — BP 118/80 | HR 67 | Ht 62.0 in | Wt 150.0 lb

## 2023-08-06 DIAGNOSIS — E7800 Pure hypercholesterolemia, unspecified: Secondary | ICD-10-CM | POA: Diagnosis not present

## 2023-08-06 DIAGNOSIS — I2510 Atherosclerotic heart disease of native coronary artery without angina pectoris: Secondary | ICD-10-CM

## 2023-08-06 MED ORDER — ISOSORBIDE MONONITRATE ER 60 MG PO TB24: 60.0000 mg | ORAL_TABLET | Freq: Every day | ORAL | 3 refills | Status: AC

## 2023-08-06 NOTE — Progress Notes (Signed)
 Cardiology Office Note:    Date:  08/06/2023   ID:  Tabitha Larson, DOB 1951/02/23, MRN [id number]  PCP:  Lemar Pyles, NP   [facility] Providers Cardiologist:  Constancia Delton, MD     Referring MD: Lemar Pyles, NP   Chief Complaint  Patient presents with   Follow-up    3 month follow up visit. Patient states that she experiences cold sweats that come on for no reason. Meds reviewed.     History of Present Illness:    Tabitha Larson is a 72 y.o. female with a hx of nonobstructive CAD (moderate proximal LAD, mild RCA CCTA 9/24 ), hyperlipidemia, GERD presenting with for follow-up.  Previously seen due to chest pain, Toprol -XL was started with good effect.  Denies chest pain but endorses cold sweats since starting metoprolol .  Symptoms of alcohol almost daily.  Compliant with meds as prescribed.  Otherwise doing okay.   Prior notes/testing Coronary CT 11/2022 showed moderate LAD, mild RCA stenosis, FFR CT nonsignificant. Echo 11/2022 showed normal EF 60 to 65%.   Past Medical History:  Diagnosis Date   Ankle fracture    Anxiety    Depression    GERD (gastroesophageal reflux disease)    Hyperlipidemia    Migraine    Osteoporosis    Wears dentures    full upper and lower    Past Surgical History:  Procedure Laterality Date   ANKLE FRACTURE SURGERY     BROW LIFT Bilateral 07/19/2022   Procedure: BLEPHAROPLASTY UPPER EYELID; W/EXCESS SKIN BLEPHAROPTOSIS REPAIR; RESECT EX BILATERAL;  Surgeon: Zacarias Hermann, MD;  Location: [facility];  Service: Ophthalmology;  Laterality: Bilateral;   CATARACT EXTRACTION Right 01/2014   CATARACT EXTRACTION W/PHACO Left 03/26/2016   Procedure: CATARACT EXTRACTION PHACO AND INTRAOCULAR LENS PLACEMENT (IOC)  left;  Surgeon: Rosa College, MD;  Location: [facility];  Service: Ophthalmology;  Laterality: Left;   COLONOSCOPY  02/04/2014   COLONOSCOPY WITH PROPOFOL  N/A 07/28/2015   Procedure:  COLONOSCOPY WITH PROPOFOL ;  Surgeon: Marnee Sink, MD;  Location: [facility];  Service: Endoscopy;  Laterality: N/A;  No anesthesia   COLONOSCOPY WITH PROPOFOL  N/A 05/16/2020   Procedure: COLONOSCOPY WITH PROPOFOL ;  Surgeon: Marnee Sink, MD;  Location: [facility] END[facility];  Service: Endoscopy;  Laterality: N/A;   ESOPHAGOGASTRODUODENOSCOPY (EGD) WITH PROPOFOL  N/A 12/17/2018   Procedure: ESOPHAGOGASTRODUODENOSCOPY (EGD) WITH Dilation;  Surgeon: Marnee Sink, MD;  Location: [facility];  Service: Endoscopy;  Laterality: N/A;  requests mid-morning arrival   MANDIBLE SURGERY     POLYPECTOMY  07/28/2015   Procedure: POLYPECTOMY;  Surgeon: Marnee Sink, MD;  Location: [facility];  Service: Endoscopy;;    Current Medications: Current Meds  Medication Sig   alendronate  (FOSAMAX ) 70 MG tablet Take 1 tablet (70 mg total) by mouth every 7 (seven) days. Take with a full glass of water  on an empty stomach.  Sit upright for one hour after taking.   clopidogrel  (PLAVIX ) 75 MG tablet Take 1 tablet (75 mg total) by mouth daily.   DULoxetine  (CYMBALTA ) 60 MG capsule Take 1 capsule (60 mg total) by mouth daily.   ezetimibe  (ZETIA ) 10 MG tablet Take 1 tablet (10 mg total) by mouth daily.   pantoprazole  (PROTONIX ) 20 MG tablet Take 1 tablet by mouth once daily   rosuvastatin  (CRESTOR ) 40 MG tablet Take 1 tablet (40 mg total) by mouth daily.   [DISCONTINUED] isosorbide  mononitrate (IMDUR ) 30 MG 24 hr tablet Take 1  tablet (30 mg total) by mouth daily.   [DISCONTINUED] metoprolol  succinate (TOPROL  XL) 25 MG 24 hr tablet Take 1 tablet (25 mg total) by mouth daily.     Allergies:   Patient has no known allergies.   Social History   Socioeconomic History   Marital status: Divorced    Spouse name: Not on file   Number of children: Not on file   Years of education: Not on file   Highest education level: High school graduate  Occupational History   Occupation: retired  Tobacco Use   Smoking  status: Former    Current packs/day: 0.00    Types: Cigarettes    Quit date: 01/05/1984    Years since quitting: 39.6   Smokeless tobacco: Never  Vaping Use   Vaping status: Never Used  Substance and Sexual Activity   Alcohol use: No    Alcohol/week: 0.0 standard drinks of alcohol   Drug use: No   Sexual activity: Not Currently    Birth control/protection: Post-menopausal  Other Topics Concern   Not on file  Social History Narrative   Niece lives in area   No kids   Has friends close by   [place] live with her currently    Social Drivers of Health   Financial Resource Strain: Low Risk  (07/23/2022)   Overall Financial Resource Strain (CARDIA)    Difficulty of Paying Living Expenses: Not very hard  Food Insecurity: No Food Insecurity (07/23/2022)   Hunger Vital Sign    Worried About Running Out of Food in the Last Year: Never true    Ran Out of Food in the Last Year: Never true  Transportation Needs: No Transportation Needs (07/23/2022)   PRAPARE - Administrator, Civil Service (Medical): No    Lack of Transportation (Non-Medical): No  Physical Activity: Insufficiently Active (07/23/2022)   Exercise Vital Sign    Days of Exercise per Week: 3 days    Minutes of Exercise per Session: 30 min  Stress: No Stress Concern Present (07/23/2022)   [employer] of Occupational Health - Occupational Stress Questionnaire    Feeling of Stress : Only a little  Social Connections: Unknown (07/23/2022)   Social Connection and Isolation Panel [NHANES]    Frequency of Communication with Friends and Family: Twice a week    Frequency of Social Gatherings with Friends and Family: Not on file    Attends Religious Services: More than 4 times per year    Active Member of [employer] or Organizations: No    Attends Banker Meetings: Never    Marital Status: Divorced     Family History: The patient's family history includes Colon cancer in her sister; Hyperlipidemia in her  sister; Hypertension in her sister; Prostate cancer in her father; Stroke in her mother. There is no history of Breast cancer.  ROS:   Please see the history of present illness.     All other systems reviewed and are negative.  EKGs/Labs/Other Studies Reviewed:    The following studies were reviewed today:  EKG Interpretation Date/Time:  Wednesday August 06 2023 10:33:50 EDT Ventricular Rate:  67 PR Interval:  136 QRS Duration:  72 QT Interval:  412 QTC Calculation: 435 R Axis:   -17  Text Interpretation: Normal sinus rhythm Normal ECG Confirmed by Constancia Delton ([id number]) on 08/06/2023 10:39:10 AM    Recent Labs: 05/26/2023: ALT 15; BUN 12; Creatinine, Ser 0.70; Hemoglobin 13.1; Magnesium 2.0; Platelets 175; Potassium 4.1; Sodium 141;  TSH 4.290  Recent Lipid Panel    Component Value Date/Time   CHOL 137 05/26/2023 0923   CHOL 185 01/24/2015 0831   TRIG 121 05/26/2023 0923   TRIG 133 01/24/2015 0831   HDL 46 05/26/2023 0923   CHOLHDL 4.0 01/08/2018 1112   VLDL 27 01/24/2015 0831   LDLCALC 69 05/26/2023 0923     Risk Assessment/Calculations:      Physical Exam:    VS:  BP 118/80   Pulse 67   Ht 5\' 2"  (1.575 m)   Wt 150 lb (68 kg)   LMP  (LMP Unknown)   SpO2 95%   BMI 27.44 kg/m     Wt Readings from Last 3 Encounters:  08/06/23 150 lb (68 kg)  05/26/23 149 lb 9.6 oz (67.9 kg)  04/11/23 145 lb (65.8 kg)     GEN:  Well nourished, well developed in no acute distress HEENT: Normal NECK: No JVD; No carotid bruits CARDIAC: RRR, no murmurs, rubs, gallops RESPIRATORY:  Clear to auscultation without rales, wheezing or rhonchi  ABDOMEN: Soft, non-tender, non-distended MUSCULOSKELETAL:  No edema; No deformity  SKIN: Warm and dry NEUROLOGIC:  Alert and oriented x 3 PSYCHIATRIC:  Normal affect   ASSESSMENT:    1. Coronary artery disease involving native heart, unspecified vessel or lesion type, unspecified whether angina present   2. Pure hypercholesterolemia      PLAN:    In order of problems listed above:  Nonobstructive CAD, moderate proximal LAD, mild RCA stenosis on CCTA 9/24.  Chest pain improved/resolved with metoprolol  but patient complains of cold sweats.  Stop Toprol -XL, increase Imdur  to 60 mg daily.  Monitor for symptom improvement.  Continue Plavix  75 mg daily, Crestor  40 mg daily, Zetia  10 mg daily.  Echo with normal EF 60 to 65%.   Hyperlipidemia, cholesterol controlled, continue Crestor  40 mg daily, Zetia  10 mg daily.  Follow-up in 2 months.     Medication Adjustments/Labs and Tests Ordered: Current medicines are reviewed at length with the patient today.  Concerns regarding medicines are outlined above.  Orders Placed This Encounter  Procedures   EKG 12-Lead   Meds ordered this encounter  Medications   isosorbide  mononitrate (IMDUR ) 60 MG 24 hr tablet    Sig: Take 1 tablet (60 mg total) by mouth daily.    Dispense:  90 tablet    Refill:  3    Patient Instructions  Medication Instructions:  Your physician recommends the following medication changes.  STOP TAKING: Metoprolol   INCREASE: Isosorbide  mononitrate to 60 mg   *If you need a refill on your cardiac medications before your next appointment, please call your pharmacy*  Lab Work: No labs ordered today  If you have labs (blood work) drawn today and your tests are completely normal, you will receive your results only by: MyChart Message (if you have MyChart) OR A paper copy in the mail If you have any lab test that is abnormal or we need to change your treatment, we will call you to review the results.  Testing/Procedures: No test ordered today   Follow-Up: At [facility], you and your health needs are our priority.  As part of our continuing mission to provide you with exceptional heart care, our providers are all part of one team.  This team includes your primary Cardiologist (physician) and Advanced Practice Providers or APPs (Physician  Assistants and Nurse Practitioners) who all work together to provide you with the care you need, when you need  it.  Your next appointment:   2 month(s)  Provider:   You may see Constancia Delton, MD or one of the following Advanced Practice Providers on your designated Care Team:   Laneta Pintos, NP Gildardo Labrador, PA-C Varney Gentleman, PA-C Cadence [place], PA-C Ronald Cockayne, NP Morey Ar, NP    We recommend signing up for the patient portal called "MyChart".  Sign up information is provided on this After Visit Summary.  MyChart is used to connect with patients for Virtual Visits (Telemedicine).  Patients are able to view lab/test results, encounter notes, upcoming appointments, etc.  Non-urgent messages can be sent to your provider as well.   To learn more about what you can do with MyChart, go to [email or web address].          Signed, Constancia Delton, MD  08/06/2023 11:45 AM    [facility]

## 2023-08-06 NOTE — Patient Instructions (Signed)
 Medication Instructions:  Your physician recommends the following medication changes.  STOP TAKING: Metoprolol   INCREASE: Isosorbide  mononitrate to 60 mg   *If you need a refill on your cardiac medications before your next appointment, please call your pharmacy*  Lab Work: No labs ordered today  If you have labs (blood work) drawn today and your tests are completely normal, you will receive your results only by: MyChart Message (if you have MyChart) OR A paper copy in the mail If you have any lab test that is abnormal or we need to change your treatment, we will call you to review the results.  Testing/Procedures: No test ordered today   Follow-Up: At [facility], you and your health needs are our priority.  As part of our continuing mission to provide you with exceptional heart care, our providers are all part of one team.  This team includes your primary Cardiologist (physician) and Advanced Practice Providers or APPs (Physician Assistants and Nurse Practitioners) who all work together to provide you with the care you need, when you need it.  Your next appointment:   2 month(s)  Provider:   You may see Constancia Delton, MD or one of the following Advanced Practice Providers on your designated Care Team:   Laneta Pintos, NP Gildardo Labrador, PA-C Varney Gentleman, PA-C Cadence [place], PA-C Ronald Cockayne, NP Morey Ar, NP    We recommend signing up for the patient portal called "MyChart".  Sign up information is provided on this After Visit Summary.  MyChart is used to connect with patients for Virtual Visits (Telemedicine).  Patients are able to view lab/test results, encounter notes, upcoming appointments, etc.  Non-urgent messages can be sent to your provider as well.   To learn more about what you can do with MyChart, go to [email or web address].

## 2023-08-19 ENCOUNTER — Ambulatory Visit: Admitting: Emergency Medicine

## 2023-08-19 VITALS — Ht 62.0 in | Wt 150.0 lb

## 2023-08-19 DIAGNOSIS — Z0000 Encounter for general adult medical examination without abnormal findings: Secondary | ICD-10-CM

## 2023-08-19 NOTE — Patient Instructions (Signed)
 Tabitha Larson , Thank you for taking time out of your busy schedule to complete your Annual Wellness Visit with me. I enjoyed our conversation and look forward to speaking with you again next year. I, as well as your care team,  appreciate your ongoing commitment to your health goals. Please review the following plan we discussed and let me know if I can assist you in the future. Your Game plan/ To Do List    Referrals: None  Follow up Visits: Next Medicare AWV with our clinical staff: 08/24/24 @ 11:20am (PHONE VISIT)   Have you seen your provider in the last 6 months (3 months if uncontrolled diabetes)? Yes Next Office Visit with your provider: 11/26/23 @ 9:00am with Jolene Cannady, NP  Clinician Recommendations: Get the shingles vaccines (pharmacy) at your convenience. Aim for 30 minutes of exercise or brisk walking, 6-8 glasses of water , and 5 servings of fruits and vegetables each day.       This is a list of the screening recommended for you and due dates:  Health Maintenance  Topic Date Due   COVID-19 Vaccine (4 - 2024-25 season) 11/03/2022   Zoster (Shingles) Vaccine (1 of 2) 08/26/2023*   Flu Shot  10/03/2023   Mammogram  07/24/2024   Medicare Annual Wellness Visit  08/18/2024   Colon Cancer Screening  05/16/2025   DEXA scan (bone density measurement)  08/30/2027   DTaP/Tdap/Td vaccine (4 - Td or Tdap) 07/17/2032   Pneumococcal Vaccine for age over 56  Completed   Hepatitis C Screening  Completed   HPV Vaccine  Aged Out   Meningitis B Vaccine  Aged Out  *Topic was postponed. The date shown is not the original due date.    Advanced directives: (Copy Requested) Please bring a copy of your health care power of attorney and living will to the office to be added to your chart at your convenience. You can mail to [facility] [address] [place], [place] [address] or email to [email or web address] Advance Care Planning is important because it:  [x]  Makes sure  you receive the medical care that is consistent with your values, goals, and preferences  [x]  It provides guidance to your family and loved ones and reduces their decisional burden about whether or not they are making the right decisions based on your wishes.  Follow the link provided in your after visit summary or read over the paperwork we have mailed to you to help you started getting your Advance Directives in place. If you need assistance in completing these, please reach out to us  so that we can help you!  See attachments for Preventive Care and Fall Prevention Tips.   Fall Prevention in the Home, Adult Falls can cause injuries and affect people of all ages. There are many simple things that you can do to make your home safe and to help prevent falls. If you need it, ask for help making these changes. What actions can I take to prevent falls? General information Use good lighting in all rooms. Make sure to: Replace any light bulbs that burn out. Turn on lights if it is dark and use night-lights. Keep items that you use often in easy-to-reach places. Lower the shelves around your home if needed. Move furniture so that there are clear paths around it. Do not keep throw rugs or other things on the floor that can make you trip. If any of your floors are uneven, fix them. Add color or  contrast paint or tape to clearly mark and help you see: Grab bars or handrails. First and last steps of staircases. Where the edge of each step is. If you use a ladder or stepladder: Make sure that it is fully opened. Do not climb a closed ladder. Make sure the sides of the ladder are locked in place. Have someone hold the ladder while you use it. Know where your pets are as you move through your home. What can I do in the bathroom?     Keep the floor dry. Clean up any water  that is on the floor right away. Remove soap buildup in the bathtub or shower. Buildup makes bathtubs and showers  slippery. Use non-skid mats or decals on the floor of the bathtub or shower. Attach bath mats securely with double-sided, non-slip rug tape. If you need to sit down while you are in the shower, use a non-slip stool. Install grab bars by the toilet and in the bathtub and shower. Do not use towel bars as grab bars. What can I do in the bedroom? Make sure that you have a light by your bed that is easy to reach. Do not use any sheets or blankets on your bed that hang to the floor. Have a firm bench or chair with side arms that you can use for support when you get dressed. What can I do in the kitchen? Clean up any spills right away. If you need to reach something above you, use a sturdy step stool that has a grab bar. Keep electrical cables out of the way. Do not use floor polish or wax that makes floors slippery. What can I do with my stairs? Do not leave anything on the stairs. Make sure that you have a light switch at the top and the bottom of the stairs. Have them installed if you do not have them. Make sure that there are handrails on both sides of the stairs. Fix handrails that are broken or loose. Make sure that handrails are as long as the staircases. Install non-slip stair treads on all stairs in your home if they do not have carpet. Avoid having throw rugs at the top or bottom of stairs, or secure the rugs with carpet tape to prevent them from moving. Choose a carpet design that does not hide the edge of steps on the stairs. Make sure that carpet is firmly attached to the stairs. Fix any carpet that is loose or worn. What can I do on the outside of my home? Use bright outdoor lighting. Repair the edges of walkways and driveways and fix any cracks. Clear paths of anything that can make you trip, such as tools or rocks. Add color or contrast paint or tape to clearly mark and help you see high doorway thresholds. Trim any bushes or trees on the main path into your home. Check that  handrails are securely fastened and in good repair. Both sides of all steps should have handrails. Install guardrails along the edges of any raised decks or porches. Have leaves, snow, and ice cleared regularly. Use sand, salt, or ice melt on walkways during winter months if you live where there is ice and snow. In the garage, clean up any spills right away, including grease or oil spills. What other actions can I take? Review your medicines with your health care provider. Some medicines can make you confused or feel dizzy. This can increase your chance of falling. Wear closed-toe shoes that fit well and  support your feet. Wear shoes that have rubber soles and low heels. Use a cane, walker, scooter, or crutches that help you move around if needed. Talk with your provider about other ways that you can decrease your risk of falls. This may include seeing a physical therapist to learn to do exercises to improve movement and strength. Where to find more information Centers for Disease Control and Prevention, STEADI: TonerPromos.no [employer] on Aging: BaseRingTones.pl National Institute on Aging: BaseRingTones.pl Contact a health care provider if: You are afraid of falling at home. You feel weak, drowsy, or dizzy at home. You fall at home. Get help right away if you: Lose consciousness or have trouble moving after a fall. Have a fall that causes a head injury. These symptoms may be an emergency. Get help right away. Call 911. Do not wait to see if the symptoms will go away. Do not drive yourself to the hospital. This information is not intended to replace advice given to you by your health care provider. Make sure you discuss any questions you have with your health care provider. Document Revised: 10/22/2021 Document Reviewed: 10/22/2021 Elsevier Patient Education  2024 [employer].

## 2023-08-19 NOTE — Progress Notes (Signed)
 Subjective:   Tabitha Larson is a 73 y.o. who presents for a Medicare Wellness preventive visit.  As a reminder, Annual Wellness Visits don't include a physical exam, and some assessments may be limited, especially if this visit is performed virtually. We may recommend an in-person follow-up visit with your provider if needed.  Visit Complete: Virtual I connected with  Tabitha Larson on 08/19/23 by a audio enabled telemedicine application and verified that I am speaking with the correct person using two identifiers.  Patient Location: Home  Provider Location: Home Office  I discussed the limitations of evaluation and management by telemedicine. The patient expressed understanding and agreed to proceed.  Vital Signs: Because this visit was a virtual/telehealth visit, some criteria may be missing or patient reported. Any vitals not documented were not able to be obtained and vitals that have been documented are patient reported.  VideoDeclined- This patient declined Librarian, academic. Therefore the visit was completed with audio only.  Persons Participating in Visit: Patient.  AWV Questionnaire: No: Patient Medicare AWV questionnaire was not completed prior to this visit.  Cardiac Risk Factors include: advanced age (>47men, >18 women);dyslipidemia;hypertension     Objective:    Today's Vitals   08/19/23 1122  Weight: 150 lb (68 kg)  Height: 5' 2 (1.575 m)   Body mass index is 27.44 kg/m.     08/19/2023   11:35 AM 07/23/2022    9:09 AM 07/19/2022    9:48 AM 06/25/2021    9:06 AM 06/23/2020    9:02 AM 05/16/2020    9:39 AM 06/14/2019    9:36 AM  Advanced Directives  Does Patient Have a Medical Advance Directive? Yes No Yes Yes Yes Yes Yes  Type of Forensic scientist of [place];Living will Healthcare Power of [employer] of [place];Living will Healthcare Power of [place];Living will    Does patient want to make changes to medical advance directive? No - Patient declined      Yes (MAU/Ambulatory/Procedural Areas - Information given)  Copy of Healthcare Power of Attorney in Chart? No - copy requested  No - copy requested No - copy requested No - copy requested No - copy requested   Would patient like information on creating a medical advance directive?  No - Patient declined         Current Medications (verified) Outpatient Encounter Medications as of 08/19/2023  Medication Sig   alendronate  (FOSAMAX ) 70 MG tablet Take 1 tablet (70 mg total) by mouth every 7 (seven) days. Take with a full glass of water  on an empty stomach.  Sit upright for one hour after taking.   clopidogrel  (PLAVIX ) 75 MG tablet Take 1 tablet (75 mg total) by mouth daily.   DULoxetine  (CYMBALTA ) 60 MG capsule Take 1 capsule (60 mg total) by mouth daily.   ezetimibe  (ZETIA ) 10 MG tablet Take 1 tablet (10 mg total) by mouth daily.   isosorbide  mononitrate (IMDUR ) 60 MG 24 hr tablet Take 1 tablet (60 mg total) by mouth daily.   pantoprazole  (PROTONIX ) 20 MG tablet Take 1 tablet by mouth once daily   rosuvastatin  (CRESTOR ) 40 MG tablet Take 1 tablet (40 mg total) by mouth daily.   No facility-administered encounter medications on file as of 08/19/2023.    Allergies (verified) Patient has no known allergies.   History: Past Medical History:  Diagnosis Date   Ankle fracture    Anxiety    Depression  GERD (gastroesophageal reflux disease)    Hyperlipidemia    Migraine    Osteoporosis    Wears dentures    full upper and lower   Past Surgical History:  Procedure Laterality Date   ANKLE FRACTURE SURGERY     BROW LIFT Bilateral 07/19/2022   Procedure: BLEPHAROPLASTY UPPER EYELID; W/EXCESS SKIN BLEPHAROPTOSIS REPAIR; RESECT EX BILATERAL;  Surgeon: Zacarias Hermann, MD;  Location: [facility];  Service: Ophthalmology;  Laterality: Bilateral;   CATARACT EXTRACTION Right 01/2014   CATARACT  EXTRACTION W/PHACO Left 03/26/2016   Procedure: CATARACT EXTRACTION PHACO AND INTRAOCULAR LENS PLACEMENT (IOC)  left;  Surgeon: Rosa College, MD;  Location: [facility];  Service: Ophthalmology;  Laterality: Left;   COLONOSCOPY  02/04/2014   COLONOSCOPY WITH PROPOFOL  N/A 07/28/2015   Procedure: COLONOSCOPY WITH PROPOFOL ;  Surgeon: Marnee Sink, MD;  Location: [facility];  Service: Endoscopy;  Laterality: N/A;  No anesthesia   COLONOSCOPY WITH PROPOFOL  N/A 05/16/2020   Procedure: COLONOSCOPY WITH PROPOFOL ;  Surgeon: Marnee Sink, MD;  Location: [facility];  Service: Endoscopy;  Laterality: N/A;   ESOPHAGOGASTRODUODENOSCOPY (EGD) WITH PROPOFOL  N/A 12/17/2018   Procedure: ESOPHAGOGASTRODUODENOSCOPY (EGD) WITH Dilation;  Surgeon: Marnee Sink, MD;  Location: [facility];  Service: Endoscopy;  Laterality: N/A;  requests mid-morning arrival   MANDIBLE SURGERY     POLYPECTOMY  07/28/2015   Procedure: POLYPECTOMY;  Surgeon: Marnee Sink, MD;  Location: [facility];  Service: Endoscopy;;   Family History  Problem Relation Age of Onset   Stroke Mother    Prostate cancer Father    Colon cancer Sister    Hypertension Sister    Hyperlipidemia Sister    Breast cancer Neg Hx    Social History   Socioeconomic History   Marital status: Divorced    Spouse name: Not on file   Number of children: 0   Years of education: Not on file   Highest education level: High school graduate  Occupational History   Occupation: retired  Tobacco Use   Smoking status: Former    Current packs/day: 0.00    Average packs/day: 2.0 packs/day for 14.8 years (29.7 ttl pk-yrs)    Types: Cigarettes    Start date: 26    Quit date: 01/05/1984    Years since quitting: 39.6   Smokeless tobacco: Never  Vaping Use   Vaping status: Never Used  Substance and Sexual Activity   Alcohol use: No    Alcohol/week: 0.0 standard drinks of alcohol   Drug use: No   Sexual activity: Not Currently     Birth control/protection: Post-menopausal  Other Topics Concern   Not on file  Social History Narrative   Niece lives in area   No kids   Has friends close by   [place]ather[place] live with her currently    Social Drivers of Health   Financial Resource Strain: Low Risk  (08/19/2023)   Overall Financial Resource Strain (CARDIA)    Difficulty of Paying Living Expenses: Not hard at all  Food Insecurity: No Food Insecurity (08/19/2023)   Hunger Vital Sign    Worried About Running Out of Food in the Last Year: Never true    Ran Out of Food in the Last Year: Never true  Transportation Needs: No Transportation Needs (08/19/2023)   PRAPARE - Administrator, Civil Service (Medical): No    Lack of Transportation (Non-Medical): No  Physical Activity: Inactive (08/19/2023)   Exercise Vital Sign  Days of Exercise per Week: 0 days    Minutes of Exercise per Session: 0 min  Stress: Stress Concern Present (08/19/2023)   [employer] of Occupational Health - Occupational Stress Questionnaire    Feeling of Stress: Rather much  Social Connections: Socially Isolated (08/19/2023)   Social Connection and Isolation Panel    Frequency of Communication with Friends and Family: Twice a week    Frequency of Social Gatherings with Friends and Family: More than three times a week    Attends Religious Services: Never    Database administrator or Organizations: No    Attends Engineer, structural: Never    Marital Status: Divorced    Tobacco Counseling Counseling given: Not Answered    Clinical Intake:  Pre-visit preparation completed: Yes  Pain : No/denies pain     BMI - recorded: 27.44 Nutritional Status: BMI 25 -29 Overweight Nutritional Risks: None Diabetes: No  Lab Results  Component Value Date   HGBA1C 6.2 (H) 05/26/2023   HGBA1C 6.0 (H) 07/18/2022   HGBA1C 6.0 (H) 01/17/2022     How often do you need to have someone help you when you read instructions, pamphlets,  or other written materials from your doctor or pharmacy?: 1 - Never  Interpreter Needed?: No  Information entered by :: Jaunita Messier, CMA   Activities of Daily Living     08/19/2023   11:24 AM  In your present state of health, do you have any difficulty performing the following activities:  Hearing? 0  Vision? 0  Difficulty concentrating or making decisions? 1  Comment sometimes  Walking or climbing stairs? 0  Dressing or bathing? 0  Doing errands, shopping? 0  Preparing Food and eating ? N  Using the Toilet? N  In the past six months, have you accidently leaked urine? Y  Do you have problems with loss of bowel control? N  Managing your Medications? N  Managing your Finances? N  Housekeeping or managing your Housekeeping? N    Patient Care Team: Cannady, Jolene T, NP as PCP - General (Nurse Practitioner) Constancia Delton, MD as PCP - Cardiology (Cardiology) Rosa College, MD as Consulting Physician (Ophthalmology)  I have updated your Care Teams any recent Medical Services you may have received from other providers in the past year.     Assessment:   This is a routine wellness examination for [place].  Hearing/Vision screen Hearing Screening - Comments:: Denies hearing loss Vision Screening - Comments:: Gets routine eye exam, Dr. Dusty Gin, [place]   Goals Addressed             This Visit's Progress    Patient Stated       Eat healthier and lose some weight       Depression Screen     08/19/2023   11:33 AM 05/26/2023    9:21 AM 11/25/2022   10:43 AM 10/24/2022   10:13 AM 08/29/2022   11:21 AM 07/23/2022    9:06 AM 07/18/2022    9:48 AM  PHQ 2/9 Scores  PHQ - 2 Score 1 2 2 2 2 1 2   PHQ- 9 Score 4 10 13 8 12 3 11     Fall Risk     08/19/2023   11:36 AM 05/26/2023    9:20 AM 11/25/2022   10:43 AM 10/24/2022   10:12 AM 08/29/2022   11:21 AM  Fall Risk   Falls in the past year? 1 0 1 1 1  Number falls in past yr: 0 0 1 0 1  Injury with  Fall? 0 0 0 0 0  Risk for fall due to : History of fall(s);Impaired balance/gait;Orthopedic patient No Fall Risks History of fall(s) No Fall Risks History of fall(s)  Follow up Falls evaluation completed;Education provided Falls evaluation completed Falls evaluation completed Falls evaluation completed Falls evaluation completed    MEDICARE RISK AT HOME:  Medicare Risk at Home Any stairs in or around the home?: Yes If so, are there any without handrails?: No Home free of loose throw rugs in walkways, pet beds, electrical cords, etc?: Yes Adequate lighting in your home to reduce risk of falls?: Yes Life alert?: No Use of a cane, walker or w/c?: No Grab bars in the bathroom?: No Shower chair or bench in shower?: No Elevated toilet seat or a handicapped toilet?: No  TIMED UP AND GO:  Was the test performed?  No  Cognitive Function: 6CIT completed        08/19/2023   11:38 AM 07/23/2022    9:15 AM 06/25/2021    9:04 AM 06/23/2020    9:07 AM 06/14/2019    9:41 AM  6CIT Screen  What Year? 0 points 0 points 0 points 0 points 0 points  What month? 0 points 0 points 0 points 0 points 0 points  What time? 0 points 0 points 0 points 0 points 0 points  Count back from 20 0 points 0 points 0 points 0 points 0 points  Months in reverse 0 points 0 points 0 points 0 points 0 points  Repeat phrase 0 points 0 points 0 points 0 points 0 points  Total Score 0 points 0 points 0 points 0 points 0 points    Immunizations Immunization History  Administered Date(s) Administered   DTaP 08/27/2010   Fluad Quad(high Dose 65+) 11/11/2018, 03/21/2020, 12/06/2020, 12/17/2021   Fluad Trivalent(High Dose 65+) 11/25/2022   Influenza, High Dose Seasonal PF 11/11/2016, 01/08/2018   Influenza,inj,Quad PF,6+ Mos 01/24/2015   Influenza-Unspecified 02/11/2014, 12/12/2015   PFIZER(Purple Top)SARS-COV-2 Vaccination 05/31/2019, 06/29/2019, 12/31/2019   Pneumococcal Conjugate-13 02/11/2014   Pneumococcal  Polysaccharide-23 05/01/2015   Td 07/18/2022   Tdap 08/27/2010   Zoster, Live 09/11/2011    Screening Tests Health Maintenance  Topic Date Due   COVID-19 Vaccine (4 - 2024-25 season) 11/03/2022   Zoster Vaccines- Shingrix (1 of 2) 08/26/2023 (Originally 12/12/1998)   INFLUENZA VACCINE  10/03/2023   MAMMOGRAM  07/24/2024   Medicare Annual Wellness (AWV)  08/18/2024   Colonoscopy  05/16/2025   DEXA SCAN  08/30/2027   DTaP/Tdap/Td (4 - Td or Tdap) 07/17/2032   Pneumococcal Vaccine: 50+ Years  Completed   Hepatitis C Screening  Completed   HPV VACCINES  Aged Out   Meningococcal B Vaccine  Aged Out    Health Maintenance  Health Maintenance Due  Topic Date Due   COVID-19 Vaccine (4 - 2024-25 season) 11/03/2022   Health Maintenance Items Addressed: See Nurse Notes at the end of this note  Additional Screening:  Vision Screening: Recommended annual ophthalmology exams for early detection of glaucoma and other disorders of the eye. Would you like a referral to an eye doctor? No    Dental Screening: Recommended annual dental exams for proper oral hygiene  Community Resource Referral / Chronic Care Management: CRR required this visit?  No   CCM required this visit?  No   Plan:    I have personally reviewed and noted the following in the  patient's chart:   Medical and social history Use of alcohol, tobacco or illicit drugs  Current medications and supplements including opioid prescriptions. Patient is not currently taking opioid prescriptions. Functional ability and status Nutritional status Physical activity Advanced directives List of other physicians Hospitalizations, surgeries, and ER visits in previous 12 months Vitals Screenings to include cognitive, depression, and falls Referrals and appointments  In addition, I have reviewed and discussed with patient certain preventive protocols, quality metrics, and best practice recommendations. A written personalized  care plan for preventive services as well as general preventive health recommendations were provided to patient.   Jaunita Messier, CMA   08/19/2023   After Visit Summary: (Mail) Due to this being a telephonic visit, the after visit summary with patients personalized plan was offered to patient via mail   Notes:  Needs Shingrix vaccines (pharmacy) Declined covid vaccines

## 2023-08-24 ENCOUNTER — Other Ambulatory Visit: Payer: Self-pay | Admitting: Nurse Practitioner

## 2023-08-26 NOTE — Telephone Encounter (Signed)
 Requested Prescriptions  Pending Prescriptions Disp Refills   DULoxetine  (CYMBALTA ) 60 MG capsule [Pharmacy Med Name: DULoxetine  HCl 60 MG Oral Capsule Delayed Release Particles] 90 capsule 0    Sig: Take 1 capsule by mouth once daily     Psychiatry: Antidepressants - SNRI - duloxetine  Passed - 08/26/2023  2:21 PM      Passed - Cr in normal range and within 360 days    Creatinine, Ser  Date Value Ref Range Status  05/26/2023 0.70 0.57 - 1.00 mg/dL Final         Passed - eGFR is 30 or above and within 360 days    GFR calc Af Amer  Date Value Ref Range Status  03/08/2020 103 >59 mL/min/1.73 Final    Comment:    **In accordance with recommendations from the NKF-ASN Task force,**   [facility] is in the process of updating its eGFR calculation to the   2021 CKD-EPI creatinine equation that estimates kidney function   without a race variable.    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

## 2023-10-10 ENCOUNTER — Ambulatory Visit: Attending: Cardiology | Admitting: Cardiology

## 2023-10-10 ENCOUNTER — Encounter: Payer: Self-pay | Admitting: Cardiology

## 2023-10-10 VITALS — BP 145/70 | HR 60 | Ht 62.0 in | Wt 148.4 lb

## 2023-10-10 DIAGNOSIS — E7800 Pure hypercholesterolemia, unspecified: Secondary | ICD-10-CM

## 2023-10-10 DIAGNOSIS — I2510 Atherosclerotic heart disease of native coronary artery without angina pectoris: Secondary | ICD-10-CM

## 2023-10-10 NOTE — Progress Notes (Signed)
 Cardiology Office Note:    Date:  10/10/2023   ID:  Tabitha Larson, DOB 05/07/1950, MRN [id number]  PCP:  Valerio Melanie DASEN, NP   [facility] Providers Cardiologist:  Redell Cave, MD     Referring MD: Valerio Melanie DASEN, NP   No chief complaint on file.   History of Present Illness:    Tabitha Larson is a 73 y.o. female with a hx of nonobstructive CAD (moderate proximal LAD, mild RCA CCTA 9/24 ), hyperlipidemia, GERD presenting with for follow-up.  Denies chest pain or shortness of breath.  Imdur  increased to 60 mg after last visit.  Tolerating medications no side effects.  Feels well, has no concerns at this time.  Still having cold sweats which she initially attributes to metoprolol .  Metoprolol  was stopped but symptoms have persisted.  Plans to follow-up with primary care physician.   Prior notes/testing Coronary CT 11/2022 showed moderate LAD, mild RCA stenosis, FFR CT nonsignificant. Echo 11/2022 showed normal EF 60 to 65%.   Past Medical History:  Diagnosis Date   Ankle fracture    Anxiety    Depression    GERD (gastroesophageal reflux disease)    Hyperlipidemia    Migraine    Osteoporosis    Wears dentures    full upper and lower    Past Surgical History:  Procedure Laterality Date   ANKLE FRACTURE SURGERY     BROW LIFT Bilateral 07/19/2022   Procedure: BLEPHAROPLASTY UPPER EYELID; W/EXCESS SKIN BLEPHAROPTOSIS REPAIR; RESECT EX BILATERAL;  Surgeon: Ashley Greig HERO, MD;  Location: [facility];  Service: Ophthalmology;  Laterality: Bilateral;   CATARACT EXTRACTION Right 01/2014   CATARACT EXTRACTION W/PHACO Left 03/26/2016   Procedure: CATARACT EXTRACTION PHACO AND INTRAOCULAR LENS PLACEMENT (IOC)  left;  Surgeon: Adine Oneil Novak, MD;  Location: [facility];  Service: Ophthalmology;  Laterality: Left;   COLONOSCOPY  02/04/2014   COLONOSCOPY WITH PROPOFOL  N/A 07/28/2015   Procedure: COLONOSCOPY WITH PROPOFOL ;  Surgeon: Rogelia Copping,  MD;  Location: [facility];  Service: Endoscopy;  Laterality: N/A;  No anesthesia   COLONOSCOPY WITH PROPOFOL  N/A 05/16/2020   Procedure: COLONOSCOPY WITH PROPOFOL ;  Surgeon: Copping Rogelia, MD;  Location: [facility] END[facility];  Service: Endoscopy;  Laterality: N/A;   ESOPHAGOGASTRODUODENOSCOPY (EGD) WITH PROPOFOL  N/A 12/17/2018   Procedure: ESOPHAGOGASTRODUODENOSCOPY (EGD) WITH Dilation;  Surgeon: Copping Rogelia, MD;  Location: [facility];  Service: Endoscopy;  Laterality: N/A;  requests mid-morning arrival   MANDIBLE SURGERY     POLYPECTOMY  07/28/2015   Procedure: POLYPECTOMY;  Surgeon: Rogelia Copping, MD;  Location: [facility];  Service: Endoscopy;;    Current Medications: Current Meds  Medication Sig   alendronate  (FOSAMAX ) 70 MG tablet Take 1 tablet (70 mg total) by mouth every 7 (seven) days. Take with a full glass of water  on an empty stomach.  Sit upright for one hour after taking.   clopidogrel  (PLAVIX ) 75 MG tablet Take 1 tablet (75 mg total) by mouth daily.   DULoxetine  (CYMBALTA ) 60 MG capsule Take 1 capsule by mouth once daily   ezetimibe  (ZETIA ) 10 MG tablet Take 1 tablet (10 mg total) by mouth daily.   isosorbide  mononitrate (IMDUR ) 60 MG 24 hr tablet Take 1 tablet (60 mg total) by mouth daily.   pantoprazole  (PROTONIX ) 20 MG tablet Take 1 tablet by mouth once daily   rosuvastatin  (CRESTOR ) 40 MG tablet Take 1 tablet (40 mg total) by mouth daily.     Allergies:  Patient has no known allergies.   Social History   Socioeconomic History   Marital status: Divorced    Spouse name: Not on file   Number of children: 0   Years of education: Not on file   Highest education level: High school graduate  Occupational History   Occupation: retired  Tobacco Use   Smoking status: Former    Current packs/day: 0.00    Average packs/day: 2.0 packs/day for 14.8 years (29.7 ttl pk-yrs)    Types: Cigarettes    Start date: 73    Quit date: 01/05/1984    Years since  quitting: 39.7   Smokeless tobacco: Never  Vaping Use   Vaping status: Never Used  Substance and Sexual Activity   Alcohol use: No    Alcohol/week: 0.0 standard drinks of alcohol   Drug use: No   Sexual activity: Not Currently    Birth control/protection: Post-menopausal  Other Topics Concern   Not on file  Social History Narrative   Niece lives in area   No kids   Has friends close by   Center live with her currently    Social Drivers of Health   Financial Resource Strain: Low Risk  (08/19/2023)   Overall Financial Resource Strain (CARDIA)    Difficulty of Paying Living Expenses: Not hard at all  Food Insecurity: No Food Insecurity (08/19/2023)   Hunger Vital Sign    Worried About Running Out of Food in the Last Year: Never true    Ran Out of Food in the Last Year: Never true  Transportation Needs: No Transportation Needs (08/19/2023)   PRAPARE - Administrator, Civil Service (Medical): No    Lack of Transportation (Non-Medical): No  Physical Activity: Inactive (08/19/2023)   Exercise Vital Sign    Days of Exercise per Week: 0 days    Minutes of Exercise per Session: 0 min  Stress: Stress Concern Present (08/19/2023)   [employer] of Occupational Health - Occupational Stress Questionnaire    Feeling of Stress: Rather much  Social Connections: Socially Isolated (08/19/2023)   Social Connection and Isolation Panel    Frequency of Communication with Friends and Family: Twice a week    Frequency of Social Gatherings with Friends and Family: More than three times a week    Attends Religious Services: Never    Database administrator or Organizations: No    Attends Banker Meetings: Never    Marital Status: Divorced     Family History: The patient's family history includes Colon cancer in her sister; Hyperlipidemia in her sister; Hypertension in her sister; Prostate cancer in her father; Stroke in her mother. There is no history of Breast  cancer.  ROS:   Please see the history of present illness.     All other systems reviewed and are negative.  EKGs/Labs/Other Studies Reviewed:    The following studies were reviewed today:  EKG Interpretation Date/Time:  Friday October 10 2023 11:26:25 EDT Ventricular Rate:  60 PR Interval:  134 QRS Duration:  74 QT Interval:  420 QTC Calculation: 420 R Axis:   -13  Text Interpretation: Normal sinus rhythm with sinus arrhythmia Normal ECG Confirmed by Darliss Rogue ([id number]) on 10/10/2023 11:57:05 AM    Recent Labs: 05/26/2023: ALT 15; BUN 12; Creatinine, Ser 0.70; Hemoglobin 13.1; Magnesium 2.0; Platelets 175; Potassium 4.1; Sodium 141; TSH 4.290  Recent Lipid Panel    Component Value Date/Time   CHOL 137 05/26/2023 0923  CHOL 185 01/24/2015 0831   TRIG 121 05/26/2023 0923   TRIG 133 01/24/2015 0831   HDL 46 05/26/2023 0923   CHOLHDL 4.0 01/08/2018 1112   VLDL 27 01/24/2015 0831   LDLCALC 69 05/26/2023 0923     Risk Assessment/Calculations:      Physical Exam:    VS:  BP (!) 145/70 (BP Location: Left Arm, Patient Position: Sitting, Cuff Size: Normal)   Pulse 60   Ht 5' 2 (1.575 m)   Wt 148 lb 6.4 oz (67.3 kg)   LMP  (LMP Unknown)   SpO2 95%   BMI 27.14 kg/m     Wt Readings from Last 3 Encounters:  10/10/23 148 lb 6.4 oz (67.3 kg)  08/19/23 150 lb (68 kg)  08/06/23 150 lb (68 kg)     GEN:  Well nourished, well developed in no acute distress HEENT: Normal NECK: No JVD; No carotid bruits CARDIAC: RRR, no murmurs, rubs, gallops RESPIRATORY:  Clear to auscultation without rales, wheezing or rhonchi  ABDOMEN: Soft, non-tender, non-distended MUSCULOSKELETAL:  No edema; No deformity  SKIN: Warm and dry NEUROLOGIC:  Alert and oriented x 3 PSYCHIATRIC:  Normal affect   ASSESSMENT:    1. Coronary artery disease involving native heart, unspecified vessel or lesion type, unspecified whether angina present   2. Pure hypercholesterolemia    PLAN:    In  order of problems listed above:  Nonobstructive CAD, moderate proximal LAD, mild RCA stenosis on CCTA 9/24.  Denies chest pain.  Continue Imdur  60 mg daily, Plavix  75 mg daily, Crestor  40 mg daily, Zetia  10 mg daily.  Echo with normal EF 60 to 65%.   Hyperlipidemia, cholesterol controlled, continue Crestor  40 mg daily, Zetia  10 mg daily.  Follow-up in 12 months.     Medication Adjustments/Labs and Tests Ordered: Current medicines are reviewed at length with the patient today.  Concerns regarding medicines are outlined above.  Orders Placed This Encounter  Procedures   EKG 12-Lead   No orders of the defined types were placed in this encounter.   Patient Instructions  Medication Instructions:  Your physician recommends that you continue on your current medications as directed. Please refer to the Current Medication list given to you today.  *If you need a refill on your cardiac medications before your next appointment, please call your pharmacy*  Lab Work: No labs ordered today  If you have labs (blood work) drawn today and your tests are completely normal, you will receive your results only by: MyChart Message (if you have MyChart) OR A paper copy in the mail If you have any lab test that is abnormal or we need to change your treatment, we will call you to review the results.  Testing/Procedures: No test ordered today   Follow-Up: At [facility], you and your health needs are our priority.  As part of our continuing mission to provide you with exceptional heart care, our providers are all part of one team.  This team includes your primary Cardiologist (physician) and Advanced Practice Providers or APPs (Physician Assistants and Nurse Practitioners) who all work together to provide you with the care you need, when you need it.  Your next appointment:   1 year(s)  Provider:   You may see Redell Cave, MD or one of the following Advanced Practice Providers on your  designated Care Team:   Lonni Meager, NP Lesley Maffucci, PA-C Bernardino Bring, PA-C Cadence Cayey, PA-C Tylene Lunch, NP Barnie Hila, NP  We recommend signing up for the patient portal called MyChart.  Sign up information is provided on this After Visit Summary.  MyChart is used to connect with patients for Virtual Visits (Telemedicine).  Patients are able to view lab/test results, encounter notes, upcoming appointments, etc.  Non-urgent messages can be sent to your provider as well.   To learn more about what you can do with MyChart, go to [email or web address].      Signed, Redell Cave, MD  10/10/2023 12:47 PM     [facility]

## 2023-10-10 NOTE — Patient Instructions (Signed)

## 2023-11-25 DIAGNOSIS — Z6827 Body mass index (BMI) 27.0-27.9, adult: Secondary | ICD-10-CM | POA: Diagnosis not present

## 2023-11-25 DIAGNOSIS — R5383 Other fatigue: Secondary | ICD-10-CM | POA: Diagnosis not present

## 2023-11-25 DIAGNOSIS — Z1152 Encounter for screening for COVID-19: Secondary | ICD-10-CM | POA: Diagnosis not present

## 2023-11-25 DIAGNOSIS — R051 Acute cough: Secondary | ICD-10-CM | POA: Diagnosis not present

## 2023-11-25 DIAGNOSIS — J069 Acute upper respiratory infection, unspecified: Secondary | ICD-10-CM | POA: Diagnosis not present

## 2023-11-26 ENCOUNTER — Ambulatory Visit: Admitting: Nurse Practitioner

## 2023-11-29 ENCOUNTER — Other Ambulatory Visit: Payer: Self-pay | Admitting: Nurse Practitioner

## 2023-12-02 NOTE — Telephone Encounter (Signed)
 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   [facility] [place], Jolene T, NP               rosuvastatin  (CRESTOR ) 40 MG tablet [Pharmacy Med Name: Rosuvastatin  Calcium  40 MG Oral Tablet] 90 tablet 1    Sig: Take 1 tablet by mouth once daily     Cardiovascular:  Antilipid - Statins 2 Failed - 12/02/2023 11:29 AM      Failed - Lipid Panel in normal range within the last 12 months    Cholesterol, Total  Date Value Ref Range Status  05/26/2023 137 100 - 199 mg/dL Final   Cholesterol Piccolo, Waived  Date Value Ref Range Status  01/24/2015 185 <200 mg/dL Final    Comment:                            Desirable                <200                         Borderline High      200- 239                         High                     >239    LDL Chol Calc (NIH)  Date Value Ref Range Status  05/26/2023 69 0 - 99 mg/dL Final   HDL  Date Value Ref Range Status  05/26/2023 46 >39 mg/dL Final   Triglycerides  Date Value Ref Range Status  05/26/2023 121 0 - 149 mg/dL Final   Triglycerides Piccolo,Waived  Date Value Ref Range Status  01/24/2015 133 <150 mg/dL Final    Comment:                            Normal                   <150                         Borderline High     150 - 199                         High                200 - 499                         Very High                >499          Passed - Cr in normal range and within 360 days    Creatinine, Ser  Date Value Ref  Range Status  05/26/2023 0.70 0.57 - 1.00 mg/dL Final         Passed - Patient is not  pregnant      Passed - Valid encounter within last 12 months    Recent Outpatient Visits           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

## 2023-12-08 NOTE — Progress Notes (Signed)
 Tabitha Larson                                          MRN: [id number]   12/08/2023   The [facility]I Quality Team Specialist reviewed this patient medical record for the purposes of chart review for care gap closure. The following were reviewed: chart review for care gap closure-controlling blood pressure.    VBCI Quality Team

## 2023-12-13 NOTE — Patient Instructions (Signed)
 Be Involved in Caring For Your Health:  Taking Medications When medications are taken as directed, they can greatly improve your health. But if they are not taken as prescribed, they may not work. In some cases, not taking them correctly can be harmful. To help ensure your treatment remains effective and safe, understand your medications and how to take them. Bring your medications to each visit for review by your provider.  Your lab results, notes, and after visit summary will be available on My Chart. We strongly encourage you to use this feature. If lab results are abnormal the clinic will contact you with the appropriate steps. If the clinic does not contact you assume the results are satisfactory. You can always view your results on My Chart. If you have questions regarding your health or results, please contact the clinic during office hours. You can also ask questions on My Chart.  We at [facility] are grateful that you chose [place] to provide your care. We strive to provide evidence-based and compassionate care and are always looking for feedback. If you get a survey from the clinic please complete this so we can hear your opinions.  Prediabetes: What to Know Prediabetes is when your blood sugar, also called glucose, is at a higher level than normal but not high enough for you to be diagnosed with type 2 diabetes (type 2 diabetes mellitus). Having prediabetes puts you at risk for getting type 2 diabetes. By making some healthy changes, you may be able to prevent or delay getting type 2 diabetes. This is important because type 2 diabetes can lead to serious problems. Some of these include: Heart disease. Stroke. Blindness. Kidney disease. Depression. Poor blood flow in the feet and legs. In very bad cases, this could lead to having a leg removed by surgery (amputation). What are the causes? The exact cause of prediabetes isn't known. It may result from insulin resistance. Insulin  resistance happens when cells in the body don't respond properly to insulin that the body makes. This can cause too much sugar to build up in the blood. High blood sugar, also called hyperglycemia, can develop. What increases the risk? Having a family member with type 2 diabetes. Being older than 75 years of age. Having had a temporary form of diabetes during a pregnancy. This is called gestational diabetes. Having had polycystic ovary syndrome (PCOS). Being overweight or obese. Being inactive and not getting much exercise. Having a history of heart disease. This may include problems with cholesterol levels, high levels of blood fats, or high blood pressure. What are the signs or symptoms? You may have no symptoms. If you do have symptoms, they may include: Increased hunger. Increased thirst. Needing to pee more often. Changes in how you see, like blurry vision. Feeling tired. How is this diagnosed? Prediabetes can be diagnosed with blood tests that check your blood sugar. One or more of these tests may be done: A fasting blood glucose (FBG) test. You won't be allowed to eat (you will fast) for at least 8 hours before a blood sample is taken. An A1C blood test, also called a hemoglobin A1C test. This test shows information about blood sugar levels over the past 2?3 months. An oral glucose tolerance test (OGTT). This test measures your blood sugar at two points in time: After you haven't eaten for a while. This is your baseline level. Two hours after you drink a beverage that has sugar in it. You may be diagnosed with prediabetes  if: Your FBG is 100?125 mg/dL (1.6-1.0 mmol/L). Your A1C level is 5.7?6.4% (39-46 mmol/mol). Your OGTT result is 140?199 mg/dL (9.6-04 mmol/L). These blood tests may need to be done again to be sure of the diagnosis. How is this treated? Treatment may include making changes to your diet and lifestyle. These changes can help lower your blood sugar and keep you  from getting type 2 diabetes. In some cases, medicine may be given to help lower your risk. Follow these instructions at home: Eating and drinking  Eat and drink as told. Follow a healthy meal plan. This includes eating lean proteins, whole grains, legumes, fresh fruits and vegetables, low-fat dairy products, and healthy fats. Meet with an expert in healthy eating called a dietitian. This person can help create a healthy eating plan that's right for you. Lifestyle Do moderate-intensity exercise. Do this for at least 30 minutes a day on 5 or more days each week, or as told by your health care provider. A mix of activities may be best. Good choices include brisk walking, swimming, biking, and weight lifting. Try to lose weight if your provider says it's OK. Losing 5-7% of your body weight can help reverse insulin resistance. Do not drink alcohol if: Your provider tells you not to drink. You're pregnant, may be pregnant, or plan to become pregnant. If you drink alcohol: Limit how much you have to: 0-1 drink a day if you're female. 0-2 drinks a day if you're female. Know how much alcohol is in your drink. In the [place] one drink is one 12 oz bottle of beer (355 mL), one 5 oz glass of wine (148 mL), or one 1 oz glass of hard liquor (44 mL). General instructions Take medicines only as told. You may be given medicines that help lower the risk of type 2 diabetes. Do not smoke, vape, or use nicotine or tobacco. Where to find more information American Diabetes Association: diabetes.org/about-diabetes/prediabetes Academy of Nutrition and Dietetics: eatright.org American Heart Association: Go to ThisJobs.cz. Click the search icon. Type "prediabetes" in the search box. Contact a health care provider if: You have any of these symptoms: Increased hunger. Peeing more often than usual. Increased thirst. Feeling tired. Changes in how you see, like blurry vision. Feeling like you may throw  up. Throwing up. Get help right away if: You have shortness of breath. You feel confused. This information is not intended to replace advice given to you by your health care provider. Make sure you discuss any questions you have with your health care provider. Document Revised: 09/22/2022 Document Reviewed: 09/22/2022 Elsevier Patient Education  2024 [employer].

## 2023-12-16 ENCOUNTER — Ambulatory Visit: Admitting: Nurse Practitioner

## 2023-12-16 ENCOUNTER — Encounter: Payer: Self-pay | Admitting: Nurse Practitioner

## 2023-12-16 VITALS — BP 126/74 | HR 73 | Temp 98.1°F | Resp 14 | Ht 62.01 in | Wt 148.6 lb

## 2023-12-16 DIAGNOSIS — I25118 Atherosclerotic heart disease of native coronary artery with other forms of angina pectoris: Secondary | ICD-10-CM

## 2023-12-16 DIAGNOSIS — I10 Essential (primary) hypertension: Secondary | ICD-10-CM | POA: Diagnosis not present

## 2023-12-16 DIAGNOSIS — Z23 Encounter for immunization: Secondary | ICD-10-CM | POA: Diagnosis not present

## 2023-12-16 DIAGNOSIS — R7309 Other abnormal glucose: Secondary | ICD-10-CM | POA: Diagnosis not present

## 2023-12-16 DIAGNOSIS — G63 Polyneuropathy in diseases classified elsewhere: Secondary | ICD-10-CM | POA: Diagnosis not present

## 2023-12-16 DIAGNOSIS — F332 Major depressive disorder, recurrent severe without psychotic features: Secondary | ICD-10-CM

## 2023-12-16 DIAGNOSIS — E7800 Pure hypercholesterolemia, unspecified: Secondary | ICD-10-CM | POA: Diagnosis not present

## 2023-12-16 DIAGNOSIS — E538 Deficiency of other specified B group vitamins: Secondary | ICD-10-CM | POA: Diagnosis not present

## 2023-12-16 LAB — BAYER DCA HB A1C WAIVED: HB A1C (BAYER DCA - WAIVED): 6.1 % — ABNORMAL HIGH (ref 4.8–5.6)

## 2023-12-16 MED ORDER — ALENDRONATE SODIUM 70 MG PO TABS: 70.0000 mg | ORAL_TABLET | ORAL | 11 refills | Status: AC

## 2023-12-16 MED ORDER — EZETIMIBE 10 MG PO TABS: 10.0000 mg | ORAL_TABLET | Freq: Every day | ORAL | 3 refills | Status: AC

## 2023-12-16 MED ORDER — PANTOPRAZOLE SODIUM 20 MG PO TBEC: 20.0000 mg | DELAYED_RELEASE_TABLET | Freq: Every day | ORAL | 4 refills | Status: AC

## 2023-12-16 MED ORDER — CLOPIDOGREL BISULFATE 75 MG PO TABS: 75.0000 mg | ORAL_TABLET | Freq: Every day | ORAL | 3 refills | Status: AC

## 2023-12-16 MED ORDER — ROSUVASTATIN CALCIUM 40 MG PO TABS: 40.0000 mg | ORAL_TABLET | Freq: Every day | ORAL | 4 refills | Status: AC

## 2023-12-16 MED ORDER — BUSPIRONE HCL 5 MG PO TABS: 5.0000 mg | ORAL_TABLET | Freq: Two times a day (BID) | ORAL | 5 refills | Status: AC | PRN

## 2023-12-16 MED ORDER — DULOXETINE HCL 60 MG PO CPEP: 60.0000 mg | ORAL_CAPSULE | Freq: Every day | ORAL | 4 refills | Status: AC

## 2023-12-16 NOTE — Assessment & Plan Note (Signed)
 Ongoing with A1c trend down from 6.2% to 6.1% today.  Recommend heavy focus on reduction of sweets and focus on regular exercise routine.  Start medication in future as needed.
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
## 2023-12-16 NOTE — Assessment & Plan Note (Signed)
 Chronic, ongoing.  Continue current medication regimen and adjust as needed.  Lipid panel and CMP today.

## 2023-12-16 NOTE — Assessment & Plan Note (Signed)
 Ongoing. Has had MRI, carotid u/s, and multiple labs to rule our tick borne disease and other disease processes, all have been reassuring.  ?fibromyalgia vs other neurologic condition presenting.  Continue Duloxetine  and B12 supplement at this time + collaboration with neurology as needed.  Labs today. Has not been taking B12 consistently.

## 2023-12-16 NOTE — Assessment & Plan Note (Signed)
 Chronic, stable. No recent CP.  Continue collaboration with cardiology and current medications.

## 2023-12-16 NOTE — Assessment & Plan Note (Signed)
 Chronic, stable.  BP at goal today.  Continue current diet control. Discussed at length with patient.  Recommend she monitor BP at least a few mornings a week at home and document for provider.  DASH diet at home.  Labs today: CMP.  Urine ALB 150 March 2025, may benefit ACE or ARB in future.

## 2023-12-16 NOTE — Assessment & Plan Note (Signed)
 Chronic, ongoing.  Denies SI/HI.  Will continue Cymbalta  as is tolerating and mood stable -- ?fibromyalgia vs neuropathy.  Noticing improvement in discomfort with Cymbalta  on board. Add on Buspar to take as needed for caregiver stressors.  Educated her on this and side effects to monitor for.

## 2023-12-17 ENCOUNTER — Ambulatory Visit: Payer: Self-pay | Admitting: Nurse Practitioner

## 2023-12-17 LAB — COMPREHENSIVE METABOLIC PANEL WITH GFR
ALT: 15 IU/L (ref 0–32)
AST: 16 IU/L (ref 0–40)
Albumin: 4.3 g/dL (ref 3.8–4.8)
Alkaline Phosphatase: 74 IU/L (ref 49–135)
BUN/Creatinine Ratio: 19 (ref 12–28)
BUN: 16 mg/dL (ref 8–27)
Bilirubin Total: 0.4 mg/dL (ref 0.0–1.2)
CO2: 21 mmol/L (ref 20–29)
Calcium: 9.2 mg/dL (ref 8.7–10.3)
Chloride: 101 mmol/L (ref 96–106)
Creatinine, Ser: 0.84 mg/dL (ref 0.57–1.00)
Globulin, Total: 2.6 g/dL (ref 1.5–4.5)
Glucose: 123 mg/dL — ABNORMAL HIGH (ref 70–99)
Potassium: 4 mmol/L (ref 3.5–5.2)
Sodium: 138 mmol/L (ref 134–144)
Total Protein: 6.9 g/dL (ref 6.0–8.5)
eGFR: 72 mL/min/1.73 (ref >59)

## 2023-12-17 LAB — VITAMIN B12: Vitamin B-12: 270 pg/mL (ref 232–1245)

## 2023-12-17 LAB — LIPID PANEL W/O CHOL/HDL RATIO
Cholesterol, Total: 142 mg/dL (ref 100–199)
HDL: 43 mg/dL (ref >39)
LDL Chol Calc (NIH): 81 mg/dL (ref 0–99)
Triglycerides: 99 mg/dL (ref 0–149)
VLDL Cholesterol Cal: 18 mg/dL (ref 5–40)

## 2023-12-17 NOTE — Progress Notes (Signed)
 Please let Elaria know her labs have returned and are overall stable. Continue all current medications and please restart taking Vitamin B12 1000 MCG daily as level is a little low on check.  Any questions? Keep being amazing!!  Thank you for allowing me to participate in your care.  I appreciate you. Kindest regards, Aricela Bertagnolli

## 2024-01-03 NOTE — Patient Instructions (Incomplete)

## 2024-01-06 ENCOUNTER — Ambulatory Visit: Admitting: Nurse Practitioner

## 2024-01-07 ENCOUNTER — Ambulatory Visit: Payer: Self-pay

## 2024-01-07 NOTE — Telephone Encounter (Signed)
 Summary: Cough, seeking appt today. Nothing available   Reason for Triage: Cough, can't sleep, concerned she could have pneumonia. No appt available soon enough, pt wants to be seen today. Please advise  Best contact: [phone]          Nurse attempted to call pt: no answer: left voicemail and asked pt to return call: will attempt to reach out to pt again.

## 2024-01-07 NOTE — Telephone Encounter (Signed)
 Noted

## 2024-01-07 NOTE — Telephone Encounter (Signed)
 FYI Only or Action Required?: FYI only for provider: appointment scheduled on 01/09/2024 at 11:20 AM.  Patient was last seen in primary care on 12/16/2023 by Valerio Melanie DASEN, NP.  Called Nurse Triage reporting Cough.  Symptoms began several weeks ago.  Interventions attempted: Rest, hydration, or home remedies.  Symptoms are: unchanged.  Triage Disposition: See PCP When Office is Open (Within 3 Days)  Patient/caregiver understands and will follow disposition?: Yes  Reason for Triage: Cough, can't sleep, concerned she could have pneumonia. No appt available soon enough, pt wants to be seen today. Please advise   Best contact: [phone]    Reason for Disposition  Cough has been present for > 3 weeks  Answer Assessment - Initial Assessment Questions Patient reports chronic cough that worsened after getting a cold last week. Patient states cough is productive with green sputum. No shortness of breath or chest pain. Patient is asking to be seen in the office. Scheduled patient to be seen on 01/09/2024 at 11:20 AM in PCP office.   1. ONSET: When did the cough begin?      Cough has been going on for several weeks but worsened over the last couple of days.  2. SEVERITY: How bad is the cough today?      Mild to moderate during the day but severe at night 3. SPUTUM: Describe the color of your sputum (e.g., none, dry cough; clear, white, yellow, green)     green 4. HEMOPTYSIS: Are you coughing up any blood? If Yes, ask: How much? (e.g., flecks, streaks, tablespoons, etc.)     no 5. DIFFICULTY BREATHING: Are you having difficulty breathing? If Yes, ask: How bad is it? (e.g., mild, moderate, severe)      no 6. FEVER: Do you have a fever? If Yes, ask: What is your temperature, how was it measured, and when did it start?     no 7. CARDIAC HISTORY: Do you have any history of heart disease? (e.g., heart attack, congestive heart failure)      yes 8. LUNG HISTORY: Do you  have any history of lung disease?  (e.g., pulmonary embolus, asthma, emphysema)     no 9. PE RISK FACTORS: Do you have a history of blood clots? (or: recent major surgery, recent prolonged travel, bedridden)     no 10. OTHER SYMPTOMS: Do you have any other symptoms? (e.g., runny nose, wheezing, chest pain)       Runny nose, wheezing 11. TRAVEL: Have you traveled out of the country in the last month? (e.g., travel history, exposures)       no  Protocols used: Cough - Acute Productive-A-AH

## 2024-01-09 ENCOUNTER — Encounter: Payer: Self-pay | Admitting: Nurse Practitioner

## 2024-01-09 ENCOUNTER — Ambulatory Visit: Admitting: Nurse Practitioner

## 2024-01-09 VITALS — BP 128/77 | HR 77 | Temp 98.1°F | Ht 62.0 in | Wt 149.2 lb

## 2024-01-09 DIAGNOSIS — J069 Acute upper respiratory infection, unspecified: Secondary | ICD-10-CM

## 2024-01-09 NOTE — Progress Notes (Signed)
 BP 128/77   Pulse 77   Temp 98.1 F (36.7 C) (Oral)   Ht 5' 2 (1.575 m)   Wt 149 lb 3.2 oz (67.7 kg)   LMP  (LMP Unknown)   SpO2 98%   BMI 27.29 kg/m    Subjective:    Patient ID: Tabitha Larson, female    DOB: Feb 09, 1951, 73 y.o.   MRN: [id number]  HPI: Tabitha Larson is a 73 y.o. female  Chief Complaint  Patient presents with   URI    Patient states she has been having a cough, congestion, sinus pressure, and scratchy throat for the last week. States she is feeling better today but still wanted to be checked out.   UPPER RESPIRATORY TRACT INFECTION Worst symptom: symptoms have been going on for about a week Fever: Thinks she had a fever but is gone now.  Didn't check it at home Cough: yes Shortness of breath: no Wheezing: yes- improved now Chest pain: no Chest tightness: no Chest congestion: no Nasal congestion: yes Runny nose: yes Post nasal drip: yes Sneezing: yes Sore throat: no Swollen glands: no Sinus pressure: no Headache: no Face pain: no Toothache: no Ear pain: no bilateral Ear pressure: no bilateral Eyes red/itching:yes Eye drainage/crusting: no  Vomiting: no Rash: no Fatigue: yes Sick contacts: no Strep contacts: no  Context: better Recurrent sinusitis: no Relief with OTC cold/cough medications: yes  Treatments attempted: cold/sinus   Relevant past medical, surgical, family and social history reviewed and updated as indicated. Interim medical history since our last visit reviewed. Allergies and medications reviewed and updated.  Review of Systems  Constitutional:  Positive for fatigue. Negative for fever.  HENT:  Positive for congestion, postnasal drip, rhinorrhea, sneezing and sore throat. Negative for dental problem, ear pain, sinus pressure and sinus pain.   Respiratory:  Positive for cough and wheezing. Negative for shortness of breath.   Cardiovascular:  Negative for chest pain.  Gastrointestinal:  Negative for vomiting.  Skin:   Negative for rash.  Neurological:  Negative for headaches.    Per HPI unless specifically indicated above     Objective:    BP 128/77   Pulse 77   Temp 98.1 F (36.7 C) (Oral)   Ht 5' 2 (1.575 m)   Wt 149 lb 3.2 oz (67.7 kg)   LMP  (LMP Unknown)   SpO2 98%   BMI 27.29 kg/m   Wt Readings from Last 3 Encounters:  01/09/24 149 lb 3.2 oz (67.7 kg)  12/16/23 148 lb 9.6 oz (67.4 kg)  10/10/23 148 lb 6.4 oz (67.3 kg)    Physical Exam Vitals and nursing note reviewed.  Constitutional:      General: She is not in acute distress.    Appearance: Normal appearance. She is normal weight. She is not ill-appearing, toxic-appearing or diaphoretic.  HENT:     Head: Normocephalic.     Right Ear: External ear normal. A middle ear effusion is present.     Left Ear: External ear normal. A middle ear effusion is present.     Nose: Congestion and rhinorrhea present.     Right Sinus: No maxillary sinus tenderness or frontal sinus tenderness.     Left Sinus: No maxillary sinus tenderness or frontal sinus tenderness.     Mouth/Throat:     Mouth: Mucous membranes are moist.     Pharynx: Oropharynx is clear. Posterior oropharyngeal erythema present. No oropharyngeal exudate.  Eyes:     General:  Right eye: No discharge.        Left eye: No discharge.     Extraocular Movements: Extraocular movements intact.     Conjunctiva/sclera: Conjunctivae normal.     Pupils: Pupils are equal, round, and reactive to light.  Cardiovascular:     Rate and Rhythm: Normal rate and regular rhythm.     Heart sounds: No murmur heard. Pulmonary:     Effort: Pulmonary effort is normal. No respiratory distress.     Breath sounds: Normal breath sounds. No wheezing or rales.  Musculoskeletal:     Cervical back: Normal range of motion and neck supple.  Skin:    General: Skin is warm and dry.     Capillary Refill: Capillary refill takes less than 2 seconds.  Neurological:     General: No focal deficit  present.     Mental Status: She is alert and oriented to person, place, and time. Mental status is at baseline.  Psychiatric:        Mood and Affect: Mood normal.        Behavior: Behavior normal.        Thought Content: Thought content normal.        Judgment: Judgment normal.     Results for orders placed or performed in visit on 12/16/23  Bayer DCA Hb A1c Waived   Collection Time: 12/16/23 10:08 AM  Result Value Ref Range   HB A1C (BAYER DCA - WAIVED) 6.1 (H) 4.8 - 5.6 %  Lipid Panel w/o Chol/HDL Ratio   Collection Time: 12/16/23 10:08 AM  Result Value Ref Range   Cholesterol, Total 142 100 - 199 mg/dL   Triglycerides 99 0 - 149 mg/dL   HDL 43 >60 mg/dL   VLDL Cholesterol Cal 18 5 - 40 mg/dL   LDL Chol Calc (NIH) 81 0 - 99 mg/dL  Comprehensive metabolic panel with GFR   Collection Time: 12/16/23 10:08 AM  Result Value Ref Range   Glucose 123 (H) 70 - 99 mg/dL   BUN 16 8 - 27 mg/dL   Creatinine, Ser 9.15 0.57 - 1.00 mg/dL   eGFR 72 >40 fO/fpw/8.26   BUN/Creatinine Ratio 19 12 - 28   Sodium 138 134 - 144 mmol/L   Potassium 4.0 3.5 - 5.2 mmol/L   Chloride 101 96 - 106 mmol/L   CO2 21 20 - 29 mmol/L   Calcium  9.2 8.7 - 10.3 mg/dL   Total Protein 6.9 6.0 - 8.5 g/dL   Albumin 4.3 3.8 - 4.8 g/dL   Globulin, Total 2.6 1.5 - 4.5 g/dL   Bilirubin Total 0.4 0.0 - 1.2 mg/dL   Alkaline Phosphatase 74 49 - 135 IU/L   AST 16 0 - 40 IU/L   ALT 15 0 - 32 IU/L  Vitamin B12   Collection Time: 12/16/23 10:08 AM  Result Value Ref Range   Vitamin B-12 270 232 - 1,245 pg/mL      Assessment & Plan:   Problem List Items Addressed This Visit   None Visit Diagnoses       Viral upper respiratory tract infection    -  Primary   No red flags on exam requiring antibiotics. Continue with OTC symptom management. Follow up if not improved.        Follow up plan: No follow-ups on file.

## 2024-03-15 ENCOUNTER — Other Ambulatory Visit: Payer: Self-pay | Admitting: Cardiology

## 2024-04-05 ENCOUNTER — Telehealth: Payer: Self-pay

## 2024-05-27 ENCOUNTER — Encounter: Admitting: Nurse Practitioner

## 2024-08-24 ENCOUNTER — Ambulatory Visit
